# Patient Record
Sex: Male | Born: 1935
Health system: Southern US, Community
[De-identification: ages and names within clinical notes are randomized; demographics above are authoritative.]

## PROBLEM LIST (undated history)

## (undated) DIAGNOSIS — G43909 Migraine, unspecified, not intractable, without status migrainosus: Secondary | ICD-10-CM

## (undated) DIAGNOSIS — J309 Allergic rhinitis, unspecified: Secondary | ICD-10-CM

## (undated) DIAGNOSIS — Z87442 Personal history of urinary calculi: Secondary | ICD-10-CM

## (undated) DIAGNOSIS — R42 Dizziness and giddiness: Secondary | ICD-10-CM

## (undated) DIAGNOSIS — I10 Essential (primary) hypertension: Secondary | ICD-10-CM

## (undated) DIAGNOSIS — Z8719 Personal history of other diseases of the digestive system: Secondary | ICD-10-CM

## (undated) DIAGNOSIS — F329 Major depressive disorder, single episode, unspecified: Secondary | ICD-10-CM

## (undated) DIAGNOSIS — L814 Other melanin hyperpigmentation: Secondary | ICD-10-CM

## (undated) DIAGNOSIS — D229 Melanocytic nevi, unspecified: Secondary | ICD-10-CM

## (undated) DIAGNOSIS — T8859XA Other complications of anesthesia, initial encounter: Secondary | ICD-10-CM

## (undated) DIAGNOSIS — F419 Anxiety disorder, unspecified: Secondary | ICD-10-CM

## (undated) DIAGNOSIS — R350 Frequency of micturition: Secondary | ICD-10-CM

## (undated) DIAGNOSIS — F32A Depression, unspecified: Secondary | ICD-10-CM

## (undated) DIAGNOSIS — K219 Gastro-esophageal reflux disease without esophagitis: Secondary | ICD-10-CM

## (undated) DIAGNOSIS — R7989 Other specified abnormal findings of blood chemistry: Secondary | ICD-10-CM

## (undated) DIAGNOSIS — T4145XA Adverse effect of unspecified anesthetic, initial encounter: Secondary | ICD-10-CM

## (undated) DIAGNOSIS — N486 Induration penis plastica: Secondary | ICD-10-CM

## (undated) DIAGNOSIS — H409 Unspecified glaucoma: Secondary | ICD-10-CM

## (undated) DIAGNOSIS — M1990 Unspecified osteoarthritis, unspecified site: Secondary | ICD-10-CM

## (undated) DIAGNOSIS — G4700 Insomnia, unspecified: Secondary | ICD-10-CM

## (undated) DIAGNOSIS — N529 Male erectile dysfunction, unspecified: Secondary | ICD-10-CM

## (undated) DIAGNOSIS — E785 Hyperlipidemia, unspecified: Secondary | ICD-10-CM

## (undated) DIAGNOSIS — IMO0001 Reserved for inherently not codable concepts without codable children: Secondary | ICD-10-CM

## (undated) DIAGNOSIS — J189 Pneumonia, unspecified organism: Secondary | ICD-10-CM

## (undated) DIAGNOSIS — M545 Low back pain, unspecified: Secondary | ICD-10-CM

## (undated) DIAGNOSIS — E213 Hyperparathyroidism, unspecified: Secondary | ICD-10-CM

## (undated) DIAGNOSIS — J45909 Unspecified asthma, uncomplicated: Secondary | ICD-10-CM

## (undated) DIAGNOSIS — Z9289 Personal history of other medical treatment: Secondary | ICD-10-CM

## (undated) DIAGNOSIS — Z8669 Personal history of other diseases of the nervous system and sense organs: Secondary | ICD-10-CM

## (undated) DIAGNOSIS — G709 Myoneural disorder, unspecified: Secondary | ICD-10-CM

## (undated) DIAGNOSIS — Z860100 Personal history of colon polyps, unspecified: Secondary | ICD-10-CM

## (undated) DIAGNOSIS — R531 Weakness: Secondary | ICD-10-CM

## (undated) DIAGNOSIS — E119 Type 2 diabetes mellitus without complications: Secondary | ICD-10-CM

## (undated) DIAGNOSIS — G8929 Other chronic pain: Secondary | ICD-10-CM

## (undated) DIAGNOSIS — R3915 Urgency of urination: Secondary | ICD-10-CM

## (undated) DIAGNOSIS — R351 Nocturia: Secondary | ICD-10-CM

## (undated) DIAGNOSIS — G629 Polyneuropathy, unspecified: Secondary | ICD-10-CM

## (undated) DIAGNOSIS — K5900 Constipation, unspecified: Secondary | ICD-10-CM

## (undated) DIAGNOSIS — N400 Enlarged prostate without lower urinary tract symptoms: Secondary | ICD-10-CM

## (undated) HISTORY — PX: BACK SURGERY: SHX140

## (undated) HISTORY — DX: Type 2 diabetes mellitus without complications: E11.9

## (undated) HISTORY — PX: TRIGGER FINGER RELEASE: SHX641

## (undated) HISTORY — PX: COLONOSCOPY: SHX174

## (undated) HISTORY — PX: LITHOTRIPSY: SUR834

---

## 1965-10-09 DIAGNOSIS — Z8711 Personal history of peptic ulcer disease: Secondary | ICD-10-CM

## 1965-10-09 HISTORY — DX: Personal history of peptic ulcer disease: Z87.11

## 1996-10-09 HISTORY — PX: ANTERIOR CERVICAL DECOMP/DISCECTOMY FUSION: SHX1161

## 1998-05-03 ENCOUNTER — Ambulatory Visit (HOSPITAL_COMMUNITY): Admission: RE | Admit: 1998-05-03 | Discharge: 1998-05-03 | Payer: Self-pay | Admitting: Gastroenterology

## 1999-08-26 ENCOUNTER — Encounter: Payer: Self-pay | Admitting: Neurological Surgery

## 1999-08-30 ENCOUNTER — Inpatient Hospital Stay (HOSPITAL_COMMUNITY): Admission: RE | Admit: 1999-08-30 | Discharge: 1999-09-04 | Payer: Self-pay | Admitting: Neurological Surgery

## 1999-08-30 ENCOUNTER — Encounter: Payer: Self-pay | Admitting: Neurological Surgery

## 1999-09-23 ENCOUNTER — Encounter: Admission: RE | Admit: 1999-09-23 | Discharge: 1999-09-23 | Payer: Self-pay | Admitting: Neurological Surgery

## 1999-09-23 ENCOUNTER — Encounter: Payer: Self-pay | Admitting: Neurological Surgery

## 1999-10-28 ENCOUNTER — Encounter: Payer: Self-pay | Admitting: Neurological Surgery

## 1999-10-28 ENCOUNTER — Encounter: Admission: RE | Admit: 1999-10-28 | Discharge: 1999-10-28 | Payer: Self-pay | Admitting: Neurological Surgery

## 2000-02-29 ENCOUNTER — Encounter: Admission: RE | Admit: 2000-02-29 | Discharge: 2000-02-29 | Payer: Self-pay | Admitting: Neurological Surgery

## 2000-02-29 ENCOUNTER — Encounter: Payer: Self-pay | Admitting: Neurological Surgery

## 2000-06-08 ENCOUNTER — Encounter: Payer: Self-pay | Admitting: Neurological Surgery

## 2000-06-08 ENCOUNTER — Ambulatory Visit (HOSPITAL_COMMUNITY): Admission: RE | Admit: 2000-06-08 | Discharge: 2000-06-08 | Payer: Self-pay | Admitting: Neurological Surgery

## 2002-03-27 ENCOUNTER — Encounter: Admission: RE | Admit: 2002-03-27 | Discharge: 2002-03-27 | Payer: Self-pay | Admitting: Family Medicine

## 2002-03-27 ENCOUNTER — Encounter: Payer: Self-pay | Admitting: Family Medicine

## 2002-04-21 ENCOUNTER — Encounter: Admission: RE | Admit: 2002-04-21 | Discharge: 2002-04-21 | Payer: Self-pay | Admitting: Family Medicine

## 2002-04-21 ENCOUNTER — Encounter: Payer: Self-pay | Admitting: Family Medicine

## 2002-05-05 ENCOUNTER — Encounter: Admission: RE | Admit: 2002-05-05 | Discharge: 2002-05-05 | Payer: Self-pay | Admitting: Family Medicine

## 2002-05-05 ENCOUNTER — Encounter: Payer: Self-pay | Admitting: Family Medicine

## 2002-09-09 ENCOUNTER — Encounter: Payer: Self-pay | Admitting: Neurological Surgery

## 2002-09-11 ENCOUNTER — Encounter: Payer: Self-pay | Admitting: Neurological Surgery

## 2002-09-11 ENCOUNTER — Inpatient Hospital Stay (HOSPITAL_COMMUNITY): Admission: RE | Admit: 2002-09-11 | Discharge: 2002-09-15 | Payer: Self-pay | Admitting: Neurological Surgery

## 2003-07-23 ENCOUNTER — Ambulatory Visit (HOSPITAL_COMMUNITY): Admission: RE | Admit: 2003-07-23 | Discharge: 2003-07-23 | Payer: Self-pay | Admitting: Urology

## 2006-03-01 ENCOUNTER — Inpatient Hospital Stay (HOSPITAL_COMMUNITY): Admission: RE | Admit: 2006-03-01 | Discharge: 2006-03-03 | Payer: Self-pay | Admitting: Neurological Surgery

## 2006-10-09 HISTORY — PX: EYE SURGERY: SHX253

## 2007-10-10 HISTORY — PX: HEMORRHOID SURGERY: SHX153

## 2008-08-28 ENCOUNTER — Ambulatory Visit (HOSPITAL_COMMUNITY): Admission: RE | Admit: 2008-08-28 | Discharge: 2008-08-28 | Payer: Self-pay | Admitting: *Deleted

## 2008-08-28 ENCOUNTER — Encounter (INDEPENDENT_AMBULATORY_CARE_PROVIDER_SITE_OTHER): Payer: Self-pay | Admitting: *Deleted

## 2008-09-02 ENCOUNTER — Inpatient Hospital Stay (HOSPITAL_COMMUNITY): Admission: AD | Admit: 2008-09-02 | Discharge: 2008-09-04 | Payer: Self-pay

## 2008-10-09 DIAGNOSIS — Z9289 Personal history of other medical treatment: Secondary | ICD-10-CM

## 2008-10-09 HISTORY — DX: Personal history of other medical treatment: Z92.89

## 2009-12-21 ENCOUNTER — Ambulatory Visit (HOSPITAL_BASED_OUTPATIENT_CLINIC_OR_DEPARTMENT_OTHER): Admission: RE | Admit: 2009-12-21 | Discharge: 2009-12-21 | Payer: Self-pay | Admitting: Orthopedic Surgery

## 2011-02-21 NOTE — Op Note (Signed)
NAME:  Seth Schwartz, WANT NO.:  [id number]   MEDICAL RECORD NO.:  [id number]          PATIENT TYPE:  AMB   LOCATION:  DAY                          FACILITY:  [facility]   PHYSICIAN:  Alfonse Ras, MD   DATE OF BIRTH:  05/24/1936   DATE OF PROCEDURE:  08/28/2008  DATE OF DISCHARGE:                               OPERATIVE REPORT   PREOPERATIVE DIAGNOSES:  Grade 2 internal hemorrhoids with prolapse  which spontaneously reduce and perianal pain.   POSTOPERATIVE DIAGNOSIS:  Grade 2 internal hemorrhoids with prolapse  which spontaneously reduce and perianal pain with very high internal  sphincter muscle tone.   PROCEDURE:  PPH hemorrhoidectomy, rectopexy.   ANESTHESIA:  General.   SURGEON:  Alfonse Ras, MD   ASSISTANT:  Lennie Muckle, MD.   DESCRIPTION:  The patient was taken to the operating room after informed  consent was granted.  The indication, risks, benefits, and options were  explained to the patient.  He underwent general anesthesia by  endotracheal tube, and was placed in the prone jackknife position.  Rectal and perianal prep were undertaken with Betadine solution.  Dilatation was accomplished to 3 fingerbreadths, although this was quite  difficult because of the patient's high underlying sphincter tone.  Hemorrhoidal bundles were injected with 0.25 Marcaine with Wydase.  Internal and external sphincter muscles were injected with additional  0.25% Marcaine, total of 30 mL.  A 2-0 Prolene pursestring suture was  placed in mucosa and submucosa circumferentially approximately 4 to 5 cm  proximal to the dentate line.  The stapler was then introduced, and the  pursestring suture was tied down around the anvil.  It was then closed  and held in place for 1 minute.  It was fired.  The stapler was removed,  and there was a good rim of mucosa and submucosa including hemorrhoidal  tissue.  Inspecting the staple line, there was no bleeding.  However, in  the  right lateral aspect, it appeared to be only about 1/2 cm from the  dentate line.  The remainder of the staple line was 2 to 2-1/2 cm from  the dentate line.  Bupivacaine ointment was then placed, and Gelfoam  packing was placed in the rectum after adequate hemostasis was assured.  A 4 x 4s were placed.  Mesh panties were placed on the patient, and he  was taken to PACU in good condition.      Alfonse Ras, MD  Electronically Signed     KRE/MEDQ  D:  08/28/2008  T:  08/28/2008  Job:  [id number]

## 2011-02-21 NOTE — Op Note (Signed)
NAME:  Seth Schwartz, Seth Schwartz NO.:  [id number]   MEDICAL RECORD NO.:  [id number]          PATIENT TYPE:  INP   LOCATION:  5154                         FACILITY:  [facility]   PHYSICIAN:  Lennie Muckle, MD      DATE OF BIRTH:  Apr 07, 1936   DATE OF PROCEDURE:  09/02/2008  DATE OF DISCHARGE:                               OPERATIVE REPORT   PREOPERATIVE DIAGNOSIS:  Rectal bleeding.   POSTOPERATIVE DIAGNOSIS:  Rectal bleeding.   PROCEDURE:  Rectal exam under anesthesia with control of bleeding.   SURGEON:  Lennie Muckle, MD   ASSISTANT:  None.   ANESTHESIA:  General endotracheal anesthesia.   FINDINGS:  Mild oozing on staple line around 5 o'clock, secured with a 2-  0 chromic and electrocautery.   INDICATION FOR PROCEDURE:  [name] was a 75 year old male who had a  TPH performed last on August 28, 2008.  He had pain since his surgery.  He has had bleeding in the past couple of days, had unable to have a  good bowel movement secondary to pain.  He was seen in our office and  had some bleeding noted, therefore, it was discussed the patient needed  to have examination under anesthesia for the control of bleeding.  Informed consent was obtained prior to procedure.   [name] was identified in the preoperative holding area.  He was  then taken to operating room and placed in the supine position.  After  administration of general endotracheal anesthesia, Foley catheter was  placed.  He was then turned into a prone position.  His perineum was  prepped by me.  I did prep the anus.  After prepping and draping the  perineum, I placed a small anoscope into the anus.  Upon inspection,  there was a mild bit of oozing noted around 4-5 o'clock.  I was able to  fully control with electrocautery.  Therefore, I placed 2 sutures with a  2-0 chromic suture that seemed to control the oozing.  I further  inspected the staple line.  There was some breakdown approximately 3  o'clock,  which I was able to control with electrocautery.  The other  portion of the staple line appeared intact and there was no bleeding.  There was stool noted superiorly to the staple line.  I did inject 0.25%  Marcaine a total of 50 mL some directly into the portion around 5-6  o'clock, and around the perineum in all 4 quadrants.  Final inspection  revealed no significant amount of bleeding.  I therefore placed a  Gelfoam and gauze within the anus.  I Placed dibucaine ointment around  the perineum.  The patient was then turned into supine position,  extubated and transported to postanesthesia care unit in stable  condition.  He will be monitored overnight.  He was started on  Percocet and given Dilaudid for breakthrough pain.  He will be given  MiraLax, Dulcolax, and Colace to aid with stool softening.  His Foley  catheter will be discharged in the morning.  He will be likely to be  able to be discharged home tomorrow.      Lennie Muckle, MD  Electronically Signed     ALA/MEDQ  D:  09/02/2008  T:  09/03/2008  Job:  [id number]   cc:   Alfonse Ras, MD

## 2011-02-24 NOTE — H&P (Signed)
[place]. [facility]  Patient:    Seth Schwartz                      MRN: [id number] Adm. Date:  [id number] Attending:  Jonne Ply                         History and Physical  ADMISSION DIAGNOSES:  Lumbar spondylosis plus herniated nucleus pulposus with left lumbar radiculopathy.  HISTORY OF PRESENT ILLNESS:  The patient is a 75 year old individual whom I have seen and taken care of in the past.  In 1998, he had cervical spondylosis with myelopathy, which was decompressed and stabilized.  He made a good recovery and was able to return to the work place; however, over the past number of months he has had some intermittent bouts of back pain and leg pain.  I had seen him in January for difficulties with some pain radiating into his right lower extremity.  He started on some non-steroidal anti-inflammatories and underwent some conservative treatment, which he seemed to respond to.  He was then seen again in February, while he was improving.  He called me several weeks ago and was noted to have a  severe worsening of pain with weakness in his lower extremities, to the point where he could not get out of bed without great difficulty.  He had missed a number of days from work and I advised an MRI scan.  He was seen on August 16, 1999 with the study in hand and we reviewed it.  Compared to his previous study, he had a marked worsening of spondylitic degeneration at L4-5 with a central and a left-sided disk protrusion, which had enlarged considerably.  He had bilateral dorsiflexor weakness, and because of his poor level of functioning and severe pain that he as experiencing he was advised regarding surgical decompression and stabilization.  He is now admitted for this procedure.  PAST MEDICAL HISTORY:  His general health has been very good.  The only other surgery has been a hemorrhoidectomy, aside from the cervical  spondylitic surgery.  CURRENT MEDICATIONS: 1. BuSpar 5 mg three times a day for anxiety. 2. Trazodone 75 mg at bedtime for sleep. 3. Hydrocodone, which he had been using until the recent past.  ALLERGIES: 1. PENICILLIN. 2. SULFA. 3. Possibly CODEINE.  FAMILY HISTORY:  His mother is age 59 in good health.  Father is age 31 in fair  health.  There is a history of colon cancer in his mother.  SOCIAL HISTORY:  The patient is married.  He has grown children.  He works as a  Production designer, theatre/television/film at [employer].  REVIEW OF SYSTEMS:  Essentially negative for difficulties in the GI, respiratory, GU, and cardiovascular systems.  PHYSICAL EXAMINATION:  GENERAL:  He is an alert, oriented, cooperative individual in no overt distress.  NEUROLOGIC:  He will stand straight and erect with great difficulty.  He tends o favor a 10-degree forward flex.  He extends to the straight and upright position but not beyond.  Motor strength reveals bilateral tibialis anterior, extensor hallucis weakness to 4/5.  The patient complains of pain in the region of both anterior tibial muscular regions.  His gastrocnemius strength appears normal. Iliopsoas and quadriceps strength are also normal.  Deep tendon reflexes are 2+ in the patellae, trace in both Achilles.  Babinskis are downgoing.  Straight leg raising is markedly positive in either lower  extremity at 45 degrees.  Patricks  maneuver is negative bilaterally.  Palpation of his back reproduces acute tenderness over the region of L4-5 in the midline.  There is a marked amount of  paravertebral spasm.  Sensation is diminished over the dorsum of both feet to pin and vibration.  In the upper extremities, strength and reflexes are normal with 1+ biceps reflexes and trace triceps reflexes.  Cranial nerve examination reveals hat pupils are 3 mm, brisk, reactive to light and accommodation.  Extraocular movements are full.  The face is symmetric to  grimace.  Tongue and uvula are in the midline. Sclerae and conjunctivae are clear.  LUNGS:  Clear to auscultation.  HEART:  Regular rate and rhythm.  ABDOMEN:  Soft.  Bowel sounds are positive.  No masses are palpable.  EXTREMITIES:  No cyanosis, clubbing, or edema.  IMPRESSION:  The patient has evidence of spondylitic disease of the lumbar spine with a large protruding disk at L4-5.  Because of the nature of the degeneration, he has been advised that, in addition to the decompression, he should undergo a  stabilization procedure and he is now admitted for this procedure. DD:  08/30/99 TD:  08/30/99 Job: [id number]

## 2011-02-24 NOTE — Op Note (Signed)
[place]. [facility]  Patient:    FAYEZ STURGELL                      MRN: [id number] Proc. Date: 08/30/99 Adm. Date:  [id number] Attending:  Jonne Ply                           Operative Report  PREOPERATIVE DIAGNOSES:  L4-5 herniated nucleus pulposus plus spondylosis with severe lumbar radiculopathy, and bilateral foot drops.  POSTOPERATIVE DIAGNOSES:  L4-5 herniated nucleus pulposus plus spondylosis with  severe lumbar radiculopathy, and bilateral foot drops.  PROCEDURE:  L4-5 bilateral diskectomy, posterior interbody arthrodesis with Ray  cage technique, local autograft.  Bilateral lateral arthrodesis with iliac crest bone graft from separate incision and pedicular fixation with SDRS system using  spiral 90 locks.  SURGEON:  Stefani Dama, M.D.  ASSISTANT:  Danae Orleans. Venetia Maxon, M.D.  ANESTHESIA:  General endotracheal.  INDICATIONS:  The patient is a 75 year old individual who has had significant back and bilateral leg pain that started rather acutely.  He had been seen a number f months ago with an episode of back pain and some lumbar radiculopathy that resolved with conservative management; however, a little over a week ago he had an MRI scan which showed a large central herniation of the L4-5 disk which had been markedly degenerated.  He had bilateral foot drops.  He was taken to the operating room.  DESCRIPTION OF PROCEDURE:  The patient was brought to the operating room supine on the stretcher.  After a smooth induction of general endotracheal anesthesia he as turned prone.  The back was shaved and prepped with DuraPrep and draped in a sterile fashion.  A midline incision was created and carried down to the lumbar  dorsal fascia.  The fascia was opened off to the left side of the midline and the posterior superior iliac crest was identified.  The outer table of the crest was incised and the gluteus muscle was stripped  from the outer table of the ileum. A Taylor retractor was placed into the wound.  Then using a combination of small nd large osteotomes, cortical cancellus strips of bone were harvested from the posterior superior iliac crest.  The underlying cancellus bone was harvested using curved gouges.  An adequate sample of bone was obtained.  The fascia overlying he iliac crest was closed with #1 Vicryl in an interrupted fashion.  Attention was then turned back to the main portion of the surgery, where the spinous process of L4 was identified positively.  L4-5 was then dissected out in a subperiosteal fashion out to the facet joint at L4-5, and then the transverse processes of L4 are identified superiorly, and L5 inferiorly, again dissecting n a subperiosteal fashion.  The lateral gutters were then packed.  During this time  then laminotomies were created, removing the inferior margin and lamina of L4 out to and including the facet joint at L4-5.  The bone was harvested and saved for  bone graft for later usage.  The superior facets were then decorticated of any cartilaginous surfaces.  The yellow ligament was noted to be thickened and redundant, and this was taken up, uncovering the common dural tube.  The tube appeared to be dented dorsally, and by carefully dissecting along the lateral borders, a large free fragment of disk was encountered centrally.  This was resected.  The opening was clear into  the disk space, and this was then enlarged a little further with a #15 blade from the right side primarily, and a diskectomy was completed.  The disk space was markedly degenerated.  A combination of curets and rongeurs were used to evacuate the disk of a significant quantity of markedly degenerated disk material, so that a complete diskectomy was ultimately performed. A small laminar spreader was then used to separate the interlaminar space and then it was identified that an interbody  procedure could be performed by placing a 16 mm drill, and then tapped into the interspace, first on the right side.  A 16 mm x 26 mm cage was then threaded into the interspace, after the surfaces were adequately decorticated, first on the right side.  Then the procedure was completed on the  left side.  With this being performed, pedicle entry sites were chosen at L4 and L5 bilaterally.  These were marked with probes.  A localizing radiograph then identified and checked the position of the cages, and the position of the probes. Then the lateral gutters which had been previously dissected out were decorticated in a subperiosteal fashion.  A bone graft from the iliac crest that had been harvested was packed using first the cancellus bone and then the cortical cancellus strips over this.  With both lateral gutters packed, the pedicle probes were removed, and the screws were placed using 5.75 mm x 45.0 mm screws throughout L4 and L5.  The junction between the facet joint and the lateral mass was then carefully dissected out so that the screw heads at L4 would not interfere with his joint, and with the screws being placed, 45.0 mm rods were placed between the screw head.  These were contoured slightly, and then locking caps were placed in position.  A final localizing radiograph identified good position of the screws. Cages were packed with cortical cancellus bone that had been harvested from the  laminectomies.  Plastic end caps were applied.  Two fat grafts which were harvested from the subcutaneous space were then placed over each laminotomy site, to protect the L4 nerve root superiorly and the L5 nerve root inferiorly.  The area was copiously irrigated with antibiotic irrigating solution.  The lumbar dorsal fascia was then closed with #1 Vicryl and PDS in an interrupted fashion, and #2-0 Vicryl was used subcutaneously, #3-0 Vicryl subcuticularly.  A clear plastic  dressing as placed on the patients back.  The blood loss was estimated at about 600 cc. DD:  08/30/99 TD:  09/01/99 Job: [id number]

## 2011-02-24 NOTE — Op Note (Signed)
NAME:  Seth Schwartz, Seth Schwartz NO.:  [id number]   MEDICAL RECORD NO.:  [id number]                   PATIENT TYPE:  INP   LOCATION:  3172                                 FACILITY:  [facility]   PHYSICIAN:  Stefani Dama, M.D.               DATE OF BIRTH:  06/22/1936   DATE OF PROCEDURE:  09/11/2002  DATE OF DISCHARGE:                                 OPERATIVE REPORT   PREOPERATIVE DIAGNOSIS:  L3-4 herniated nucleus pulposus plus spondylosis,  lumbar radiculopathy, status post arthrodesis L4 and L5.   POSTOPERATIVE DIAGNOSIS:  L3-4 herniated nucleus pulposus plus spondylosis,  lumbar radiculopathy, status post arthrodesis L4 and L5.   OPERATION PERFORMED:  Removal of infected hardware at L4-L5.  Decompression  L3-4 with total diskectomy, posterior interbody arthrodesis using Ray cage  technique and pedicle screw fixation, L3-L4 with Synthes Clix pedicle screw  system.   SURGEON:  Stefani Dama, M.D.   ASSISTANT:  Clydene Fake, M.D.   ANESTHESIA:  General endotracheal.   INDICATIONS FOR PROCEDURE:  The patient is a 75 year old individual who  underwent lumbar fusion at the L4-L5 level secondary to degenerative changes  and radiculopathy in November 1998.  He did well in the interim.  However,  he has developed significant persistent right lower extremity pain in the  region of the quadriceps.  After careful evaluation it was noted that he had  a laterally extruded disk at the L3-L4 level. He was advised regarding  surgical decompression of that region via a posterior approach.  The patient  was advised that his previous hardware would be removed and he would undergo  a similar fusion at the level of L3-L4.   DESCRIPTION OF PROCEDURE:  The patient was brought to the operating room  supine on a stretcher.  After smooth induction of general endotracheal  anesthesia, he was turned prone.  The back was shaved, prepped with DuraPrep  and draped in a sterile  fashion.  A midline incision was created in the bed  of the previous incision and this was carried cephalad approximately 3 cm.  The lumbodorsal fascia was then dissected on either side of the midline.  The first mobile spinous process was then identified and this was identified  then as the L3-L4 interspace.  Dissection was then carried out laterally to  identify the previously placed hardware at L4-L5.  The screw caps were  identified and stripped of the fascial tissue that had grown up around them.  A self-retaining retractor was placed in the wound.  After loosening the  screw heads, the rods were removed and the individual screws were removed.  These were spiral 9 degree screws.  The arthrodesis was inspected and noted  to be fused solidly at L4-L5.  At this point then laminotomies were created  at L3-L4 removing the entirety of the inferior facet from L3.  The common  dural tube  was explored.  The lateral recess was explored.  The L4 nerve  root was identified, retracted medially along with the dura expose the disk  space at L3-L4.  A diskectomy was then performed first on the left side and  then a similar procedure was carried out on the right side.  This was done  with the help of Dr. Orbie Hurst who provided retraction of the common dural  tube and the L4 nerve root so as to protect it while the diskectomy was  underway, with him doing some of the lateral dissection and me doing the  medial dissection.  Then once the total diskectomy was completed, a  combination of curets and rongeurs was used to decorticate the surface of  the end plates adequately.  These were from the Synthes PLIF set.  A self-  retaining disk spreader was placed in the interspace to widen the opening,  then by sizing the appropriate Ray cage spacers, a 14 mm Tang could be  placed.  However, it was felt that it would put too much traction on the L4  nerve root.  Then using a free-hand approach, the interspace was  drilled  first on the left side and then tapped and then a 14 mm cage was placed  after some bone graft was placed into the lateral recesses of the disk  space.  Care was taken during the procedure to make sure that the right-  sided extraforaminal space was well decompressed of disk material and this  was the case.  With the distractor on the opposite side then, a 14 mm cage  was placed on the left side and countersunk approximately 4 mm.  The right-  sided cage was then similarly drilled and placed with bone graft being  placed into the interspace first and then the cage being placed with a  combination of the patient's own bone graft and 10 cc of Vitoss bone  expander.  The plastic end caps were applied.  This procedure was done with  the help of Dr. Phoebe Perch who provided retraction of the dura and protection of  the nerve roots while dissection and implantation of the cages was  performed.  Then we decided to place pedicle screws in the L3 vertebrae.  Pedicle entry sites were chosen at the intersection of the transverse  process and the pars.  Probes were placed and localizing radiograph  identified good position of the probes and good position of the interbody  cages.  The superior holes were then drilled and tapped with 6.2 mm taps and  40 mm screws were placed on each side.  The screw heads were reamed  appropriately and caps were applied.  Prior to placing the caps, the lateral  gutters were packed with a mixture of autograft and allograft after  decorticating the transverse processes at L3 and L4 adequately.  The caps  were applied and short rods measuring 55 mm were placed between these  pedicle screws and tightened into position in the cephalad plane.  The right  side was performed first and then the left side was similarly grasped and  fastened.  A localizing radiograph was again obtained and identified good position of the hardware and final tightening was then performed.  The  area  was then explored further and care was taken to make sure that the L3 nerve  root superiorly and the L4 nerve roots inferiorly were well decompressed.  A  small piece of fascia was placed  over the Ray cage to isolate it from the  surrounding nerve root region, then by irrigating copiously with antibiotic  irrigating solution, the wound was checked for hemostasis.  The lumbodorsal  fascia was closed with #1 Vicryl in interrupted fashion and 2-0 Vicryl was  used in the subcutaneous and subcuticular tissues.  3-0 Vicryl was used as a  final subcuticular closure and Dermabond was placed on the skin.  Blood loss  was estimated at about 1200 cc.  One unit of Cell Saver blood was returned  to the patient.                                                Stefani Dama, M.D.    Merla Riches  D:  09/11/2002  T:  09/11/2002  Job:  [id number]

## 2011-02-24 NOTE — Discharge Summary (Signed)
[place]. [facility]  Patient:    Seth Schwartz                      MRN: [id number] Adm. Date:  [id number] Disc. Date: 09/04/99 Attending:  Jonne Ply                           Discharge Summary  REASON FOR ADMISSION:  L4-5 spondylosis, herniated disk and radiculopathy.  FINAL DIAGNOSIS:  L4-5 spondylosis, herniated disk and radiculopathy.  HISTORY OF PRESENT ILLNESS:  Seth Schwartz is a 75 year old man with bilateral L5 radiculopathies with a large central L4-5 disk herniation and significant spondylosis.  He was admitted to the hospital and underwent bilateral diskectomy and posterior lumbar interbody fusion with Ray cages and supplemental fixation ith SVRS and pedicle fixation system, L4 through L5 and local bone iliac crest bone  graft for supplemental fixation.  The patient tolerated surgery well. Postoperatively he had good strength in his lower extremities.   He was mobilized gradually, initially observed in the ICU, then transferred to the regular floor. On November 25 he was still slow to be up and walking, this improved by November 26.  He was complaining of some mild dysuria and UA was sent.  He was started on ciprofloxacin 500 mg b.i.d. x 7 days and given prescriptions for Percocet and Valium.  DISCHARGE INSTRUCTIONS:  No lifting, bending, twisting or driving.  Wear brace hen up.  Call Monday for an appointment with  Dr. Danielle Dess for followup. DD:  09/04/99 TD:  09/04/99 Job: [id number]

## 2011-02-24 NOTE — Discharge Summary (Signed)
NAME:  Seth Schwartz, Seth Schwartz NO.:  [id number]   MEDICAL RECORD NO.:  [id number]          PATIENT TYPE:  INP   LOCATION:  5154                         FACILITY:  [facility]   PHYSICIAN:  Ardeth Sportsman, MD     DATE OF BIRTH:  [birth year]   DATE OF ADMISSION:  09/02/2008  DATE OF DISCHARGE:  09/04/2008                               DISCHARGE SUMMARY   CHIEF COMPLAINT AND REASON FOR ADMISSION:  [name] is a 75 year old  male patient, recently had a PPH procedure done on November 20, had  intractable pain since surgery, had developed postoperative bleeding and  inability to pass a bowel movement, was seen in the office, was noted to  have bleeding on exam.  Therefore, the patient was admitted to [facility] for examination under anesthesia to determine if additional  control bleeding was indicated.   The patient was admitted to the hospital where he was subsequently taken  to the OR by Dr. Freida Busman where he underwent a rectal exam under anesthesia  to control bleeding.  Please refer to Dr. Eliot Ford operative note for  details.  Basically, there are several areas of oozing which were  controlled with electrocautery procedure.   In the postoperative period, the patient was sent back to the general  floor.  He continue to have rectal pain but was only passing old blood  clots.  No signs of infection.  No frank red blood.  Stool softeners  were added.  He was given and anxiety medicine Valium and pain  medications were initiated.  By postop day #2 of the second procedure,  the patient reported rectal pain much better.  He was tolerating a diet  and oral pain medications, passing BMs without significant pain, and was  requesting to go home.  On rectal exam, he had mild perirectal  excoriation, otherwise rectal exam was normal.   DISCHARGE DIAGNOSES:  1. Postoperative rectal oozing and pain after PPH procedure.  2. Status post rectal exam under anesthesia with electrocautery   control of rectal oozing.   DISCHARGE MEDICATIONS:  The patient will resume the following home  medications:  1. Trazodone 100 mg at bedtime.  2. Omeprazole 20 mg daily.  3. Zocor 40 mg daily.  4. Lisinopril 5 mg daily.  5. Depo-Testosterone  250 mg monthly injectable.  6. Hydrocodone to be stopped.  7. Diazepam 5 mg 1-2 tablets as needed p.r.n. anxiety.  8. Cyclobenzaprine 10 mg p.r.n. if necessary take 10 mg daily.   NEW MEDICATIONS:  1. Metamucil as you were taking, before this is over-the-counter      medicine.  2. Over-the-counter stool softener 3 times daily, decrease to 2 times      daily if you have loose stools.  If still passing loose stools      after that, change in dosage over 3-day period, decreased to once      daily.  If still having loose stools, stop.  3. Percocet 5/325 one to two tablets every 4 hours as needed for pain.  4. Naprosyn 500 mg b.i.d. for pain.  OTHER DISCHARGE INSTRUCTIONS:  Diet; no restrictions.  Wound care; sitz  bath or shower 2-4 times daily.  Use baby wipes on the rectum.  Activity; no restrictions.   FOLLOWUP APPOINTMENTS:  You need to keep your previously scheduled  appointment with Dr. Colin Benton, your initial operative physician.      Allison L. Eliott Nine, MD  Electronically Signed    ALE/MEDQ  D:  10/14/2008  T:  10/15/2008  Job:  [id number]   cc:   Lennie Muckle, MD  Alfonse Ras, MD

## 2011-02-24 NOTE — Op Note (Signed)
NAME:  Seth Schwartz, Seth Schwartz NO.:  [id number]   MEDICAL RECORD NO.:  [id number]          PATIENT TYPE:  INP   LOCATION:  2899                         FACILITY:  [facility]   PHYSICIAN:  Stefani Dama, M.D.  DATE OF BIRTH:  1935-11-22   DATE OF PROCEDURE:  03/01/2006  DATE OF DISCHARGE:                                 OPERATIVE REPORT   PREOPERATIVE DIAGNOSIS:  Lumbar spondylosis, herniated nucleus pulposus with  spondylolisthesis, L2-3,lumbar radiculopathy.   POSTOPERATIVE DIAGNOSIS:  Lumbar spondylosis, herniated nucleus pulposus  with spondylolisthesis, L2-3,lumbar radiculopathy.   PROCEDURE:  Decompression L2-3 with bilateral diskectomy, posterior  interbody arthrodesis using PEEK spacers, pedicle screw fixation L2-L3,  removal of hardware L3-L4.   SURGEON:  Stefani Dama, M.D.   FIRST ASSISTANT:  Dr. Trey Sailors   ANESTHESIA:  General endotracheal.   INDICATIONS:  Seth Schwartz is a 75 year old individual who has had  significant back and bilateral lower extremity pain that is worse in the  past several months and has degenerative disk disease at the L2-L3 level  with a central herniation, spondylolisthesis and bilateral nerve root  compromise.  He has been advised regarding surgical decompression.   PROCEDURE:  The patient was brought to the operating room supine on the  stretcher, after smooth induction of general endotracheal anesthesia he was  turned prone.  The back was prepped with DuraPrep and draped in sterile  fashion.  Midline incision was created and carried down to the lumbodorsal  fascia.  The fascia was opened on either side of midline to expose  previously placed hardware.  The screws were removed at L3 and L4 and the  intervening rods were also removed.  The area was explored and then  bilateral laminotomies were created removing the inferior margin of lamina  of L2 out to and including the entirety of the facet.  The facet was then  explored  and the superior portion of naked facet at L3 was then  decorticated.  Lateral dissection was taken out to expose common dural tube  and the nerve root was noted to be significantly fibrosed to the underlying  herniated disk.  This was noted to have occurred bilaterally.  With gentle  dissection using loupe dissection, the root could be mobilized and so could  the common dural tube and diskectomy was then undertaken removing the  entirety of disk at L2-3 using combination of Kerrison rongeurs.  The  endplates were then rongeured down and the disk shapers were used to smooth  out the endplates of the disk space and ultimately an 11 mm PEEK cage could  be placed into the interspace after it was packed with the patient's  autologous bone that was mixed with allograft and Osteocel material.  The  disk space was then fully packed with this material.  Then using  fluoroscopy, pedicle entry sites were chosen at the L2 vertebra, 6.2 x 45 mm  pedicle screws were placed in L2, 6.25 x 45 mm pedicle screws were placed in  L3.  Short rods were used to connect these.  Final localizing radiographs  were  obtained in the AP lateral injection, identified good position and  construct.  Then by decorticating the posterolateral aspects over the naked  facets and the intertransverse space at L2, this area was packed with bone  graft that remained including the Osteocel material.  The system was then  torqued down into final locking position with neutral position being chosen.  The bone graft was then packed laterally and  the wound was checked for hemostasis.  Lumbodorsal fascia was then closed  with #1 Vicryl, 2-0 Vicryl was used in subcutaneous tissues, 3-0 Vicryl  subcuticularly and Dermabond on the skin.  The patient tolerated procedure  well and was returned to recovery in stable condition.      Stefani Dama, M.D.  Electronically Signed     HJE/MEDQ  D:  03/01/2006  T:  03/02/2006  Job:  [id number]

## 2011-02-24 NOTE — H&P (Signed)
NAME:  Seth, Schwartz NO.:  [id number]   MEDICAL RECORD NO.:  [id number]          PATIENT TYPE:  INP   LOCATION:  2899                         FACILITY:  [facility]   PHYSICIAN:  Stefani Dama, M.D.  DATE OF BIRTH:  1936-08-29   DATE OF ADMISSION:  03/01/2006  DATE OF DISCHARGE:                                HISTORY & PHYSICAL   ADMISSION DIAGNOSIS:  Lumbar spondylosis L2-L3 with lumbar radiculopathy,  degenerative spondylolisthesis.   HISTORY OF PRESENT ILLNESS:  Seth Schwartz is a 75 year old individual who  has had previous problems with his back having had a arthrodesis at L4 to  sacrum initially and then having degenerated the L3-L4 level.  He underwent  arthrodesis at that level several years ago.  He did well after that  surgery, but here in the past several months, he has developed increasing  pain is back, radiation into his proximal lower extremities.  Plain  radiographs were performed the office in this demonstrated that he had  developed marked degeneration of the L2-L3 level with a degenerative  listhesis.  The patient was treated initially with some epidural steroids.  He does not previously respond well to these and he desired to proceed with  surgical intervention at the earliest convenience.  He had spondylitic  stenosis at the L2-L3 level.  L1-L2 above this level appears to be quite  healthy.  I discussed with him the surgical options again, the patient again  desires to proceed with surgical intervention.  He is now being admitted for  this procedure.   PAST MEDICAL HISTORY:  His general health has been good and has had some  other surgical procedures including lumbar decompression and fusion L4 to  sacrum and L3-L4 more recently.  He has had some hemorrhoidectomy in the  past.   MEDICATIONS:  BuSpar and trazodone for anxiety at night.  He has been using  hydrocodone for pain control.   ALLERGIES:  PENICILLIN, SULFA and possibly  CODEINE.   FAMILY HISTORY:  Reveals his mother is well into her 6s.  They had been in  good health without any significant medical problems.   SOCIAL HISTORY:  The patient is married.  He was retired from [employer].   REVIEW OF SYSTEMS:  A systems review is notable for a symptoms of back pain  primarily.  He denies any significant symptoms and weakness in the lower  extremity.  Bowel and bladder control has not been affected.  His  hematologic, neurologic, pulmonary, GI, musculoskeletal systems otherwise  has been a stable and the patient does have moderate amount of a anxiety.   PHYSICAL EXAMINATION:  GENERAL:  He is alert, oriented, cooperative  individual in no obvious distress.  NEUROLOGIC:  Range of motion in his back reveals that he can flex for only  45 degrees.  He extends 10 degrees.  Motor strength has been good in  iliopsoas, quad, tibialis anterior and gastroc.  Deep tendon reflexes are 2+  in the patellae, 2+ the Achilles.  Babinski's are downgoing.  Sensation is  intact in the distal lower  extremities to pin and light touch.  Vibratory  sensation is intact. The upper extremity strength is good in the deltoids,  biceps, triceps, grips and intrinsics.  The neck reveals normal range of  motion.  Upper extremity strength is normal.  Reflexes are 2+ in the biceps  and triceps, 1+ in the brachioradialis.  Sensation is intact in upper  extremities.  Cranial nerve examination reveals the pupils are 4 mm, briskly  reactive to light and accommodation.  Extraocular movements are full.  Face  symmetric to grimace.  Tongue and uvula midline. Sclerae and conjunctivae  are clear.  LUNGS:  Lungs are clear to auscultation.  CARDIOVASCULAR:  Heart is regular rate and rhythm.  ABDOMEN:  Soft.  Bowel sounds positive.  No masses are palpable.  EXTREMITIES:  Reveal no cyanosis, clubbing or edema.   IMPRESSION:  The patient has evidence of spondylitic degeneration at the L2-   L3 level.  He is now being admitted to undergo surgical decompression at  that level.      Stefani Dama, M.D.  Electronically Signed     HJE/MEDQ  D:  03/01/2006  T:  03/01/2006  Job:  [id number]

## 2011-02-24 NOTE — Discharge Summary (Signed)
   NAME:  Seth Schwartz, Seth Schwartz NO.:  [id number]   MEDICAL RECORD NO.:  [id number]                   PATIENT TYPE:  INP   LOCATION:  3007                                 FACILITY:  [facility]   PHYSICIAN:  Stefani Dama, M.D.               DATE OF BIRTH:  [birth year]   DATE OF ADMISSION:  09/11/2002  DATE OF DISCHARGE:  09/15/2002                                 DISCHARGE SUMMARY   ADMISSION DIAGNOSES:  1. Herniated nucleus pulposus L3-4.  2. Spondylosis.  3. Lumbar radiculopathy.  4. Status post arthrodesis L4-L5.   DISCHARGE DIAGNOSES:  1. Herniated nucleus pulposus L3-4.  2. Spondylosis.  3. Lumbar radiculopathy.  4. Status post arthrodesis L4-L5.  5. Urinary retention.   CONDITION ON DISCHARGE:  Improving.   HOSPITAL COURSE:  The patient is a 75 year old individual who had lumbar  fusion at L4-L5 secondary to degenerative processes in November 1998.  He  did well from that surgery; however, over the past month, he developed a  left lumbar radiculopathy seemingly at L3 distribution.  He was found to  have a large extraforaminal disk herniation at L3-4 with some degenerative  spondylitic changes.  He is advised, in addition to surgical decompression  of this area in the extraforaminal space, he requires stabilization.  He was  taken to the operating room on 09/11/2002 and underwent posterior interbody  arthrodesis after undergoing a total diskectomy and Ray cage arthrodesis.   His postoperative course has been fairly smooth.  He had a Foley placed  because of previous history of urinary retention.  This did not seem to be a  problem after the Foley catheter was removed.  He has been ambulatory and  functioning independently.  His bowels have not moved as of this time, but  he feels ready to go home.  I will see him in the office in two to three  weeks' time.  His incision is clean and dry.                                                Stefani Dama, M.D.    Merla Riches  D:  09/15/2002  T:  09/15/2002  Job:  [id number]

## 2011-07-11 LAB — BASIC METABOLIC PANEL
BUN: 14
BUN: 18
CO2: 29
CO2: 32
Calcium: 10.1
Calcium: 10.2
Chloride: 107
Chloride: 107
Creatinine, Ser: 1
Creatinine, Ser: 1.1
GFR calc Af Amer: >60
GFR calc Af Amer: >60
GFR calc non Af Amer: >60
GFR calc non Af Amer: >60
Glucose, Bld: 90
Glucose, Bld: 115 — ABNORMAL HIGH
Potassium: 4.2
Potassium: 5.2 — ABNORMAL HIGH
Sodium: 142
Sodium: 144

## 2011-07-11 LAB — CBC
HCT: 45.3
HCT: 49.5
Hemoglobin: 15.1
Hemoglobin: 16.7
MCHC: 33.2
MCHC: 33.8
MCV: 91.5
MCV: 91.6
Platelets: 180
Platelets: 199
RBC: 4.95
RBC: 5.4
RDW: 13.1
RDW: 13.3
WBC: 7.5
WBC: 6

## 2012-03-14 ENCOUNTER — Other Ambulatory Visit: Payer: Self-pay | Admitting: Family Medicine

## 2012-03-14 DIAGNOSIS — M5416 Radiculopathy, lumbar region: Secondary | ICD-10-CM

## 2012-03-18 ENCOUNTER — Ambulatory Visit
Admission: RE | Admit: 2012-03-18 | Discharge: 2012-03-18 | Disposition: A | Discharge status: Home / Self Care | Payer: Medicare Other | Source: Ambulatory Visit | Attending: Family Medicine | Admitting: Family Medicine

## 2012-03-18 DIAGNOSIS — M5416 Radiculopathy, lumbar region: Secondary | ICD-10-CM

## 2012-03-18 MED ORDER — GADOBENATE DIMEGLUMINE 529 MG/ML IV SOLN
18.0000 mL | Freq: Once | INTRAVENOUS | Status: AC | PRN
  Administered 2012-03-18: 18 mL via INTRAVENOUS

## 2012-05-16 ENCOUNTER — Other Ambulatory Visit: Payer: Self-pay | Admitting: Neurological Surgery

## 2012-06-04 ENCOUNTER — Encounter (HOSPITAL_COMMUNITY): Payer: Self-pay | Admitting: Respiratory Therapy

## 2012-06-11 NOTE — Pre-Procedure Instructions (Signed)
20 Seth Schwartz  06/11/2012   Your procedure is scheduled on:  Tues, Sept 10 @ 10:25 AM  Report to [facility] at 7:15  AM.  Call this number if you have problems the morning of surgery: [phone]   Remember:   Do not eat food:After Midnight.  Take these medicines the morning of surgery with A SIP OF WATER: Diazepam(Valium),Pain Pill(if needed),and Tramadol(Ultram)   Do not wear jewelry  Do not wear lotions, powders, or colognes  Men may shave face and neck.  Do not bring valuables to the hospital.  Contacts, dentures or bridgework may not be worn into surgery.  Leave suitcase in the car. After surgery it may be brought to your room.  For patients admitted to the hospital, checkout time is 11:00 AM the day of discharge.   Patients discharged the day of surgery will not be allowed to drive home.    Special Instructions: CHG Shower Use Special Wash: 1/2 bottle night before surgery and 1/2 bottle morning of surgery.   Please read over the following fact sheets that you were given: Pain Booklet, Coughing and Deep Breathing, Blood Transfusion Information, MRSA Information and Surgical Site Infection Prevention

## 2012-06-12 ENCOUNTER — Encounter (HOSPITAL_COMMUNITY)
Admission: RE | Admit: 2012-06-12 | Discharge: 2012-06-12 | Disposition: A | Discharge status: Home / Self Care | Payer: Medicare Other | Source: Ambulatory Visit | Attending: Neurological Surgery | Admitting: Neurological Surgery

## 2012-06-12 ENCOUNTER — Ambulatory Visit (HOSPITAL_COMMUNITY)
Admission: RE | Admit: 2012-06-12 | Discharge: 2012-06-12 | Disposition: A | Discharge status: Home / Self Care | Payer: Medicare Other | Source: Ambulatory Visit | Attending: Neurological Surgery | Admitting: Neurological Surgery

## 2012-06-12 ENCOUNTER — Encounter (HOSPITAL_COMMUNITY): Payer: Self-pay

## 2012-06-12 DIAGNOSIS — Z01812 Encounter for preprocedural laboratory examination: Secondary | ICD-10-CM | POA: Insufficient documentation

## 2012-06-12 DIAGNOSIS — Z01810 Encounter for preprocedural cardiovascular examination: Secondary | ICD-10-CM | POA: Insufficient documentation

## 2012-06-12 DIAGNOSIS — Z01818 Encounter for other preprocedural examination: Secondary | ICD-10-CM | POA: Insufficient documentation

## 2012-06-12 HISTORY — DX: Depression, unspecified: F32.A

## 2012-06-12 HISTORY — DX: Constipation, unspecified: K59.00

## 2012-06-12 HISTORY — DX: Essential (primary) hypertension: I10

## 2012-06-12 HISTORY — DX: Major depressive disorder, single episode, unspecified: F32.9

## 2012-06-12 HISTORY — DX: Gastro-esophageal reflux disease without esophagitis: K21.9

## 2012-06-12 HISTORY — DX: Anxiety disorder, unspecified: F41.9

## 2012-06-12 LAB — BASIC METABOLIC PANEL
BUN: 16 mg/dL (ref 6–23)
CO2: 28 mEq/L (ref 19–32)
Calcium: 10.5 mg/dL (ref 8.4–10.5)
Chloride: 104 mEq/L (ref 96–112)
Creatinine, Ser: 1.24 mg/dL (ref 0.50–1.35)
GFR calc Af Amer: 64 mL/min — ABNORMAL LOW (ref >90)
GFR calc non Af Amer: 56 mL/min — ABNORMAL LOW (ref >90)
Glucose, Bld: 124 mg/dL — ABNORMAL HIGH (ref 70–99)
Potassium: 4.2 mEq/L (ref 3.5–5.1)
Sodium: 140 mEq/L (ref 135–145)

## 2012-06-12 LAB — TYPE AND SCREEN
ABO/RH(D): O POS
Antibody Screen: NEGATIVE

## 2012-06-12 LAB — CBC
HCT: 52.8 % — ABNORMAL HIGH (ref 39.0–52.0)
Hemoglobin: 17.7 g/dL — ABNORMAL HIGH (ref 13.0–17.0)
MCH: 29.9 pg (ref 26.0–34.0)
MCHC: 33.5 g/dL (ref 30.0–36.0)
MCV: 89.2 fL (ref 78.0–100.0)
Platelets: 176 10*3/uL (ref 150–400)
RBC: 5.92 MIL/uL — ABNORMAL HIGH (ref 4.22–5.81)
RDW: 14.7 % (ref 11.5–15.5)
WBC: 6.4 10*3/uL (ref 4.0–10.5)

## 2012-06-12 LAB — SURGICAL PCR SCREEN
MRSA, PCR: NEGATIVE
Staphylococcus aureus: NEGATIVE

## 2012-06-17 MED ORDER — VANCOMYCIN HCL IN DEXTROSE 1-5 GM/200ML-% IV SOLN
1000.0000 mg | INTRAVENOUS | Status: DC
  Filled 2012-06-17: qty 200

## 2012-06-18 ENCOUNTER — Ambulatory Visit (HOSPITAL_COMMUNITY): Payer: Medicare Other

## 2012-06-18 ENCOUNTER — Inpatient Hospital Stay (HOSPITAL_COMMUNITY)
Admission: RE | Admit: 2012-06-18 | Discharge: 2012-06-19 | DRG: 460 | Disposition: A | Discharge status: Home / Self Care | Payer: Medicare Other | Source: Ambulatory Visit | Attending: Neurological Surgery | Admitting: Neurological Surgery

## 2012-06-18 ENCOUNTER — Encounter (HOSPITAL_COMMUNITY)
Admission: RE | Disposition: A | Discharge status: Home / Self Care | Payer: Self-pay | Source: Ambulatory Visit | Attending: Neurological Surgery

## 2012-06-18 ENCOUNTER — Encounter (HOSPITAL_COMMUNITY): Payer: Self-pay | Admitting: Certified Registered"

## 2012-06-18 ENCOUNTER — Ambulatory Visit (HOSPITAL_COMMUNITY): Payer: Medicare Other | Admitting: Certified Registered"

## 2012-06-18 ENCOUNTER — Encounter (HOSPITAL_COMMUNITY): Payer: Self-pay

## 2012-06-18 DIAGNOSIS — M47817 Spondylosis without myelopathy or radiculopathy, lumbosacral region: Principal | ICD-10-CM | POA: Diagnosis present

## 2012-06-18 DIAGNOSIS — IMO0001 Reserved for inherently not codable concepts without codable children: Secondary | ICD-10-CM | POA: Diagnosis present

## 2012-06-18 DIAGNOSIS — K219 Gastro-esophageal reflux disease without esophagitis: Secondary | ICD-10-CM | POA: Diagnosis present

## 2012-06-18 DIAGNOSIS — F3289 Other specified depressive episodes: Secondary | ICD-10-CM | POA: Diagnosis present

## 2012-06-18 DIAGNOSIS — F411 Generalized anxiety disorder: Secondary | ICD-10-CM | POA: Diagnosis present

## 2012-06-18 DIAGNOSIS — I10 Essential (primary) hypertension: Secondary | ICD-10-CM | POA: Diagnosis present

## 2012-06-18 DIAGNOSIS — F329 Major depressive disorder, single episode, unspecified: Secondary | ICD-10-CM | POA: Diagnosis present

## 2012-06-18 DIAGNOSIS — M47816 Spondylosis without myelopathy or radiculopathy, lumbar region: Secondary | ICD-10-CM

## 2012-06-18 SURGERY — POSTERIOR LUMBAR FUSION 1 LEVEL
Anesthesia: General | Site: Back | Wound class: Clean

## 2012-06-18 MED ORDER — LACTATED RINGERS IV SOLN
INTRAVENOUS | Status: DC | PRN
  Administered 2012-06-18 (×3): via INTRAVENOUS

## 2012-06-18 MED ORDER — MORPHINE SULFATE 2 MG/ML IJ SOLN: 1.0000 mg | INTRAMUSCULAR | Status: DC | PRN

## 2012-06-18 MED ORDER — PANTOPRAZOLE SODIUM 40 MG PO TBEC
40.0000 mg | DELAYED_RELEASE_TABLET | Freq: Every day | ORAL | Status: DC
  Administered 2012-06-19: 40 mg via ORAL
  Filled 2012-06-18: qty 1

## 2012-06-18 MED ORDER — SODIUM CHLORIDE 0.9 % IV SOLN
INTRAVENOUS | Status: AC
  Filled 2012-06-18: qty 500

## 2012-06-18 MED ORDER — ACETAMINOPHEN 650 MG RE SUPP: 650.0000 mg | RECTAL | Status: DC | PRN

## 2012-06-18 MED ORDER — TAMSULOSIN HCL 0.4 MG PO CAPS
0.4000 mg | ORAL_CAPSULE | Freq: Every day | ORAL | Status: DC
  Administered 2012-06-18 – 2012-06-19 (×2): 0.4 mg via ORAL
  Filled 2012-06-18 (×2): qty 1

## 2012-06-18 MED ORDER — PHENYLEPHRINE HCL 10 MG/ML IJ SOLN
INTRAMUSCULAR | Status: DC | PRN
  Administered 2012-06-18: 80 ug via INTRAVENOUS

## 2012-06-18 MED ORDER — LOSARTAN POTASSIUM 50 MG PO TABS
50.0000 mg | ORAL_TABLET | Freq: Every day | ORAL | Status: DC
  Administered 2012-06-18 – 2012-06-19 (×2): 50 mg via ORAL
  Filled 2012-06-18 (×2): qty 1

## 2012-06-18 MED ORDER — OXYCODONE HCL 5 MG PO TABS
5.0000 mg | ORAL_TABLET | ORAL | Status: DC | PRN
  Administered 2012-06-18 – 2012-06-19 (×3): 5 mg via ORAL
  Filled 2012-06-18 (×3): qty 1

## 2012-06-18 MED ORDER — DIAZEPAM 5 MG PO TABS: 5.0000 mg | ORAL_TABLET | Freq: Four times a day (QID) | ORAL | Status: DC | PRN

## 2012-06-18 MED ORDER — 0.9 % SODIUM CHLORIDE (POUR BTL) OPTIME
TOPICAL | Status: DC | PRN
  Administered 2012-06-18: 1000 mL

## 2012-06-18 MED ORDER — SODIUM CHLORIDE 0.9 % IR SOLN
Status: DC | PRN
  Administered 2012-06-18: 11:00:00

## 2012-06-18 MED ORDER — SODIUM CHLORIDE 0.9 % IV SOLN: 250.0000 mL | INTRAVENOUS | Status: DC

## 2012-06-18 MED ORDER — NEOSTIGMINE METHYLSULFATE 1 MG/ML IJ SOLN
INTRAMUSCULAR | Status: DC | PRN
  Administered 2012-06-18: 4 mg via INTRAVENOUS

## 2012-06-18 MED ORDER — PROPOFOL 10 MG/ML IV BOLUS
INTRAVENOUS | Status: DC | PRN
  Administered 2012-06-18: 170 mg via INTRAVENOUS

## 2012-06-18 MED ORDER — ACETAMINOPHEN 10 MG/ML IV SOLN
INTRAVENOUS | Status: DC | PRN
  Administered 2012-06-18: 1000 mg via INTRAVENOUS

## 2012-06-18 MED ORDER — SODIUM CHLORIDE 0.9 % IJ SOLN
3.0000 mL | Freq: Two times a day (BID) | INTRAMUSCULAR | Status: DC
  Administered 2012-06-19: 3 mL via INTRAVENOUS

## 2012-06-18 MED ORDER — SODIUM CHLORIDE 0.9 % IJ SOLN: 3.0000 mL | INTRAMUSCULAR | Status: DC | PRN

## 2012-06-18 MED ORDER — HEPARIN SODIUM (PORCINE) 1000 UNIT/ML IJ SOLN
INTRAMUSCULAR | Status: AC
  Filled 2012-06-18: qty 1

## 2012-06-18 MED ORDER — CEFAZOLIN SODIUM 1-5 GM-% IV SOLN: 1.0000 g | Freq: Three times a day (TID) | INTRAVENOUS | Status: DC

## 2012-06-18 MED ORDER — HYDROMORPHONE HCL PF 1 MG/ML IJ SOLN
0.2500 mg | INTRAMUSCULAR | Status: DC | PRN
  Administered 2012-06-18 (×2): 0.5 mg via INTRAVENOUS

## 2012-06-18 MED ORDER — SODIUM CHLORIDE 0.9 % IJ SOLN: 3.0000 mL | Freq: Two times a day (BID) | INTRAMUSCULAR | Status: DC

## 2012-06-18 MED ORDER — ALUM & MAG HYDROXIDE-SIMETH 200-200-20 MG/5ML PO SUSP: 30.0000 mL | Freq: Four times a day (QID) | ORAL | Status: DC | PRN

## 2012-06-18 MED ORDER — DOCUSATE SODIUM 100 MG PO CAPS
100.0000 mg | ORAL_CAPSULE | Freq: Two times a day (BID) | ORAL | Status: DC
  Administered 2012-06-18 – 2012-06-19 (×2): 100 mg via ORAL
  Filled 2012-06-18 (×2): qty 1

## 2012-06-18 MED ORDER — CEFAZOLIN SODIUM 1-5 GM-% IV SOLN
1.0000 g | Freq: Three times a day (TID) | INTRAVENOUS | Status: AC
  Administered 2012-06-18 – 2012-06-19 (×2): 1 g via INTRAVENOUS
  Filled 2012-06-18 (×2): qty 50

## 2012-06-18 MED ORDER — DIAZEPAM 5 MG PO TABS
5.0000 mg | ORAL_TABLET | Freq: Four times a day (QID) | ORAL | Status: DC | PRN
  Administered 2012-06-18 – 2012-06-19 (×4): 5 mg via ORAL
  Filled 2012-06-18 (×3): qty 1

## 2012-06-18 MED ORDER — BIMATOPROST 0.03 % OP SOLN
1.0000 [drp] | Freq: Every day | OPHTHALMIC | Status: DC
  Filled 2012-06-18: qty 2.5

## 2012-06-18 MED ORDER — BIMATOPROST 0.01 % OP SOLN
1.0000 [drp] | Freq: Every day | OPHTHALMIC | Status: DC
  Administered 2012-06-18: 1 [drp] via OPHTHALMIC
  Filled 2012-06-18: qty 2.5

## 2012-06-18 MED ORDER — OXYCODONE-ACETAMINOPHEN 5-325 MG PO TABS
ORAL_TABLET | ORAL | Status: AC
  Administered 2012-06-18: 2
  Filled 2012-06-18: qty 2

## 2012-06-18 MED ORDER — HYDROMORPHONE HCL PF 1 MG/ML IJ SOLN
INTRAMUSCULAR | Status: AC
  Filled 2012-06-18: qty 1

## 2012-06-18 MED ORDER — ONDANSETRON HCL 4 MG/2ML IJ SOLN: 4.0000 mg | INTRAMUSCULAR | Status: DC | PRN

## 2012-06-18 MED ORDER — FLUVOXAMINE MALEATE 50 MG PO TABS
50.0000 mg | ORAL_TABLET | Freq: Every morning | ORAL | Status: DC
  Administered 2012-06-19: 50 mg via ORAL
  Filled 2012-06-18: qty 1

## 2012-06-18 MED ORDER — FENTANYL CITRATE 0.05 MG/ML IJ SOLN
INTRAMUSCULAR | Status: DC | PRN
  Administered 2012-06-18 (×6): 50 ug via INTRAVENOUS

## 2012-06-18 MED ORDER — MIDAZOLAM HCL 5 MG/5ML IJ SOLN
INTRAMUSCULAR | Status: DC | PRN
  Administered 2012-06-18: 1 mg via INTRAVENOUS

## 2012-06-18 MED ORDER — MENTHOL 3 MG MT LOZG: 1.0000 | LOZENGE | OROMUCOSAL | Status: DC | PRN

## 2012-06-18 MED ORDER — PHENOL 1.4 % MT LIQD: 1.0000 | OROMUCOSAL | Status: DC | PRN

## 2012-06-18 MED ORDER — ACETAMINOPHEN 325 MG PO TABS: 650.0000 mg | ORAL_TABLET | ORAL | Status: DC | PRN

## 2012-06-18 MED ORDER — BUPIVACAINE HCL (PF) 0.5 % IJ SOLN
INTRAMUSCULAR | Status: DC | PRN
  Administered 2012-06-18: 30 mL

## 2012-06-18 MED ORDER — ONDANSETRON HCL 4 MG/2ML IJ SOLN: 4.0000 mg | Freq: Four times a day (QID) | INTRAMUSCULAR | Status: DC | PRN

## 2012-06-18 MED ORDER — CYCLOBENZAPRINE HCL 10 MG PO TABS
10.0000 mg | ORAL_TABLET | Freq: Every day | ORAL | Status: DC | PRN
  Administered 2012-06-19: 10 mg via ORAL
  Filled 2012-06-18: qty 1

## 2012-06-18 MED ORDER — THROMBIN 20000 UNITS EX KIT
PACK | CUTANEOUS | Status: DC | PRN
  Administered 2012-06-18: 20000 [IU] via TOPICAL

## 2012-06-18 MED ORDER — ONDANSETRON HCL 4 MG/2ML IJ SOLN
INTRAMUSCULAR | Status: DC | PRN
  Administered 2012-06-18: 4 mg via INTRAVENOUS

## 2012-06-18 MED ORDER — BISACODYL 10 MG RE SUPP: 10.0000 mg | Freq: Every day | RECTAL | Status: DC | PRN

## 2012-06-18 MED ORDER — TRAZODONE HCL 100 MG PO TABS
100.0000 mg | ORAL_TABLET | Freq: Every day | ORAL | Status: DC
  Administered 2012-06-18: 100 mg via ORAL
  Filled 2012-06-18 (×2): qty 1

## 2012-06-18 MED ORDER — HEMOSTATIC AGENTS (NO CHARGE) OPTIME
TOPICAL | Status: DC | PRN
  Administered 2012-06-18: 1 via TOPICAL

## 2012-06-18 MED ORDER — GLYCOPYRROLATE 0.2 MG/ML IJ SOLN
INTRAMUSCULAR | Status: DC | PRN
  Administered 2012-06-18: 0.6 mg via INTRAVENOUS

## 2012-06-18 MED ORDER — LIDOCAINE HCL (CARDIAC) 20 MG/ML IV SOLN
INTRAVENOUS | Status: DC | PRN
  Administered 2012-06-18: 70 mg via INTRAVENOUS

## 2012-06-18 MED ORDER — VANCOMYCIN HCL IN DEXTROSE 1-5 GM/200ML-% IV SOLN
INTRAVENOUS | Status: AC
  Administered 2012-06-18: 1000 mg via INTRAVENOUS
  Filled 2012-06-18: qty 200

## 2012-06-18 MED ORDER — BACITRACIN 50000 UNITS IM SOLR
INTRAMUSCULAR | Status: AC
  Filled 2012-06-18: qty 1

## 2012-06-18 MED ORDER — ACETAMINOPHEN 10 MG/ML IV SOLN
INTRAVENOUS | Status: AC
  Filled 2012-06-18: qty 100

## 2012-06-18 MED ORDER — DIAZEPAM 5 MG PO TABS
ORAL_TABLET | ORAL | Status: AC
  Filled 2012-06-18: qty 1

## 2012-06-18 MED ORDER — DIPHENHYDRAMINE HCL 25 MG PO CAPS: 25.0000 mg | ORAL_CAPSULE | ORAL | Status: DC | PRN

## 2012-06-18 MED ORDER — OXYCODONE-ACETAMINOPHEN 5-325 MG PO TABS
1.0000 | ORAL_TABLET | ORAL | Status: DC | PRN
  Administered 2012-06-18 – 2012-06-19 (×3): 1 via ORAL
  Filled 2012-06-18 (×3): qty 1

## 2012-06-18 MED ORDER — OXYCODONE-ACETAMINOPHEN 10-325 MG PO TABS: 1.0000 | ORAL_TABLET | ORAL | Status: DC | PRN

## 2012-06-18 MED ORDER — ALBUMIN HUMAN 5 % IV SOLN
INTRAVENOUS | Status: DC | PRN
  Administered 2012-06-18: 12:00:00 via INTRAVENOUS

## 2012-06-18 MED ORDER — SODIUM CHLORIDE 0.9 % IV SOLN: INTRAVENOUS | Status: DC

## 2012-06-18 MED ORDER — ROCURONIUM BROMIDE 100 MG/10ML IV SOLN
INTRAVENOUS | Status: DC | PRN
  Administered 2012-06-18 (×2): 10 mg via INTRAVENOUS
  Administered 2012-06-18: 50 mg via INTRAVENOUS
  Administered 2012-06-18: 10 mg via INTRAVENOUS

## 2012-06-18 MED ORDER — LIDOCAINE-EPINEPHRINE 1 %-1:100000 IJ SOLN
INTRAMUSCULAR | Status: DC | PRN
  Administered 2012-06-18: 20 mL

## 2012-06-18 SURGICAL SUPPLY — 61 items
ADH SKN CLS APL DERMABOND .7 (GAUZE/BANDAGES/DRESSINGS) ×1
BAG DECANTER FOR FLEXI CONT (MISCELLANEOUS) ×2 IMPLANT
BLADE SURG ROTATE 9660 (MISCELLANEOUS) IMPLANT
BUR MATCHSTICK NEURO 3.0 LAGG (BURR) ×2 IMPLANT
CANISTER SUCTION 2500CC (MISCELLANEOUS) ×2 IMPLANT
CLOTH BEACON ORANGE TIMEOUT ST (SAFETY) ×2 IMPLANT
CONT SPEC 4OZ CLIKSEAL STRL BL (MISCELLANEOUS) ×4 IMPLANT
COVER BACK TABLE 24X17X13 BIG (DRAPES) IMPLANT
COVER TABLE BACK 60X90 (DRAPES) ×2 IMPLANT
DECANTER SPIKE VIAL GLASS SM (MISCELLANEOUS) ×2 IMPLANT
DERMABOND ADVANCED (GAUZE/BANDAGES/DRESSINGS) ×1
DERMABOND ADVANCED .7 DNX12 (GAUZE/BANDAGES/DRESSINGS) ×1 IMPLANT
DRAPE C-ARM 42X72 X-RAY (DRAPES) ×4 IMPLANT
DRAPE LAPAROTOMY 100X72X124 (DRAPES) ×2 IMPLANT
DRAPE POUCH INSTRU U-SHP 10X18 (DRAPES) ×2 IMPLANT
DRAPE PROXIMA HALF (DRAPES) IMPLANT
DURAPREP 26ML APPLICATOR (WOUND CARE) ×2 IMPLANT
ELECT REM PT RETURN 9FT ADLT (ELECTROSURGICAL) ×2
ELECTRODE REM PT RTRN 9FT ADLT (ELECTROSURGICAL) ×1 IMPLANT
GAUZE SPONGE 4X4 16PLY XRAY LF (GAUZE/BANDAGES/DRESSINGS) IMPLANT
GLOVE BIOGEL PI IND STRL 8 (GLOVE) IMPLANT
GLOVE BIOGEL PI IND STRL 8.5 (GLOVE) ×2 IMPLANT
GLOVE BIOGEL PI INDICATOR 8 (GLOVE) ×3
GLOVE BIOGEL PI INDICATOR 8.5 (GLOVE) ×2
GLOVE ECLIPSE 7.5 STRL STRAW (GLOVE) ×4 IMPLANT
GLOVE ECLIPSE 8.5 STRL (GLOVE) ×4 IMPLANT
GLOVE EXAM NITRILE LRG STRL (GLOVE) IMPLANT
GLOVE EXAM NITRILE MD LF STRL (GLOVE) IMPLANT
GLOVE EXAM NITRILE XL STR (GLOVE) IMPLANT
GLOVE EXAM NITRILE XS STR PU (GLOVE) IMPLANT
GLOVE INDICATOR 7.5 STRL GRN (GLOVE) ×1 IMPLANT
GLOVE INDICATOR 8.0 STRL GRN (GLOVE) ×2 IMPLANT
GLOVE INDICATOR 8.5 STRL (GLOVE) ×3 IMPLANT
GOWN BRE IMP SLV AUR LG STRL (GOWN DISPOSABLE) ×1 IMPLANT
GOWN BRE IMP SLV AUR XL STRL (GOWN DISPOSABLE) ×1 IMPLANT
GOWN STRL REIN 2XL LVL4 (GOWN DISPOSABLE) ×6 IMPLANT
KIT BASIN OR (CUSTOM PROCEDURE TRAY) ×2 IMPLANT
KIT ROOM TURNOVER OR (KITS) ×2 IMPLANT
NEEDLE HYPO 22GX1.5 SAFETY (NEEDLE) ×2 IMPLANT
NS IRRIG 1000ML POUR BTL (IV SOLUTION) ×2 IMPLANT
PACK FOAM VITOSS 10CC (Orthopedic Implant) ×1 IMPLANT
PACK LAMINECTOMY NEURO (CUSTOM PROCEDURE TRAY) ×2 IMPLANT
PAD ARMBOARD 7.5X6 YLW CONV (MISCELLANEOUS) ×6 IMPLANT
PATTIES SURGICAL .5 X1 (DISPOSABLE) ×2 IMPLANT
PEEK PLIF NOVEL 9X25X12 (Peek) ×2 IMPLANT
ROD 35MM (Rod) ×2 IMPLANT
SCREW 40MM (Screw) ×4 IMPLANT
SCREW SET SPINAL STD HEXALOBE (Screw) ×4 IMPLANT
SPONGE GAUZE 4X4 12PLY (GAUZE/BANDAGES/DRESSINGS) ×2 IMPLANT
SPONGE LAP 4X18 X RAY DECT (DISPOSABLE) IMPLANT
SPONGE SURGIFOAM ABS GEL 100 (HEMOSTASIS) ×2 IMPLANT
SUT VIC AB 1 CT1 18XBRD ANBCTR (SUTURE) ×1 IMPLANT
SUT VIC AB 1 CT1 8-18 (SUTURE) ×2
SUT VIC AB 2-0 CP2 18 (SUTURE) ×2 IMPLANT
SUT VIC AB 3-0 SH 8-18 (SUTURE) ×2 IMPLANT
SYR 20ML ECCENTRIC (SYRINGE) ×2 IMPLANT
TOWEL OR 17X24 6PK STRL BLUE (TOWEL DISPOSABLE) ×2 IMPLANT
TOWEL OR 17X26 10 PK STRL BLUE (TOWEL DISPOSABLE) ×2 IMPLANT
TRAP SPECIMEN MUCOUS 40CC (MISCELLANEOUS) ×2 IMPLANT
TRAY FOLEY CATH 14FRSI W/METER (CATHETERS) ×2 IMPLANT
WATER STERILE IRR 1000ML POUR (IV SOLUTION) ×2 IMPLANT

## 2012-06-18 NOTE — Anesthesia Postprocedure Evaluation (Signed)
Anesthesia Post Note  Patient: Seth Schwartz  Procedure(s) Performed: Procedure(s) (LRB): POSTERIOR LUMBAR FUSION 1 LEVEL (N/A)  Anesthesia type: General  Patient location: PACU  Post pain: Pain level controlled and Adequate analgesia  Post assessment: Post-op Vital signs reviewed, Patient's Cardiovascular Status Stable, Respiratory Function Stable, Patent Airway and Pain level controlled  Last Vitals:  Filed Vitals:   06/18/12 1515  BP:   Pulse: 70  Temp:   Resp: 11    Post vital signs: Reviewed and stable  Level of consciousness: awake, alert  and oriented  Complications: No apparent anesthesia complications

## 2012-06-18 NOTE — Op Note (Signed)
Procedure: L4-L5 decompressive laminectomy decompression of S1 and L5 nerve roots, posterior lumbar interbody arthrodesis with peek spacers local autograft and allograft, pedicle screw fixation L5-S1, posterior lateral arthrodesis L5-S1  Surgeon: Barnett Abu M.D.  Asst.: Tressie Stalker M.D.  Indications: Patient is Seth Schwartz is a 76 y.o. male who who's had significant back pain and lumbar radiculopathy for over a years period time. An MRI was performed and demonstrates the presence of advanced degenerative changes at L5-S1 with lateral recess stenosis for both L5 nerve roots worse on the right than on the left. Patient was advised regarding surgical intervention decompress and stabilize the L5-S1 segment as he already has an arthrodesis from L2-L5   Procedure: The patient was brought to the operating room supine on a stretcher. After the smooth induction of general endotracheal anesthesia she was turned prone and the back was prepped with alcohol and DuraPrep. The back was then draped sterilely. A midline incision was created and carried down to the lumbar dorsal fascia. A localizing radiograph identified theL5 spinous processes. A subligamentous dissection was created at  L5 to expose the interlaminar space at  L5 S1 and the facet joints over the L5-S1 interspace. Laminotomies were were then created removing the entire inferior margin of the lamina of L5 including the inferior facet at the L5-S1 joint. The yellow ligament was taken up and the common dural tube was exposed along with the L5 nerve root superiorly, and the S1 nerve root inferiorly, the disc space was exposed and epidural veins in this region were cauterized and divided. The S1 nerve roots and the L5 nerve root were dissected with care taken to protect them. The disc space was opened and a combination of curettes and rongeurs was used to evacuate the disc space fully. The endplates were removed using sharp curettes. An interbody spacer  was placed to distract the disc space while the contralateral discectomy was performed. When the entirety of the disc was removed and the endplates were prepared final sizing of the disc space was obtained 12 mm peek spacers were chosen and packed with autograft and allograft and placed into the interspace. The remainder of the interspace was packed with autograft and allograft. Pedicle entry sites were then chosen using fluoroscopic guidance and 6.5 x 40 mm screws were placed in L5 and 6.5 x 40 mm screws were placed in S1. The lateral gutters were decorticated and graft was packed in the posterolateral gutters between L5 and S1. Final radiographs were obtained after placing appropriately sized rods between the pedicle screws at L5 and torquing these to the appropriate tension. The surgical site was inspected carefully to assure the S1 and L5 nerve roots were well decompressed, hemostasis was obtained, and the graft was well packed. Then the retractors were removed and the wound was closed with #1 Vicryl in the lumbar dorsal fascia 2-0 Vicryl in the subcutaneous tissue and 3-0 Vicryl subcuticularly. . Blood loss was estimated at 250 cc. The patient tolerated procedure well and was returned to the recovery room in stable condition.

## 2012-06-18 NOTE — Progress Notes (Signed)
Patient ID: Seth Schwartz, male   DOB: 1936-01-24, 76 y.o.   MRN: [id number] Vital signs are stable. Patient appears awake alert and comfortable with minimal complaints of back discomfort no radicular pain.  Patient notes he has difficulty with urination and also severe constipation.  Patient also notes he has headache.  Motor function appears good dressing is clean and dry.  Plan we'll give Benadryl for headache, Flomax for difficulty with urination. Plan mobilize in a.m.

## 2012-06-18 NOTE — Anesthesia Preprocedure Evaluation (Addendum)
Anesthesia Evaluation  Patient identified by MRN, date of birth, ID band Patient awake    Reviewed: Allergy & Precautions, H&P , NPO status , Patient's Chart, lab work & pertinent test results  Airway Mallampati: II TM Distance: >3 FB Neck ROM: full    Dental  (+) Edentulous Upper and Dental Advisory Given   Pulmonary          Cardiovascular hypertension,     Neuro/Psych  Headaches, Anxiety Depression  Neuromuscular disease    GI/Hepatic GERD-  ,  Endo/Other    Renal/GU      Musculoskeletal  (+) Arthritis -, Fibromyalgia -  Abdominal   Peds  Hematology   Anesthesia Other Findings   Reproductive/Obstetrics                          Anesthesia Physical Anesthesia Plan  ASA: III  Anesthesia Plan: General   Post-op Pain Management:    Induction: Intravenous  Airway Management Planned: Oral ETT  Additional Equipment:   Intra-op Plan:   Post-operative Plan: Extubation in OR  Informed Consent: I have reviewed the patients History and Physical, chart, labs and discussed the procedure including the risks, benefits and alternatives for the proposed anesthesia with the patient or authorized representative who has indicated his/her understanding and acceptance.     Plan Discussed with: CRNA and Surgeon  Anesthesia Plan Comments:         Anesthesia Quick Evaluation

## 2012-06-18 NOTE — Transfer of Care (Signed)
Immediate Anesthesia Transfer of Care Note  Patient: Seth Schwartz  Procedure(s) Performed: Procedure(s) (LRB) with comments: POSTERIOR LUMBAR FUSION 1 LEVEL (N/A) - Lumbar five - sacral one Decompression/fusion/posterior lumbar interbody fusion/Pedicle screws/Peek spacers  Patient Location: PACU  Anesthesia Type: General  Level of Consciousness: awake, alert  and oriented  Airway & Oxygen Therapy: Patient Spontanous Breathing and Patient connected to nasal cannula oxygen  Post-op Assessment: Report given to PACU RN, Post -op Vital signs reviewed and stable and Patient moving all extremities X 4  Post vital signs: Reviewed and stable  Complications: No apparent anesthesia complications

## 2012-06-18 NOTE — H&P (Signed)
06/18/2012:  Mr. Frisbee presents for surgery  today.  Since his last visit we did a transforaminal epidural steroid injection at the level of L5-S1 bilaterally.  After the injection Mr. Kunz notes that he had very little to no relief.  He did encounter some weakness in his left leg which he feels has persisted to this time.  He is still having substantial pain on a daily basis and he notes that if he does any increase in activity he has significant pain in an L5 distribution in both lower extremities, worse on the left than on the right.    Reviewing the MRI that we did in June, I note that he has marked facet hypertrophy at the L5-S1 space and he has marked overgrowth of the superior articular process which impinges onto the L5 nerve root in the exit foramen.  This is problematic for Mr. Turski in that any surgical decompression of this will result in the need to undercut the facet joint to such a degree that it will render him further unstable.  I noted that I do not believe that a simple decompression is a good operation for his situation.  Ultimately if this is to be addressed surgically, Mr. Brackins will need to have decompression of the L5 nerve roots with wide facetectomies and posterior interbody arthrodesis using PEEK spacers and pedicle screw fixation from L5 to S1.  This is a much more substantial operation, but it is not unlike the operation that he had at L2-3. He recovered well from that procedure and I am hopeful that Mr. Foisy should recover well from this procedure.  He notes that the symptoms are such that he cannot tolerate them and if anything can be done to help this condition, he would like to consider it and proceed.  I believe that this is a very reasonable operation given his current predicament. He notes that he has had to limit significant activities of daily living and this has been rather difficult for him. He is admitted for surgery today.         Stefani Dama,  M.D./[facility] NEUROSURGICAL Evaluation  Maren Reamer. Helmers  #[id number] DOB:  1936/01/03  March 25, 2012  HISTORY OF PRESENT ILLNESS:  Mr. Gearin returns to the office today to discuss a new problem.  I had last visited with him back in 2007 while he was recovering from lumbar surgery at the L2-3 level.  Alanson has a longstanding history of lumbar spine problems.  Initially I had decompressed L4-5 with a Ray cage technique.  He subsequently had adjacent level disease at L3-4 and did well for a few years after that and then had adjacent level disease at L2-3.  In reviewing the chart, his last surgery was in 2007.  He has also had advanced cervical spondylosis and has a 3-level anterior cervical decompression and arthrodesis.  Until about the past year or so, Mr. Walker has been doing reasonably well with his back.  He does keep some chronic back pain but he has been able to mitigate this with some medication.  He does use Hydrocodone on occasion when he does some strenuous activity but he enjoys playing golf on a regular basis.  He tells me that this activity has been seriously impeded in the last number of months.  He tells me that after playing a half a round or 9 holes he is terribly uncomfortable with pain.  The only way he can get good relief is if  he flexes forward.  This he notes will give him relief.  Most of the time if he stands for more than just a few minutes he gets pain into the buttocks and posterior thighs on both sides.  He will note that the pain will tend to radiate down to the back of the leg and foot.    He is seen today with a new MRI of his lumbar spine that was ordered by his primary care specialist, Dr. Catha Gosselin.  Dr. Clarene Duke had referred him here for further evaluation.  MRI performed 03/18/2012 demonstrates that there is a solid arthrodesis L2 to L5.  The alignment of the spine is good in the coronal and sagittal planes.  The most notable finding occurs at L5-S1 where there is marked facet  hypertrophy causing lateral recess stenosis for the L5 nerve root particularly.  I demonstrated that he has tight foramen for the L5 nerve root on both sides.  He notes that the bulk of his pain occurs just below the belt line in the lower back and into both buttocks and posterior thighs.  This would correspond well with the generation of this pain.    PAST MEDICAL HISTORY:  His Past Medical History has been quite stable.  CURRENT MEDICATIONS:  Losartan 50 mg. q.d., Fluvoxamine 50 mg. q.d., Trazodone 100 mg. q.d., Omeprazole 20 mg. b.i.d., Cyclobenzaprine 10 mg. p.r.n., Tramadol 50 mg. p.r.n., Hydrocodone 5/325 p.r.n., Diazepam 5 mg. p.r.n., Timolol 1 drop b.i.d., Testosterone 100 mg. every 10 days, Vitamin B12 1000 mg. q.d., Vitamin D 1800 mg. q.d., Fish oil 1000 mg. b.i.d., senior multivitamins q.d., and Cholest-Off 900 mg. q.d.   ALLERGIES:    HE NOTES ALLERGIES TO PENICILLIN AND SULFA.  PHYSICAL EXAMINATION:  On physical exam, I note that he stands straight and erect without difficulty.  He walks with a slight limp on the left side.  His motor strength appears good in the major groups including the iliopsoas, quadriceps, tibialis anterior and gastrocs.  His reflexes are 2+ and symmetric in the patella and Achilles both.    IMAGING STUDIES:   Today in the office to finish his workup, we obtained flexion and extension lateral lumbar spine in addition to a singular AP view.  Radiographs demonstrate and recapitulate the arthrodesis at L2-3, 3-4 and 4-5. At L3-4 and 4-5, he has Ray cages as the structural component.  He has pedicle screws at L2-3.  At L5-S1, he has maintenance of the disc height with no abnormal motion between flexion and extension.  On the MRI one can corroborate that he does have significant lateral recess stenosis for the L5 nerve root.    IMPRESSION:    Impression on the basis of the films is that Mr. Defino's pain is most likely generated by the L5-S1 joint secondary facet  arthrosis  and local radicular pain generated in the L5 nerve root.    I suggested as a first step that we do bilateral transforaminal blocks. Mr. Madariaga has some skepticism about this.  He tells me that he has had these blocks previously prior to his other surgeries and they rarely gave him any relief at all.  Nonetheless, I believe that in this situation he may indeed get good relief.  The only way is to do a set of blocks and in his case I would suggest bilateral transforaminal blocks at L5-S1.  If this is successful, we may be able to manage him conservatively.  If not, we will have to  discuss surgical options.    As regards to surgical options, I indicated what my plan would be and that would be to do decompression and arthrodesis of the L5-S1 joint.  There is no evidence of a listhesis at this time; however, with the marked facet hypertrophy that he has he would require such resection and undercutting of the facet joints so as to render these joints unstable.  In that regard, I believe he should undergo decompression and fusion if surgery is contemplated. Hopefully, we will be able to avoid the need for any surgical intervention.  We will do the epidural steroid injections first.

## 2012-06-19 MED ORDER — OXYCODONE-ACETAMINOPHEN 10-325 MG PO TABS: 1.0000 | ORAL_TABLET | ORAL | Status: AC | PRN

## 2012-06-19 NOTE — Progress Notes (Signed)
Orthopedic Tech Progress Note Patient Details:  Seth Schwartz Jun 28, 1936 [id number]  Patient ID: [name], male   DOB: 03/25/1936, 76 y.o.   MRN: [id number]   [name] 06/19/2012, 11:25 AM LUMBAR FUSION BRACE COMPLETE BY BIO TECH

## 2012-06-19 NOTE — Progress Notes (Signed)
I agree with the following treatment note after reviewing documentation.   Johnston, Ahmarion Saraceno Brynn   OTR/L Pager: [phone] Office: [phone] .   

## 2012-06-19 NOTE — Progress Notes (Signed)
I agree with the following treatment note after reviewing documentation.   Johnston, Aralyn Nowak Brynn   OTR/L Pager: [phone] Office: [phone] .   

## 2012-06-19 NOTE — Progress Notes (Signed)
Patient ID: Seth Schwartz, male   DOB: 07-Dec-1935, 76 y.o.   MRN: [id number] Vital signs stable. Motor function intact in lower extremities. Patient offers no complaints wishes to go home  Incision is clean and dry dressing intact. Motor function normal in lower extremities. Station and gait normal.  Plan discharge home this evening

## 2012-06-19 NOTE — Evaluation (Signed)
Physical Therapy Evaluation Patient Details Name: Seth Schwartz MRN: [id number] DOB: 05/26/1936 Today's Date: 06/19/2012 Time: 4098-1191 PT Time Calculation (min): 21 min  PT Assessment / Plan / Recommendation Clinical Impression  Pt is a 76 y/o male admitted s/p L4-5 laminectomy with decompression.  Pt is modified independent for all mobility except supervision for stairs.  Pt with necessary assist from wife at home.  No further needs identified.  Signing off without follow-up.    PT Assessment  Patent does not need any further PT services    Follow Up Recommendations  No PT follow up    Barriers to Discharge        Equipment Recommendations  None recommended by PT    Recommendations for Other Services     Frequency      Precautions / Restrictions Precautions Precautions: Back Precaution Booklet Issued: No Precaution Comments: Re-educated on 3/3 back precautions.  Pt able to verbalize precautions. Required Braces or Orthoses: Spinal Brace Spinal Brace: Lumbar corset;Applied in sitting position Restrictions Weight Bearing Restrictions: No   Pertinent Vitals/Pain 7/10 in back.  Pt repositioned.      Mobility  Bed Mobility Bed Mobility: Not assessed Rolling Left: 7: Independent Left Sidelying to Sit: 4: Min guard Sitting - Scoot to Edge of Bed: 7: Independent Details for Bed Mobility Assistance: Pt. LLE has some weakness but able to perform bed mobility with min guard from side lying. Pt. educated on proper technique to get out of bed in order to adhere to back precautions.  Transfers Transfers: Sit to Stand;Stand to Sit Sit to Stand: 6: Modified independent (Device/Increase time);From chair/3-in-1 Stand to Sit: 6: Modified independent (Device/Increase time);To chair/3-in-1 Ambulation/Gait Ambulation/Gait Assistance: 6: Modified independent (Device/Increase time) Ambulation Distance (Feet): 250 Feet Assistive device: None Gait Pattern: Step-through  pattern;Decreased stride length Stairs: Yes Stairs Assistance: 5: Supervision Stairs Assistance Details (indicate cue type and reason): Verbal cues for safe sequence using "up with good, down with bad."  Pt's left LE slightly weaker than right (thus is "bad" for stair sequence). Stair Management Technique: One rail Left Number of Stairs: 13  (Also up/down 2 steps no rail with min (guard) for balance.) Wheelchair Mobility Wheelchair Mobility: No    Exercises     PT Diagnosis:    PT Problem List:   PT Treatment Interventions:     PT Goals    Visit Information  Last PT Received On: 06/19/12 Assistance Needed: +1    Subjective Data  Subjective: "I am doing just fine." Patient Stated Goal: Go home.   Prior Functioning  Home Living Lives With: Spouse Available Help at Discharge: Family Type of Home: House Home Access: Stairs to enter Secretary/administrator of Steps: 2 Entrance Stairs-Rails: None Home Layout: Two level Alternate Level Stairs-Number of Steps: 13 Alternate Level Stairs-Rails: Left Bathroom Shower/Tub: Health visitor: Handicapped height Home Adaptive Equipment: Raised toilet seat with rails;Shower chair with back Prior Function Level of Independence: Independent Able to Take Stairs?: Yes Driving: Yes Vocation: Retired Musician: No difficulties Dominant Hand: Right    Cognition  Overall Cognitive Status: Appears within functional limits for tasks assessed/performed Arousal/Alertness: Awake/alert Orientation Level: Appears intact for tasks assessed Behavior During Session: [facility] for tasks performed    Extremity/Trunk Assessment Right Upper Extremity Assessment RUE ROM/Strength/Tone: Within functional levels Left Upper Extremity Assessment LUE ROM/Strength/Tone: Within functional levels Right Lower Extremity Assessment RLE ROM/Strength/Tone: Within functional levels RLE Sensation: WFL - Light Touch RLE Coordination:  WFL - gross motor Left  Lower Extremity Assessment LLE ROM/Strength/Tone: Within functional levels LLE Sensation: WFL - Light Touch LLE Coordination: WFL - gross motor Trunk Assessment Trunk Assessment: Normal   Balance Balance Balance Assessed: No  End of Session PT - End of Session Equipment Utilized During Treatment: Gait belt;Back brace Activity Tolerance: Patient tolerated treatment well Patient left: in chair;with call bell/phone within reach;with family/visitor present Nurse Communication: Mobility status  GP     Cephus Shelling 06/19/2012, 10:24 AM  06/19/2012 Cephus Shelling, PT, DPT [phone]

## 2012-06-19 NOTE — Progress Notes (Signed)
UR COMPLETED  

## 2012-06-19 NOTE — Discharge Summary (Signed)
  Admitting diagnosis: Lumbar spondylosis and stenosis L5-S1 with lumbar radiculopathy status post arthrodesis L2-L5 Discharge and final diagnosis: Lumbar spondylosis and stenosis L5-S1 with lumbar radiculopathy, status post arthrodesis L2-L5 Condition on discharge: Improved Major operation: Lumbar decompression L5-S1 decompression of L5 and S1 nerve roots posterior lumbar interbody arthrodesis using peek spacers local autograft and allograft pedicle screw fixation L5-S1 with posterior lateral arthrodesis using local autograft and allograft Medications on discharge: Percocet 07/11/2024 #60 no refills

## 2012-06-19 NOTE — Progress Notes (Signed)
PT. AMBULATING IN HALL WITH STEADY GAIT, TOLERATING DIET AND PO PAIN MEDICATIONS. PT. ABLE TO PUT LUMBAR BRACE ON AND OFF AT WILL. V/S AT 1928 TEMP 98.4, P=96, B/P=98/60, R=18, SAT 94% ON ROOM AIR. PT. AND SPOUSE VERBALIZED UNDERSTANDING OF D/C ORDERS. PRESCRIPTION FOR PERCOCET GIVEN TO PT.  PT. ASSISTED TO CAR BY STAFF AND FRIEND.  SPOUSE ASSISTING PT. HOME. NO DISTRESS NOTED.  CHRIS Kymia Simi RN

## 2012-06-19 NOTE — Progress Notes (Signed)
Occupational Therapy Evaluation Patient Details Name: EMITT BURGET MRN: [id number] DOB: 11-01-1935 Today's Date: 06/19/2012 Time: 1478-2956 OT Time Calculation (min): 30 min  OT Assessment / Plan / Recommendation Clinical Impression  Pt. 76 yo male with solid athrodesis L2-L5. marked facet hypertrophy at L5-S1 with impingement at L5 nerve root. Pt. and wife educated on back precautions and bed mobility. No current needs for OT    OT Assessment  Patient does not need any further OT services    Follow Up Recommendations  No OT follow up    Barriers to Discharge      Equipment Recommendations  None recommended by OT    Recommendations for Other Services    Frequency       Precautions / Restrictions Precautions Precautions: Back Precaution Booklet Issued: Yes (comment) Precaution Comments: Pt. given back precaution handout. Verbalized and demonstrated understainding Required Braces or Orthoses: Spinal Brace Restrictions Weight Bearing Restrictions: No   Pertinent Vitals/Pain     ADL  Eating/Feeding: Performed;Independent Where Assessed - Eating/Feeding: Bed level Grooming: Performed;Teeth care;Wash/dry hands;Supervision/safety Where Assessed - Grooming: Unsupported standing Upper Body Bathing: Simulated;Supervision/safety Where Assessed - Upper Body Bathing: Supported sitting Upper Body Dressing: Performed;Supervision/safety Where Assessed - Upper Body Dressing: Unsupported sitting Lower Body Dressing: Performed;Minimal assistance (With help from wife to don pants up to knees) Where Assessed - Lower Body Dressing: Unsupported sitting Toilet Transfer: Performed;Supervision/safety Toilet Transfer Method: Sit to Barista: Raised toilet seat with arms (or 3-in-1 over toilet) Toileting - Clothing Manipulation and Hygiene: Simulated;Supervision/safety Where Assessed - Engineer, mining and Hygiene: Sit to stand from 3-in-1 or  toilet Tub/Shower Transfer: Performed;Supervision/safety Tub/Shower Transfer Method: Science writer: Shower seat with back;Walk in shower Equipment Used: Gait belt;Back brace Transfers/Ambulation Related to ADLs: Pt. able to ambulate independently. required verbal cues not to break back precautions (specifically bending) ADL Comments: Pt. reminded with verbal cues not to bend while brushing teeth. Wife in room and demonstrated ability to help don back brace and assist pt with lower body dressing.    OT Diagnosis:    OT Problem List:   OT Treatment Interventions:     OT Goals    Visit Information  Last OT Received On: 06/19/12 Assistance Needed: +1    Subjective Data  Subjective: "I wish someone would have told me sooner about my back precautions" Patient Stated Goal: "To be able to go up the stairs"   Prior Functioning  Vision/Perception  Home Living Lives With: Spouse Available Help at Discharge: Family Type of Home: House Home Layout: Two level Alternate Level Stairs-Rails: Right Bathroom Shower/Tub: Walk-in shower (slight step up to get in ) Bathroom Toilet: Handicapped height Home Adaptive Equipment: Raised toilet seat with rails;Shower chair with back Prior Function Level of Independence: Independent Able to Take Stairs?: Yes Driving: Yes Communication Communication: No difficulties Dominant Hand: Right   Perception Perception: Within Functional Limits  Cognition  Overall Cognitive Status: Appears within functional limits for tasks assessed/performed Arousal/Alertness: Awake/alert Orientation Level: Appears intact for tasks assessed Behavior During Session: [facility] for tasks performed    Extremity/Trunk Assessment Right Upper Extremity Assessment RUE ROM/Strength/Tone: Within functional levels Left Upper Extremity Assessment LUE ROM/Strength/Tone: Within functional levels   Mobility    Bed Mobility Bed Mobility: Rolling Left;Left  Sidelying to Sit;Sitting - Scoot to Edge of Bed Rolling Left: 7: Independent Left Sidelying to Sit: 4: Min guard Sitting - Scoot to Edge of Bed: 7: Independent Details for Bed Mobility Assistance: Pt. LLE  has some weakness but able to perform bed mobility with min guard from side lying. Pt. educated on proper technique to get out of bed in order to adhere to back precautions.  Transfers Transfers: Sit to Stand;Stand to Sit Sit to Stand: 6: Modified independent (Device/Increase time);From toilet Stand to Sit: 6: Modified independent (Device/Increase time);To bed;To toilet                 End of Session OT - End of Session Equipment Utilized During Treatment: Gait belt;Back brace Activity Tolerance: Patient tolerated treatment well Patient left: in bed;with call bell/phone within reach;with nursing in room;with family/visitor present Nurse Communication: Mobility status;Precautions  GO     Cleora Fleet 06/19/2012, 9:24 AM

## 2012-06-19 NOTE — Progress Notes (Signed)
Occupational Therapy Discharge Patient Details Name: Seth Schwartz MRN: [id number] DOB: 06-17-1936 Today's Date: 06/19/2012 Time: 4098-1191 OT Time Calculation (min): 30 min  Patient discharged from OT services secondary to verbalizes and demonstrates understanding of back precautions. Wife also able to demonstrate ability to assist pt. with needs. .  Please see latest therapy progress note for current level of functioning and progress toward goals.    Progress and discharge plan discussed with patient and/or caregiver: Patient/Caregiver agrees with plan  GO     Cleora Fleet 06/19/2012, 9:25 AM

## 2012-06-24 MED FILL — Sodium Chloride IV Soln 0.9%: INTRAVENOUS | Qty: 2000 | Status: AC

## 2012-10-28 ENCOUNTER — Other Ambulatory Visit: Payer: Self-pay | Admitting: Neurological Surgery

## 2012-10-28 DIAGNOSIS — S129XXA Fracture of neck, unspecified, initial encounter: Secondary | ICD-10-CM

## 2012-10-30 ENCOUNTER — Ambulatory Visit
Admission: RE | Admit: 2012-10-30 | Discharge: 2012-10-30 | Disposition: A | Discharge status: Home / Self Care | Payer: Medicare Other | Source: Ambulatory Visit | Attending: Neurological Surgery | Admitting: Neurological Surgery

## 2012-10-30 DIAGNOSIS — S129XXA Fracture of neck, unspecified, initial encounter: Secondary | ICD-10-CM

## 2012-12-06 ENCOUNTER — Other Ambulatory Visit: Payer: Self-pay | Admitting: Neurological Surgery

## 2012-12-06 ENCOUNTER — Encounter (HOSPITAL_COMMUNITY): Payer: Self-pay | Admitting: Pharmacy Technician

## 2012-12-12 ENCOUNTER — Encounter (HOSPITAL_COMMUNITY)
Admission: RE | Admit: 2012-12-12 | Discharge: 2012-12-12 | Disposition: A | Discharge status: Home / Self Care | Payer: Medicare Other | Source: Ambulatory Visit | Attending: Neurological Surgery | Admitting: Neurological Surgery

## 2012-12-12 ENCOUNTER — Encounter (HOSPITAL_COMMUNITY): Payer: Self-pay

## 2012-12-12 HISTORY — DX: Unspecified osteoarthritis, unspecified site: M19.90

## 2012-12-12 LAB — SURGICAL PCR SCREEN
MRSA, PCR: NEGATIVE
Staphylococcus aureus: NEGATIVE

## 2012-12-12 LAB — CBC
HCT: 51.3 % (ref 39.0–52.0)
Hemoglobin: 18.2 g/dL — ABNORMAL HIGH (ref 13.0–17.0)
MCH: 30.8 pg (ref 26.0–34.0)
MCHC: 35.5 g/dL (ref 30.0–36.0)
MCV: 86.9 fL (ref 78.0–100.0)
Platelets: 246 10*3/uL (ref 150–400)
RBC: 5.9 MIL/uL — ABNORMAL HIGH (ref 4.22–5.81)
RDW: 13.8 % (ref 11.5–15.5)
WBC: 8 10*3/uL (ref 4.0–10.5)

## 2012-12-12 LAB — COMPREHENSIVE METABOLIC PANEL
ALT: 34 U/L (ref 0–53)
AST: 26 U/L (ref 0–37)
Albumin: 4 g/dL (ref 3.5–5.2)
Alkaline Phosphatase: 117 U/L (ref 39–117)
BUN: 14 mg/dL (ref 6–23)
CO2: 29 mEq/L (ref 19–32)
Calcium: 10.6 mg/dL — ABNORMAL HIGH (ref 8.4–10.5)
Chloride: 102 mEq/L (ref 96–112)
Creatinine, Ser: 1.04 mg/dL (ref 0.50–1.35)
GFR calc Af Amer: 80 mL/min — ABNORMAL LOW (ref >90)
GFR calc non Af Amer: 69 mL/min — ABNORMAL LOW (ref >90)
Glucose, Bld: 89 mg/dL (ref 70–99)
Potassium: 4.4 mEq/L (ref 3.5–5.1)
Sodium: 141 mEq/L (ref 135–145)
Total Bilirubin: 0.6 mg/dL (ref 0.3–1.2)
Total Protein: 7.2 g/dL (ref 6.0–8.3)

## 2012-12-12 NOTE — Pre-Procedure Instructions (Signed)
Seth Schwartz  12/12/2012   Your procedure is scheduled on: Tuesday, March 11th   Report to [facility] at 11:00 AM.   Call this number if you have problems the morning of surgery: [phone]   Remember:   Do not eat food or drink liquids after midnight Monday.   Take these medicines the morning of surgery with A SIP OF WATER: Zyrtec, Pain               Medication, Prilosec, Robaxin   Do not wear jewelry  Do not wear lotions, powders, or colognes. You may NOT wear deodorant.   Men may shave face and neck.  Do not bring valuables to the hospital.   Contacts, dentures or bridgework may not be worn into surgery.  Leave suitcase in the car. After surgery it may be brought to your room.   For patients admitted to the hospital, checkout time is 11:00 AM the day of discharge.     Name and phone number of your driver:    Special Instructions: Shower using CHG 2 nights before surgery and the night before surgery.  If you shower the day of surgery use CHG.  Use special wash - you have one bottle of CHG for all showers.  You should use approximately 1/3 of the bottle for each shower.   Please read over the following fact sheets that you were given: Pain Booklet, Coughing and Deep Breathing, MRSA Information and Surgical Site Infection Prevention

## 2012-12-12 NOTE — Progress Notes (Signed)
IN 2005, AS PART OF [facility], HE HAD STRESS TEST BY DR. LITTLE.---"NEARLY KILLED ME, BUT IT CAME OUT NORMAL"..........denies any problems...hasn't seen a cardio since.  DA

## 2012-12-13 ENCOUNTER — Inpatient Hospital Stay (HOSPITAL_COMMUNITY): Admission: RE | Admit: 2012-12-13 | Payer: Medicare Other | Source: Ambulatory Visit

## 2012-12-16 MED ORDER — VANCOMYCIN HCL IN DEXTROSE 1-5 GM/200ML-% IV SOLN
1000.0000 mg | INTRAVENOUS | Status: AC
  Administered 2012-12-17: 1000 mg via INTRAVENOUS
  Filled 2012-12-16: qty 200

## 2012-12-17 ENCOUNTER — Encounter (HOSPITAL_COMMUNITY)
Admission: RE | Disposition: A | Discharge status: Home / Self Care | Payer: Self-pay | Source: Ambulatory Visit | Attending: Neurological Surgery

## 2012-12-17 ENCOUNTER — Encounter (HOSPITAL_COMMUNITY): Payer: Self-pay | Admitting: Anesthesiology

## 2012-12-17 ENCOUNTER — Ambulatory Visit (HOSPITAL_COMMUNITY): Payer: Medicare Other | Admitting: Anesthesiology

## 2012-12-17 ENCOUNTER — Ambulatory Visit (HOSPITAL_COMMUNITY): Payer: Medicare Other

## 2012-12-17 ENCOUNTER — Ambulatory Visit (HOSPITAL_COMMUNITY)
Admission: RE | Admit: 2012-12-17 | Discharge: 2012-12-18 | Disposition: A | Discharge status: Home / Self Care | Payer: Medicare Other | Source: Ambulatory Visit | Attending: Neurological Surgery | Admitting: Neurological Surgery

## 2012-12-17 DIAGNOSIS — Z981 Arthrodesis status: Secondary | ICD-10-CM | POA: Insufficient documentation

## 2012-12-17 DIAGNOSIS — M4712 Other spondylosis with myelopathy, cervical region: Secondary | ICD-10-CM | POA: Insufficient documentation

## 2012-12-17 DIAGNOSIS — T84498A Other mechanical complication of other internal orthopedic devices, implants and grafts, initial encounter: Secondary | ICD-10-CM | POA: Insufficient documentation

## 2012-12-17 DIAGNOSIS — K219 Gastro-esophageal reflux disease without esophagitis: Secondary | ICD-10-CM | POA: Insufficient documentation

## 2012-12-17 DIAGNOSIS — I10 Essential (primary) hypertension: Secondary | ICD-10-CM | POA: Insufficient documentation

## 2012-12-17 DIAGNOSIS — Z01812 Encounter for preprocedural laboratory examination: Secondary | ICD-10-CM | POA: Insufficient documentation

## 2012-12-17 DIAGNOSIS — M5000 Cervical disc disorder with myelopathy, unspecified cervical region: Secondary | ICD-10-CM

## 2012-12-17 DIAGNOSIS — Y831 Surgical operation with implant of artificial internal device as the cause of abnormal reaction of the patient, or of later complication, without mention of misadventure at the time of the procedure: Secondary | ICD-10-CM | POA: Insufficient documentation

## 2012-12-17 HISTORY — PX: ANTERIOR CERVICAL DECOMP/DISCECTOMY FUSION: SHX1161

## 2012-12-17 SURGERY — ANTERIOR CERVICAL DECOMPRESSION/DISCECTOMY FUSION 2 LEVELS
Anesthesia: General | Site: Neck | Wound class: Clean

## 2012-12-17 MED ORDER — LOSARTAN POTASSIUM 50 MG PO TABS
50.0000 mg | ORAL_TABLET | Freq: Every day | ORAL | Status: DC
  Filled 2012-12-17: qty 1

## 2012-12-17 MED ORDER — PHENOL 1.4 % MT LIQD: 1.0000 | OROMUCOSAL | Status: DC | PRN

## 2012-12-17 MED ORDER — FENTANYL CITRATE 0.05 MG/ML IJ SOLN
INTRAMUSCULAR | Status: DC | PRN
  Administered 2012-12-17 (×2): 50 ug via INTRAVENOUS
  Administered 2012-12-17 (×2): 100 ug via INTRAVENOUS
  Administered 2012-12-17 (×2): 50 ug via INTRAVENOUS
  Administered 2012-12-17: 100 ug via INTRAVENOUS

## 2012-12-17 MED ORDER — DEXAMETHASONE SODIUM PHOSPHATE 4 MG/ML IJ SOLN: 4.0000 mg | Freq: Once | INTRAMUSCULAR | Status: AC

## 2012-12-17 MED ORDER — GLYCOPYRROLATE 0.2 MG/ML IJ SOLN
INTRAMUSCULAR | Status: DC | PRN
  Administered 2012-12-17: 0.2 mg via INTRAVENOUS

## 2012-12-17 MED ORDER — ACETAMINOPHEN 325 MG PO TABS: 650.0000 mg | ORAL_TABLET | ORAL | Status: DC | PRN

## 2012-12-17 MED ORDER — TRAZODONE HCL 100 MG PO TABS
100.0000 mg | ORAL_TABLET | Freq: Every day | ORAL | Status: DC
  Administered 2012-12-17: 100 mg via ORAL
  Filled 2012-12-17 (×2): qty 1

## 2012-12-17 MED ORDER — PROPOFOL 10 MG/ML IV BOLUS
INTRAVENOUS | Status: DC | PRN
  Administered 2012-12-17: 300 mg via INTRAVENOUS

## 2012-12-17 MED ORDER — ONDANSETRON HCL 4 MG/2ML IJ SOLN: 4.0000 mg | INTRAMUSCULAR | Status: DC | PRN

## 2012-12-17 MED ORDER — NEOSTIGMINE METHYLSULFATE 1 MG/ML IJ SOLN
INTRAMUSCULAR | Status: DC | PRN
  Administered 2012-12-17: 2 mg via INTRAVENOUS

## 2012-12-17 MED ORDER — ONDANSETRON HCL 4 MG/2ML IJ SOLN
INTRAMUSCULAR | Status: DC | PRN
  Administered 2012-12-17: 4 mg via INTRAVENOUS

## 2012-12-17 MED ORDER — ACETAMINOPHEN 650 MG RE SUPP: 650.0000 mg | RECTAL | Status: DC | PRN

## 2012-12-17 MED ORDER — ACETAMINOPHEN 10 MG/ML IV SOLN
INTRAVENOUS | Status: AC
  Administered 2012-12-17: 1000 mg via INTRAVENOUS
  Filled 2012-12-17: qty 100

## 2012-12-17 MED ORDER — DEXAMETHASONE 4 MG PO TABS
4.0000 mg | ORAL_TABLET | Freq: Once | ORAL | Status: AC
  Administered 2012-12-18: 4 mg via ORAL
  Filled 2012-12-17: qty 1

## 2012-12-17 MED ORDER — SODIUM CHLORIDE 0.9 % IV SOLN
INTRAVENOUS | Status: AC
Start: 2012-12-17 — End: 2012-12-18
  Filled 2012-12-17: qty 500

## 2012-12-17 MED ORDER — DEXAMETHASONE SODIUM PHOSPHATE 10 MG/ML IJ SOLN
INTRAMUSCULAR | Status: AC
  Administered 2012-12-17: 10 mg via INTRAVENOUS
  Filled 2012-12-17: qty 1

## 2012-12-17 MED ORDER — 0.9 % SODIUM CHLORIDE (POUR BTL) OPTIME
TOPICAL | Status: DC | PRN
  Administered 2012-12-17: 1000 mL

## 2012-12-17 MED ORDER — BUPIVACAINE HCL (PF) 0.5 % IJ SOLN
INTRAMUSCULAR | Status: DC | PRN
  Administered 2012-12-17: 30 mL

## 2012-12-17 MED ORDER — DIAZEPAM 5 MG PO TABS
ORAL_TABLET | ORAL | Status: AC
  Filled 2012-12-17: qty 1

## 2012-12-17 MED ORDER — THROMBIN 5000 UNITS EX SOLR
OROMUCOSAL | Status: DC | PRN
  Administered 2012-12-17: 16:00:00 via TOPICAL

## 2012-12-17 MED ORDER — PHENYLEPHRINE HCL 10 MG/ML IJ SOLN
INTRAMUSCULAR | Status: DC | PRN
  Administered 2012-12-17 (×5): 80 ug via INTRAVENOUS

## 2012-12-17 MED ORDER — SODIUM CHLORIDE 0.9 % IV SOLN: 250.0000 mL | INTRAVENOUS | Status: DC

## 2012-12-17 MED ORDER — FLUVOXAMINE MALEATE 50 MG PO TABS
50.0000 mg | ORAL_TABLET | Freq: Every morning | ORAL | Status: DC
  Administered 2012-12-17: 50 mg via ORAL
  Filled 2012-12-17 (×2): qty 1

## 2012-12-17 MED ORDER — ONDANSETRON HCL 4 MG/2ML IJ SOLN: 4.0000 mg | Freq: Once | INTRAMUSCULAR | Status: DC | PRN

## 2012-12-17 MED ORDER — DIAZEPAM 5 MG PO TABS
5.0000 mg | ORAL_TABLET | Freq: Four times a day (QID) | ORAL | Status: DC | PRN
  Administered 2012-12-17: 5 mg via ORAL

## 2012-12-17 MED ORDER — EPHEDRINE SULFATE 50 MG/ML IJ SOLN
INTRAMUSCULAR | Status: DC | PRN
  Administered 2012-12-17 (×3): 10 mg via INTRAVENOUS

## 2012-12-17 MED ORDER — HEMOSTATIC AGENTS (NO CHARGE) OPTIME
TOPICAL | Status: DC | PRN
  Administered 2012-12-17: 1 via TOPICAL

## 2012-12-17 MED ORDER — MORPHINE SULFATE 2 MG/ML IJ SOLN
1.0000 mg | INTRAMUSCULAR | Status: DC | PRN
  Administered 2012-12-17: 2 mg via INTRAVENOUS
  Filled 2012-12-17: qty 1

## 2012-12-17 MED ORDER — MIDAZOLAM HCL 5 MG/5ML IJ SOLN
INTRAMUSCULAR | Status: DC | PRN
  Administered 2012-12-17: 2 mg via INTRAVENOUS

## 2012-12-17 MED ORDER — HYDROMORPHONE HCL PF 1 MG/ML IJ SOLN: 0.2500 mg | INTRAMUSCULAR | Status: DC | PRN

## 2012-12-17 MED ORDER — MENTHOL 3 MG MT LOZG: 1.0000 | LOZENGE | OROMUCOSAL | Status: DC | PRN

## 2012-12-17 MED ORDER — PANTOPRAZOLE SODIUM 40 MG PO TBEC: 40.0000 mg | DELAYED_RELEASE_TABLET | Freq: Every day | ORAL | Status: DC

## 2012-12-17 MED ORDER — LIDOCAINE HCL 4 % MT SOLN
OROMUCOSAL | Status: DC | PRN
  Administered 2012-12-17: 4 mL via TOPICAL

## 2012-12-17 MED ORDER — SODIUM CHLORIDE 0.9 % IJ SOLN: 3.0000 mL | INTRAMUSCULAR | Status: DC | PRN

## 2012-12-17 MED ORDER — LACTATED RINGERS IV SOLN
INTRAVENOUS | Status: DC | PRN
  Administered 2012-12-17 (×2): via INTRAVENOUS

## 2012-12-17 MED ORDER — HYDROMORPHONE HCL PF 1 MG/ML IJ SOLN
0.2500 mg | INTRAMUSCULAR | Status: DC | PRN
  Administered 2012-12-17 (×3): 0.5 mg via INTRAVENOUS
  Administered 2012-12-17: 18:00:00 via INTRAVENOUS

## 2012-12-17 MED ORDER — SODIUM CHLORIDE 0.9 % IJ SOLN: 3.0000 mL | Freq: Two times a day (BID) | INTRAMUSCULAR | Status: DC

## 2012-12-17 MED ORDER — CEFAZOLIN SODIUM 1-5 GM-% IV SOLN: 1.0000 g | Freq: Three times a day (TID) | INTRAVENOUS | Status: DC

## 2012-12-17 MED ORDER — BACITRACIN 50000 UNITS IM SOLR
INTRAMUSCULAR | Status: AC
  Filled 2012-12-17: qty 1

## 2012-12-17 MED ORDER — HYDROMORPHONE HCL PF 1 MG/ML IJ SOLN
INTRAMUSCULAR | Status: AC
  Filled 2012-12-17: qty 1

## 2012-12-17 MED ORDER — ROCURONIUM BROMIDE 100 MG/10ML IV SOLN
INTRAVENOUS | Status: DC | PRN
  Administered 2012-12-17: 50 mg via INTRAVENOUS
  Administered 2012-12-17: 10 mg via INTRAVENOUS

## 2012-12-17 MED ORDER — OXYCODONE-ACETAMINOPHEN 5-325 MG PO TABS
ORAL_TABLET | ORAL | Status: AC
  Administered 2012-12-17: 1
  Filled 2012-12-17: qty 1

## 2012-12-17 MED ORDER — LIDOCAINE HCL (CARDIAC) 20 MG/ML IV SOLN
INTRAVENOUS | Status: DC | PRN
  Administered 2012-12-17: 80 mg via INTRAVENOUS

## 2012-12-17 MED ORDER — OXYCODONE-ACETAMINOPHEN 10-325 MG PO TABS: 1.0000 | ORAL_TABLET | ORAL | Status: DC | PRN

## 2012-12-17 MED ORDER — POLYVINYL ALCOHOL 1.4 % OP SOLN
1.0000 [drp] | OPHTHALMIC | Status: DC | PRN
  Administered 2012-12-17: 1 [drp] via OPHTHALMIC
  Filled 2012-12-17: qty 15

## 2012-12-17 MED ORDER — SODIUM CHLORIDE 0.9 % IR SOLN
Status: DC | PRN
  Administered 2012-12-17: 16:00:00

## 2012-12-17 MED ORDER — OXYCODONE HCL 5 MG PO TABS
5.0000 mg | ORAL_TABLET | ORAL | Status: DC | PRN
  Administered 2012-12-17 – 2012-12-18 (×3): 5 mg via ORAL
  Filled 2012-12-17 (×3): qty 1

## 2012-12-17 MED ORDER — DIAZEPAM 5 MG PO TABS
5.0000 mg | ORAL_TABLET | Freq: Four times a day (QID) | ORAL | Status: DC | PRN
  Administered 2012-12-17 – 2012-12-18 (×2): 5 mg via ORAL
  Filled 2012-12-17 (×2): qty 1

## 2012-12-17 MED ORDER — OXYCODONE-ACETAMINOPHEN 5-325 MG PO TABS
1.0000 | ORAL_TABLET | ORAL | Status: DC | PRN
  Administered 2012-12-17 – 2012-12-18 (×3): 1 via ORAL
  Filled 2012-12-17 (×3): qty 1

## 2012-12-17 MED ORDER — LIDOCAINE-EPINEPHRINE 1 %-1:100000 IJ SOLN
INTRAMUSCULAR | Status: DC | PRN
  Administered 2012-12-17: 20 mL

## 2012-12-17 SURGICAL SUPPLY — 57 items
ADH SKN CLS APL DERMABOND .7 (GAUZE/BANDAGES/DRESSINGS) ×1
ADH SKN CLS LQ APL DERMABOND (GAUZE/BANDAGES/DRESSINGS) ×1
BAG DECANTER FOR FLEXI CONT (MISCELLANEOUS) ×2 IMPLANT
BANDAGE GAUZE ELAST BULKY 4 IN (GAUZE/BANDAGES/DRESSINGS) IMPLANT
BIT DRILL 14MM (INSTRUMENTS) IMPLANT
BIT DRILL NEURO 2X3.1 SFT TUCH (MISCELLANEOUS) ×1 IMPLANT
BUR BARREL STRAIGHT FLUTE 4.0 (BURR) ×2 IMPLANT
CANISTER SUCTION 2500CC (MISCELLANEOUS) ×2 IMPLANT
CLOTH BEACON ORANGE TIMEOUT ST (SAFETY) ×2 IMPLANT
CONT SPEC 4OZ CLIKSEAL STRL BL (MISCELLANEOUS) ×2 IMPLANT
DECANTER SPIKE VIAL GLASS SM (MISCELLANEOUS) ×2 IMPLANT
DERMABOND ADHESIVE PROPEN (GAUZE/BANDAGES/DRESSINGS) ×1
DERMABOND ADVANCED (GAUZE/BANDAGES/DRESSINGS) ×1
DERMABOND ADVANCED .7 DNX12 (GAUZE/BANDAGES/DRESSINGS) ×1 IMPLANT
DERMABOND ADVANCED .7 DNX6 (GAUZE/BANDAGES/DRESSINGS) IMPLANT
DRAPE LAPAROTOMY 100X72 PEDS (DRAPES) ×2 IMPLANT
DRAPE MICROSCOPE LEICA (MISCELLANEOUS) IMPLANT
DRAPE POUCH INSTRU U-SHP 10X18 (DRAPES) ×2 IMPLANT
DRILL 14MM (INSTRUMENTS) ×2
DRILL NEURO 2X3.1 SOFT TOUCH (MISCELLANEOUS) ×2
DURAPREP 6ML APPLICATOR 50/CS (WOUND CARE) ×2 IMPLANT
ELECT REM PT RETURN 9FT ADLT (ELECTROSURGICAL) ×2
ELECTRODE REM PT RTRN 9FT ADLT (ELECTROSURGICAL) ×1 IMPLANT
GAUZE SPONGE 4X4 16PLY XRAY LF (GAUZE/BANDAGES/DRESSINGS) IMPLANT
GLOVE BIOGEL PI IND STRL 8.5 (GLOVE) ×1 IMPLANT
GLOVE BIOGEL PI INDICATOR 8.5 (GLOVE) ×1
GLOVE ECLIPSE 8.5 STRL (GLOVE) ×2 IMPLANT
GLOVE EXAM NITRILE LRG STRL (GLOVE) IMPLANT
GLOVE EXAM NITRILE MD LF STRL (GLOVE) IMPLANT
GLOVE EXAM NITRILE XL STR (GLOVE) IMPLANT
GLOVE EXAM NITRILE XS STR PU (GLOVE) IMPLANT
GOWN BRE IMP SLV AUR LG STRL (GOWN DISPOSABLE) IMPLANT
GOWN BRE IMP SLV AUR XL STRL (GOWN DISPOSABLE) IMPLANT
GOWN STRL REIN 2XL LVL4 (GOWN DISPOSABLE) ×2 IMPLANT
HEAD HALTER (SOFTGOODS) ×2 IMPLANT
HEMOSTAT POWDER SURGIFOAM 1G (HEMOSTASIS) ×1 IMPLANT
KIT BASIN OR (CUSTOM PROCEDURE TRAY) ×2 IMPLANT
KIT ROOM TURNOVER OR (KITS) ×2 IMPLANT
NDL SPNL 22GX3.5 QUINCKE BK (NEEDLE) ×1 IMPLANT
NEEDLE HYPO 22GX1.5 SAFETY (NEEDLE) ×2 IMPLANT
NEEDLE SPNL 22GX3.5 QUINCKE BK (NEEDLE) ×2 IMPLANT
NS IRRIG 1000ML POUR BTL (IV SOLUTION) ×2 IMPLANT
PACK LAMINECTOMY NEURO (CUSTOM PROCEDURE TRAY) ×2 IMPLANT
PAD ARMBOARD 7.5X6 YLW CONV (MISCELLANEOUS) ×6 IMPLANT
PLATE 16MM (Plate) ×2 IMPLANT
PUTTY BONE 2.5CC ×1 IMPLANT
RUBBERBAND STERILE (MISCELLANEOUS) IMPLANT
SCREW 14MM (Screw) ×8 IMPLANT
SPACER PAR CORT CERV S (Spacer) ×2 IMPLANT
SPONGE GAUZE 4X4 12PLY (GAUZE/BANDAGES/DRESSINGS) ×2 IMPLANT
SPONGE INTESTINAL PEANUT (DISPOSABLE) ×2 IMPLANT
SPONGE SURGIFOAM ABS GEL SZ50 (HEMOSTASIS) ×2 IMPLANT
SUT VIC AB 3-0 SH 8-18 (SUTURE) ×4 IMPLANT
SYR 20ML ECCENTRIC (SYRINGE) ×2 IMPLANT
TOWEL OR 17X24 6PK STRL BLUE (TOWEL DISPOSABLE) ×2 IMPLANT
TOWEL OR 17X26 10 PK STRL BLUE (TOWEL DISPOSABLE) ×2 IMPLANT
WATER STERILE IRR 1000ML POUR (IV SOLUTION) ×2 IMPLANT

## 2012-12-17 NOTE — Op Note (Signed)
Preoperative diagnosis: Pseudoarthrosis C6 C7, with broken hardware. Spondylosis with myelopathy C3-C4. Status post arthrodesis C4-C7 Postoperative diagnosis: Pseudoarthrosis C6-C7, with broken hardware. Spondylosis with myelopathy C3-C4. Status post arthrodesis C4-C7. Procedure: Removal of hardware from C4-C7. Anterior cervical decompression and arthrodesis with structural allograft C3-C4 anterior fixation with Alphatec plate. Revision of pseudoarthrosis C6-C7 with arthrodesis using allograft and anterior plate fixation W1-X9 with Alphatec plate.  Surgeon: Barnett Abu First assistant: Orbie Hurst Anesthesia: General endotracheal  Procedure: The patient was brought to the operating of supine on a stretcher after the smooth induction of general endotracheal anesthesia is placed in 5 pounds of halter traction. The neck was prepped with alcohol and DuraPrep and draped in a sterile fashion. A transverse incision was created on the left side of the midline. The dissection was taken to the platysma and then the sternocleidomastoid was dissected superiorly and inferiorly to reenter the plane of the prevertebral space. The dissection was carried down to the region of the hardware once the hardware was explored this plane around the hardware was opened cephalad and caudad. A screw from a cover of a BMI plate was then removed to remove a keeper plate. 3 such plates were encountered. The underlying screws were then removed. The fracture the plate below the C6 screws was identified. This was elevated and removed and a separate piece. The superior portion of the plate was then removed separately. The dissection was then carried superiorly to expose C3-C4. Self-retaining retractor was placed deep in the wound under longus coli muscle. The disc space was entered. A significant quantity of severely degenerated and desiccated disc material was encountered and this was removed. A high-speed bur was used to remove remnants of  disc material and shaved the endplates smooth to either lateral recess. The uncinate processes were then removed on either side to allow for decompression of significant foraminal stenosis at the C3-C4 level. Once this was decompressed and hemostasis in the epidural space was obtained meticulously with some pledgets of Gelfoam soaked in thrombin were later irrigated away his then decided to use some Surgifoam to help maintain the hemostasis in this region. The endplates are shaved smooth and ultimately a 6 mm tall cortical ring allograft was filled with demineralized bone matrix and placed into this interspace. A 16 mm Alphatec trestle plate was then fixed to the ventral aspect of the vertebral body using 4 variable angle 14 mm screws. The holes from the body of C4 were used to secure the inferior portion of the plate. Attention was then turned to C6-C7 were the pseudoarthrosis was explored a high-speed drill was used to clear bone and degenerated scar that was within the to arthritic space. When I annual trough was created a 5 mm cortical ring allograft was selected and this was placed into the interspace after being filled with demineralized bone matrix. A 16 mm plate was then fitted to the ventral aspect of this vertebral bodies at C6-C7 using a previously drilled screw and C7 as the anchor point. 14 mm screws were used here also care was taken then to explore the wound and cauterized any bleeding from the area of the plate removal. In the end good hemostasis was achieved a singular radiographs identified good position of the new plate at C3 C4 however the plate at J4-N8 could not be visualized. With good hemostasis being obtained the wound was closed with 3-0 Vicryl in the platysma and 3-0 Vicryl subcuticular tissue and Dermabond on the skin blood loss for the  operation was estimated at about 120 cc.

## 2012-12-17 NOTE — Anesthesia Preprocedure Evaluation (Addendum)
Anesthesia Evaluation  Patient identified by MRN, date of birth, ID band Patient awake    History of Anesthesia Complications Negative for: history of anesthetic complications  Airway Mallampati: II TM Distance: >3 FB Neck ROM: Full    Dental  (+) Dental Advisory Given, Teeth Intact and Upper Dentures   Pulmonary former smoker,          Cardiovascular hypertension, Pt. on medications Rhythm:regular Rate:Normal     Neuro/Psych  Headaches, PSYCHIATRIC DISORDERS Anxiety Depression    GI/Hepatic Neg liver ROS, GERD-  Medicated and Controlled,  Endo/Other  negative endocrine ROS  Renal/GU negative Renal ROS     Musculoskeletal negative musculoskeletal ROS (+)   Abdominal   Peds  Hematology negative hematology ROS (+)   Anesthesia Other Findings   Reproductive/Obstetrics negative OB ROS                         Anesthesia Physical Anesthesia Plan  ASA: II  Anesthesia Plan: General   Post-op Pain Management:    Induction: Intravenous  Airway Management Planned: Oral ETT  Additional Equipment:   Intra-op Plan:   Post-operative Plan: Extubation in OR  Informed Consent: I have reviewed the patients History and Physical, chart, labs and discussed the procedure including the risks, benefits and alternatives for the proposed anesthesia with the patient or authorized representative who has indicated his/her understanding and acceptance.     Plan Discussed with: CRNA, Anesthesiologist and Surgeon  Anesthesia Plan Comments:         Anesthesia Quick Evaluation

## 2012-12-17 NOTE — Preoperative (Signed)
Beta Blockers   Reason not to administer Beta Blockers:Not Applicable 

## 2012-12-17 NOTE — H&P (Signed)
Seth Schwartz is an 77 y.o. male.   Chief Complaint: Spondylosis with myelopathy C3-4, pseudoarthrosis at C6-C7 HPI: Patient is a 77 year old individual is had severe and extensive spondylitic disease in his lower lumbar spine having had an arthrodesis from L2 down to the sacrum. The patient has had difficulties with cervical spondylosis and is at previous decompression arthrodesis at C4-5 C5-6 and C6-C7. He has a pseudoarthrosis at C6-C7 however now is also noted that he had developed severe spondylosis with cord compression at the level of C3-C4. [name] been having severe neck pain with pain into both shoulders and numbness and dysesthesias into the hands. The MRI demonstrated this recently and the patient wants definitive treatment for this process. I indicated that he would require both an anterior decompression at C3-4 and revision arthrodesis at C6-C7. We're proceeding with the above procedure.  Past Medical History  Diagnosis Date  . Anxiety   . Depression   . GERD (gastroesophageal reflux disease)   . Headache   . Constipation   . Hypertension     dr Seth Schwartz little  . Arthritis     Past Surgical History  Procedure Laterality Date  . Back surgery      3 lumbars, 1 cervical  with fusion  . Hemorrhoid surgery    . Eye surgery      glaucoma  . Trigger finger release Left     middle finger    No family history on file. Social History:  reports that he has quit smoking. He does not have any smokeless tobacco history on file. He reports that he does not drink alcohol or use illicit drugs.  Allergies:  Allergies  Allergen Reactions  . Penicillins Hives  . Sulfa Antibiotics     Medications Prior to Admission  Medication Sig Dispense Refill  . Cholecalciferol (VITAMIN D) 2000 UNITS tablet Take 2,000 Units by mouth every evening.      . diazepam (VALIUM) 5 MG tablet Take 5 mg by mouth every 6 (six) hours as needed. For muscle pain      . diazepam (VALIUM) 5 MG tablet Take 5-10 mg  by mouth every 6 (six) hours as needed. For muscle spasm.      . fish oil-omega-3 fatty acids 1000 MG capsule Take 1 g by mouth 2 (two) times daily. On hold for procedure.      . fluvoxaMINE (LUVOX) 50 MG tablet Take 50 mg by mouth every morning.      Marland Kitchen losartan (COZAAR) 50 MG tablet Take 50 mg by mouth daily.      . Multiple Vitamin (MULTIVITAMIN WITH MINERALS) TABS Take 1 tablet by mouth daily.      Marland Kitchen omeprazole (PRILOSEC) 20 MG capsule Take 20 mg by mouth 2 (two) times daily.      Marland Kitchen OVER THE COUNTER MEDICATION Take 900 mg by mouth daily. Cholestoff      . oxyCODONE-acetaminophen (PERCOCET) 10-325 MG per tablet Take 0.5 tablets by mouth 3 (three) times daily. For pain      . testosterone cypionate (DEPOTESTOTERONE CYPIONATE) 100 MG/ML injection Inject 100 mg into the muscle See admin instructions. For IM use only. Take every 10 days      . traZODone (DESYREL) 50 MG tablet Take 100 mg by mouth at bedtime.      . vitamin B-12 (CYANOCOBALAMIN) 1000 MCG tablet Take 1,000 mcg by mouth daily.      Marland Kitchen aspirin EC 81 MG tablet Take 81 mg by mouth daily. On  hold for procedure.        No results found for this or any previous visit (from the past 48 hour(s)). No results found.  Review of Systems  Constitutional: Positive for malaise/fatigue.  HENT: Positive for neck pain.        Neck pain  Eyes: Negative.   Respiratory: Negative.   Cardiovascular: Negative.   Gastrointestinal: Negative.   Musculoskeletal: Positive for back pain.  Neurological: Positive for focal weakness, weakness and headaches.       Dysesthesias in both upper extremities dysesthesias in lower extremities  Endo/Heme/Allergies: Negative.   Psychiatric/Behavioral: Positive for depression.    Blood pressure 117/77, pulse 73, temperature 97.3 F (36.3 C), temperature source Oral, resp. rate 18, SpO2 96.00%. Physical Exam  Constitutional: He is oriented to person, place, and time. He appears well-developed and well-nourished.   HENT:  Head: Normocephalic and atraumatic.  Eyes: Conjunctivae and EOM are normal. Pupils are equal, round, and reactive to light.  Neck:  Limited range of motion of his neck turning only 30 to either side flexion and extension less than 50% of normal.  Cardiovascular: Regular rhythm.   Respiratory: Effort normal and breath sounds normal.  GI: Soft.  Musculoskeletal: Normal range of motion.  Neurological: He is alert and oriented to person, place, and time. No cranial nerve deficit.  Skin: Skin is warm.  Psychiatric: He has a normal mood and affect. His behavior is normal. Judgment and thought content normal.     Assessment/Plan  we reviewed an MRI of his neck which demonstrated that he had the pseudoarthrosis at C6-7 and a central disc protrusion at C3-4.  He also has a small disc protrusion at T1-T2. The biggest problem that Seth Schwartz continues to have he notes is centralized neck pain and headache.  He is not having much in the way of radicular symptoms.  In addition, he notes he still has the low back pain.  He finds that the symptoms in his neck tend to be quite unbearable at times and he is using a heating pad and all form of conservative management of this process to try to get some rest. He finds that the Trazodone allows him to get some sleep.  Nonetheless, he is quite distressed by his neck and his headaches.    I reviewed the MRI and note that he has two separate processes. One is a pseudoarthrosis at C6-7 which I believe contributes to the localized neck pain. The other is a central disc protrusion at C3-4 which effaces the cord and causes biforaminal stenosis.  I believe that this would require decompression and stabilization via an anterior approach.  He has a broken plate at the levels of C4 to C7 and in discussing the surgical options to treat this process, I indicated to Seth Schwartz that it may be best to approach this via anterior approach.  Seth Schwartz J 12/17/2012, 1:46 PM

## 2012-12-17 NOTE — Progress Notes (Signed)
Orthopedic Tech Progress Note Patient Details:  Seth Schwartz March 09, 1936 [id number]  Ortho Devices Type of Ortho Device: Soft collar Ortho Device/Splint Location: neck Ortho Device/Splint Interventions: Application   Jennye Moccasin 12/17/2012, 6:45 PM

## 2012-12-17 NOTE — Progress Notes (Signed)
Patient ID: Seth Schwartz, male   DOB: 09-23-1936, 77 y.o.   MRN: [id number] Motor function is intact. Incision is clean and dry. Patient is speaking able to swallow and feels reasonably comfortable. Stable

## 2012-12-17 NOTE — Anesthesia Postprocedure Evaluation (Signed)
  Anesthesia Post-op Note  Patient: Seth Schwartz  Procedure(s) Performed: Procedure(s) with comments: ANTERIOR CERVICAL DECOMPRESSION/DISCECTOMY FUSION 2 LEVELS (N/A) - Cervical three-four Anterior cervical decompression/diskectomy/fusion, with anterior revision of Cervical six-seven pseudoarthrosis  Patient Location: PACU  Anesthesia Type:General  Level of Consciousness: awake, alert  and oriented  Airway and Oxygen Therapy: Patient Spontanous Breathing and Patient connected to nasal cannula oxygen  Post-op Pain: mild  Post-op Assessment: Post-op Vital signs reviewed, Patient's Cardiovascular Status Stable, Respiratory Function Stable, Patent Airway and No signs of Nausea or vomiting  Post-op Vital Signs: Reviewed and stable  Complications: No apparent anesthesia complications

## 2012-12-17 NOTE — Transfer of Care (Signed)
Immediate Anesthesia Transfer of Care Note  Patient: [name]  Procedure(s) Performed: Procedure(s) with comments: ANTERIOR CERVICAL DECOMPRESSION/DISCECTOMY FUSION 2 LEVELS (N/A) - Cervical three-four Anterior cervical decompression/diskectomy/fusion, with anterior revision of Cervical six-seven pseudoarthrosis  Patient Location: PACU  Anesthesia Type:General  Level of Consciousness: awake, alert  and oriented  Airway & Oxygen Therapy: Patient Spontanous Breathing and Patient connected to nasal cannula oxygen  Post-op Assessment: Report given to PACU RN and Post -op Vital signs reviewed and stable  Post vital signs: Reviewed and stable  Complications: No apparent anesthesia complications

## 2012-12-18 MED ORDER — OXYCODONE HCL 5 MG PO TABS: 5.0000 mg | ORAL_TABLET | ORAL | Status: DC | PRN

## 2012-12-18 MED ORDER — DIAZEPAM 5 MG PO TABS: 5.0000 mg | ORAL_TABLET | Freq: Four times a day (QID) | ORAL | Status: DC | PRN

## 2012-12-18 NOTE — Progress Notes (Signed)
Pt and wife given D/C instructions with Rx's, verbal understanding given. Pt had requested to have a different Rx than what was ordered, called Dr. Verlee Rossetti office and spoke to [place]. Pt is going to go by the office for new Rx after D/C. Pt D/C'd home via wheelchair with wife @ 1015 per MD order. Rema Fendt, RN

## 2012-12-18 NOTE — Discharge Summary (Signed)
Physician Discharge Summary  Patient ID: Seth Schwartz MRN: [id number] DOB/AGE: 1935/11/28 77 y.o.  Admit date: 12/17/2012 Discharge date: 12/18/2012  Admission Diagnoses: Cervical spondylosis with myelopathy C3-C4, pseudoarthrosis C6 and C7 with fractured hardware  Discharge Diagnoses: Cervical spondylosis with myelopathy C3-C4, pseudoarthrosis C6-C7 with fractured hardware  Active Problems:   * No active hospital problems. *   Discharged Condition: good  Hospital Course: Patient was admitted to undergo surgical revision of a pseudoarthrosis at C6-C7 in addition to decompression at C3-C4 secondary to spondylitic stenosis and myelopathy he tolerated the procedure was well  Consults: None  Significant Diagnostic Studies: MRI cervical spine  Treatments: surgery: Anterior cervical decompression C3-4 with allograft arthrodesis anterior plate fixation, removal of previous hardware C4 to see 7 with revision of pseudoarthrosis C6-7 using ring allograft and anterior plate fixation.  Discharge Exam: Blood pressure 144/90, pulse 110, temperature 98.7 F (37.1 C), temperature source Oral, resp. rate 18, height 5' 10.08" (1.78 m), weight 86.9 kg (191 lb 9.3 oz), SpO2 95.00%. Incision is clean and dry motor function is intact swallowing is intact breathing no problem.  Disposition: 01-Home or Self Care  Discharge Orders   Future Orders Complete By Expires     Call MD for:  redness, tenderness, or signs of infection (pain, swelling, redness, odor or green/yellow discharge around incision site)  As directed     Call MD for:  severe uncontrolled pain  As directed     Call MD for:  temperature >100.4  As directed     Diet - low sodium heart healthy  As directed     Discharge instructions  As directed     Comments:      Okay to shower. Do not apply salves or appointments to incision. No heavy lifting with the upper extremities greater than 15 pounds. May resume driving when not requiring pain  medication and patient feels comfortable with doing so.    Increase activity slowly  As directed         Medication List    TAKE these medications       aspirin EC 81 MG tablet  Take 81 mg by mouth daily. On hold for procedure.     diazepam 5 MG tablet  Commonly known as:  VALIUM  Take 1 tablet (5 mg total) by mouth every 6 (six) hours as needed (Muscle spasm).     diazepam 5 MG tablet  Commonly known as:  VALIUM  Take 5 mg by mouth every 6 (six) hours as needed. For muscle pain     diazepam 5 MG tablet  Commonly known as:  VALIUM  Take 5-10 mg by mouth every 6 (six) hours as needed. For muscle spasm.     fish oil-omega-3 fatty acids 1000 MG capsule  Take 1 g by mouth 2 (two) times daily. On hold for procedure.     fluvoxaMINE 50 MG tablet  Commonly known as:  LUVOX  Take 50 mg by mouth every morning.     losartan 50 MG tablet  Commonly known as:  COZAAR  Take 50 mg by mouth daily.     multivitamin with minerals Tabs  Take 1 tablet by mouth daily.     omeprazole 20 MG capsule  Commonly known as:  PRILOSEC  Take 20 mg by mouth 2 (two) times daily.     OVER THE COUNTER MEDICATION  Take 900 mg by mouth daily. Cholestoff     oxyCODONE 5 MG immediate release tablet  Commonly known as:  Oxy IR/ROXICODONE  Take 1 tablet (5 mg total) by mouth every 4 (four) hours as needed for pain.     oxyCODONE-acetaminophen 10-325 MG per tablet  Commonly known as:  PERCOCET  Take 0.5 tablets by mouth 3 (three) times daily. For pain     testosterone cypionate 100 MG/ML injection  Commonly known as:  DEPOTESTOTERONE CYPIONATE  Inject 100 mg into the muscle See admin instructions. For IM use only. Take every 10 days     traZODone 50 MG tablet  Commonly known as:  DESYREL  Take 100 mg by mouth at bedtime.     vitamin B-12 1000 MCG tablet  Commonly known as:  CYANOCOBALAMIN  Take 1,000 mcg by mouth daily.     Vitamin D 2000 UNITS tablet  Take 2,000 Units by mouth every evening.          SignedStefani Dama 12/18/2012, 9:26 AM

## 2012-12-18 NOTE — Evaluation (Signed)
Occupational Therapy Evaluation Patient Details Name: Seth Schwartz MRN: [id number] DOB: 12-Oct-1935 Today's Date: 12/18/2012 Time: 4098-1191 OT Time Calculation (min): 18 min  OT Assessment / Plan / Recommendation Clinical Impression  Pt is overall Mod I functional mobility and transfers as well as LB ADL's. Cervical precautions were reviewed w/ pt & hand out issued. Pt reports he will d/c home w/ spouse PRN assist. No further OT needs, will sign off.    OT Assessment  Patient does not need any further OT services    Follow Up Recommendations  No OT follow up    Barriers to Discharge      Equipment Recommendations  None recommended by OT    Recommendations for Other Services    Frequency       Precautions / Restrictions Precautions Precautions: Cervical Precaution Comments: Cervical precautions handout issued and reviewed w/ pt Required Braces or Orthoses: Cervical Brace Cervical Brace: Soft collar;Other (comment) (Pt states "Dr said I didn't need to wear it") Restrictions Weight Bearing Restrictions: No   Pertinent Vitals/Pain No c/o per pt report during assessment.    ADL  Grooming: Performed;Wash/dry hands;Wash/dry face;Independent Where Assessed - Grooming: Unsupported standing Upper Body Dressing: Simulated;[name];Supervision/safety Where Assessed - Upper Body Dressing: Unsupported sitting Lower Body Dressing: Performed;Modified independent Where Assessed - Lower Body Dressing: Unsupported sit to stand Toilet Transfer: Performed;Independent Toilet Transfer Method: Sit to Barista: Regular height toilet Toileting - Clothing Manipulation and Hygiene: Simulated;Independent Where Assessed - Toileting Clothing Manipulation and Hygiene: Sit to stand from 3-in-1 or toilet;Standing Tub/Shower Transfer Method: Not assessed Transfers/Ambulation Related to ADLs: Pt is I functional mobility and transfers in room, bathroom. Pt also walked to RN desk  independently w/o AD ADL Comments: Pt is overall Mod I  ADL's and functional mobility. He was instructed in cervical precautions and handout was issued/reviewed for ADL's & self care. Pt reports that his spouse and family can PRN. Will sign off OT at this time, no further needs.     OT Diagnosis:    OT Problem List:   OT Treatment Interventions:     OT Goals    Visit Information  Last OT Received On: 12/18/12    Subjective Data  Subjective: "I've had a lot of surgeries"  "I'm hyper, I'll have to remember to be careful when I jump up out of bed" Patient Stated Goal: Home w/ spouse PRN assist later today   Prior Functioning     Home Living Lives With: Spouse Available Help at Discharge: Family Type of Home: House Home Access: Stairs to enter Secretary/administrator of Steps: 2 + 1 Entrance Stairs-Rails: None Home Layout: Two level;Bed/bath upstairs Alternate Level Stairs-Number of Steps: 14 steps Bathroom Shower/Tub: Tub/shower unit;Walk-in shower;Door [employer] Toilet: Pharmacist, community: Yes Home Psychiatrist with back Prior Function Level of Independence: Independent Able to Take Stairs?: Yes Driving: Yes Vocation: Retired Musician: No difficulties Dominant Hand: Right    Vision/Perception Vision - History Baseline Vision: Wears glasses all the time Visual History: Glaucoma Patient Visual Report: No change from baseline   Cognition  Cognition Overall Cognitive Status: Appears within functional limits for tasks assessed/performed Arousal/Alertness: Awake/alert Orientation Level: Appears intact for tasks assessed Behavior During Session: [facility] for tasks performed    Extremity/Trunk Assessment Right Upper Extremity Assessment RUE ROM/Strength/Tone: [facility] for tasks assessed Left Upper Extremity Assessment LUE ROM/Strength/Tone: [facility] for tasks assessed     Mobility Bed Mobility Bed Mobility: Supine to Sit;Sitting -  Scoot to Edge of Bed Supine to Sit: 6: Modified independent (Device/Increase time);HOB flat Sitting - Scoot to Edge of Bed: 7: Independent Transfers Transfers: Sit to Stand;Stand to Sit Sit to Stand: From bed;From chair/3-in-1;Without upper extremity assist;7: Independent Stand to Sit: 7: Independent;To bed;To chair/3-in-1 Details for Transfer Assistance: Occasional VC's for cervical precautions during functional mobility & transfers.       Balance Balance Balance Assessed: Yes Static Sitting Balance Static Sitting - Balance Support: No upper extremity supported;Feet supported Static Sitting - Level of Assistance: 7: Independent Static Standing Balance Static Standing - Balance Support: No upper extremity supported;During functional activity Static Standing - Level of Assistance: 7: Independent Dynamic Standing Balance Dynamic Standing - Balance Support: No upper extremity supported;During functional activity Dynamic Standing - Level of Assistance: 7: Independent   End of Session OT - End of Session Equipment Utilized During Treatment: Cervical collar Activity Tolerance: Patient tolerated treatment well Patient left: in chair;with call bell/phone within reach  GO Functional Assessment Tool Used: Clinical judgement Functional Limitation: Self care Self Care Current Status (W6568): 0 percent impaired, limited or restricted Self Care Goal Status (L2751): 0 percent impaired, limited or restricted Self Care Discharge Status (Z0017): 0 percent impaired, limited or restricted   Alm Bustard 12/18/2012, 9:44 AM

## 2012-12-18 NOTE — Progress Notes (Signed)
PT Cancellation Note  Patient Details Name: Seth Schwartz MRN: [id number] DOB: November 29, 1935   Cancelled Treatment:    Reason Eval/Treat Not Completed: Other (comment) (Pt independent with mobility.  No PT needed.)   Lennell Shanks 12/18/2012, 10:11 AM

## 2012-12-23 ENCOUNTER — Encounter (HOSPITAL_COMMUNITY): Payer: Self-pay | Admitting: Neurological Surgery

## 2013-03-19 ENCOUNTER — Other Ambulatory Visit: Payer: Self-pay | Admitting: Neurological Surgery

## 2013-03-19 DIAGNOSIS — M960 Pseudarthrosis after fusion or arthrodesis: Secondary | ICD-10-CM

## 2013-03-20 ENCOUNTER — Ambulatory Visit
Admission: RE | Admit: 2013-03-20 | Discharge: 2013-03-20 | Disposition: A | Discharge status: Home / Self Care | Payer: Medicare Other | Source: Ambulatory Visit | Attending: Neurological Surgery | Admitting: Neurological Surgery

## 2013-03-20 DIAGNOSIS — M960 Pseudarthrosis after fusion or arthrodesis: Secondary | ICD-10-CM

## 2013-03-25 ENCOUNTER — Other Ambulatory Visit: Payer: Self-pay | Admitting: Neurological Surgery

## 2013-03-25 ENCOUNTER — Encounter (HOSPITAL_COMMUNITY): Payer: Self-pay | Admitting: Pharmacy Technician

## 2013-03-31 ENCOUNTER — Encounter (HOSPITAL_COMMUNITY)
Admission: RE | Admit: 2013-03-31 | Discharge: 2013-03-31 | Disposition: A | Discharge status: Home / Self Care | Payer: Medicare Other | Source: Ambulatory Visit | Attending: Neurological Surgery | Admitting: Neurological Surgery

## 2013-03-31 ENCOUNTER — Encounter (HOSPITAL_COMMUNITY): Payer: Self-pay

## 2013-03-31 HISTORY — DX: Urgency of urination: R39.15

## 2013-03-31 HISTORY — DX: Unspecified glaucoma: H40.9

## 2013-03-31 HISTORY — DX: Low back pain: M54.5

## 2013-03-31 HISTORY — DX: Nocturia: R35.1

## 2013-03-31 HISTORY — DX: Personal history of other diseases of the digestive system: Z87.19

## 2013-03-31 HISTORY — DX: Benign prostatic hyperplasia without lower urinary tract symptoms: N40.0

## 2013-03-31 HISTORY — DX: Hyperlipidemia, unspecified: E78.5

## 2013-03-31 HISTORY — DX: Personal history of urinary calculi: Z87.442

## 2013-03-31 HISTORY — DX: Personal history of colonic polyps: Z86.010

## 2013-03-31 HISTORY — DX: Other chronic pain: G89.29

## 2013-03-31 HISTORY — DX: Frequency of micturition: R35.0

## 2013-03-31 HISTORY — DX: Other melanin hyperpigmentation: L81.4

## 2013-03-31 HISTORY — DX: Pneumonia, unspecified organism: J18.9

## 2013-03-31 HISTORY — DX: Low back pain, unspecified: M54.50

## 2013-03-31 HISTORY — DX: Insomnia, unspecified: G47.00

## 2013-03-31 HISTORY — DX: Weakness: R53.1

## 2013-03-31 HISTORY — DX: Personal history of colon polyps, unspecified: Z86.0100

## 2013-03-31 LAB — BASIC METABOLIC PANEL
BUN: 15 mg/dL (ref 6–23)
CO2: 27 mEq/L (ref 19–32)
Calcium: 10.1 mg/dL (ref 8.4–10.5)
Chloride: 106 mEq/L (ref 96–112)
Creatinine, Ser: 1.07 mg/dL (ref 0.50–1.35)
GFR calc Af Amer: 76 mL/min — ABNORMAL LOW (ref >90)
GFR calc non Af Amer: 66 mL/min — ABNORMAL LOW (ref >90)
Glucose, Bld: 128 mg/dL — ABNORMAL HIGH (ref 70–99)
Potassium: 4.1 mEq/L (ref 3.5–5.1)
Sodium: 139 mEq/L (ref 135–145)

## 2013-03-31 LAB — CBC
HCT: 47.7 % (ref 39.0–52.0)
Hemoglobin: 16.8 g/dL (ref 13.0–17.0)
MCH: 30.4 pg (ref 26.0–34.0)
MCHC: 35.2 g/dL (ref 30.0–36.0)
MCV: 86.4 fL (ref 78.0–100.0)
Platelets: 155 10*3/uL (ref 150–400)
RBC: 5.52 MIL/uL (ref 4.22–5.81)
RDW: 13.8 % (ref 11.5–15.5)
WBC: 5 10*3/uL (ref 4.0–10.5)

## 2013-03-31 LAB — TYPE AND SCREEN
ABO/RH(D): O POS
Antibody Screen: NEGATIVE

## 2013-03-31 LAB — SURGICAL PCR SCREEN
MRSA, PCR: NEGATIVE
Staphylococcus aureus: POSITIVE — AB

## 2013-03-31 MED ORDER — VANCOMYCIN HCL IN DEXTROSE 1-5 GM/200ML-% IV SOLN
1000.0000 mg | INTRAVENOUS | Status: AC
  Administered 2013-04-01: 1000 mg via INTRAVENOUS
  Filled 2013-03-31: qty 200

## 2013-03-31 NOTE — Pre-Procedure Instructions (Signed)
Seth Schwartz  03/31/2013   Your procedure is scheduled on:  Tues, June 24 @ 2:56 PM  Report to [facility] at 12:00 PM.  Call this number if you have problems the morning of surgery: [phone]   Remember:   Do not eat food or drink liquids after midnight.   Take these medicines the morning of surgery with A SIP OF WATER: Diazepam(Valium),Omprazole(Prilosec),Eye Drops,Tramadol(Ultram-if needed),Luvox(Fluvoxamine),and Pain Pill(if needed)               Stop taking your Aspirin and Fish Oil.No BC's,Goody's,Ibuprofen,Aleve,or any Herbal Medications     Do not wear jewelry  Do not wear lotions, powders, or colognes. You may wear deodorant.  Men may shave face and neck.  Do not bring valuables to the hospital.  [facility] is not responsible                   for any belongings or valuables.  Contacts, dentures or bridgework may not be worn into surgery.  Leave suitcase in the car. After surgery it may be brought to your room.  For patients admitted to the hospital, checkout time is 11:00 AM the day of  discharge.       Special Instructions: Shower using CHG 2 nights before surgery and the night before surgery.  If you shower the day of surgery use CHG.  Use special wash - you have one bottle of CHG for all showers.  You should use approximately 1/3 of the bottle for each shower.   Please read over the following fact sheets that you were given: Pain Booklet, Coughing and Deep Breathing, Blood Transfusion Information, MRSA Information and Surgical Site Infection Prevention

## 2013-03-31 NOTE — Progress Notes (Signed)
Pt doesn't have a cardiologist  Stress test done at least 36yrs ago as part of routine physical  Denies ever having an echo or heart cath  EKG and CXR in epic from 06-12-12  Dr>kevin Little is Medical MD

## 2013-04-01 ENCOUNTER — Encounter (HOSPITAL_COMMUNITY): Payer: Self-pay | Admitting: Anesthesiology

## 2013-04-01 ENCOUNTER — Ambulatory Visit (HOSPITAL_COMMUNITY): Payer: Medicare Other

## 2013-04-01 ENCOUNTER — Ambulatory Visit (HOSPITAL_COMMUNITY): Payer: Medicare Other | Admitting: Anesthesiology

## 2013-04-01 ENCOUNTER — Encounter (HOSPITAL_COMMUNITY)
Admission: RE | Disposition: A | Discharge status: Home / Self Care | Payer: Self-pay | Source: Ambulatory Visit | Attending: Neurological Surgery

## 2013-04-01 ENCOUNTER — Observation Stay (HOSPITAL_COMMUNITY)
Admission: RE | Admit: 2013-04-01 | Discharge: 2013-04-03 | Disposition: A | Discharge status: Home / Self Care | Payer: Medicare Other | Source: Ambulatory Visit | Attending: Neurological Surgery | Admitting: Neurological Surgery

## 2013-04-01 DIAGNOSIS — Z79899 Other long term (current) drug therapy: Secondary | ICD-10-CM | POA: Insufficient documentation

## 2013-04-01 DIAGNOSIS — I10 Essential (primary) hypertension: Secondary | ICD-10-CM | POA: Insufficient documentation

## 2013-04-01 DIAGNOSIS — S32009K Unspecified fracture of unspecified lumbar vertebra, subsequent encounter for fracture with nonunion: Secondary | ICD-10-CM

## 2013-04-01 DIAGNOSIS — T84498A Other mechanical complication of other internal orthopedic devices, implants and grafts, initial encounter: Principal | ICD-10-CM | POA: Insufficient documentation

## 2013-04-01 DIAGNOSIS — Z981 Arthrodesis status: Secondary | ICD-10-CM | POA: Insufficient documentation

## 2013-04-01 DIAGNOSIS — R3915 Urgency of urination: Secondary | ICD-10-CM | POA: Insufficient documentation

## 2013-04-01 DIAGNOSIS — Z01812 Encounter for preprocedural laboratory examination: Secondary | ICD-10-CM | POA: Insufficient documentation

## 2013-04-01 DIAGNOSIS — Y832 Surgical operation with anastomosis, bypass or graft as the cause of abnormal reaction of the patient, or of later complication, without mention of misadventure at the time of the procedure: Secondary | ICD-10-CM | POA: Insufficient documentation

## 2013-04-01 HISTORY — PX: ANTERIOR LUMBAR FUSION: SHX1170

## 2013-04-01 SURGERY — ANTERIOR LUMBAR FUSION 1 LEVEL
Anesthesia: General | Site: Abdomen | Wound class: Clean

## 2013-04-01 MED ORDER — VECURONIUM BROMIDE 10 MG IV SOLR
INTRAVENOUS | Status: DC | PRN
  Administered 2013-04-01 (×6): 1 mg via INTRAVENOUS
  Administered 2013-04-01: 2 mg via INTRAVENOUS
  Administered 2013-04-01: 1 mg via INTRAVENOUS

## 2013-04-01 MED ORDER — BACITRACIN 50000 UNITS IM SOLR
INTRAMUSCULAR | Status: AC
  Filled 2013-04-01: qty 1

## 2013-04-01 MED ORDER — METHOCARBAMOL 500 MG PO TABS
500.0000 mg | ORAL_TABLET | Freq: Four times a day (QID) | ORAL | Status: DC | PRN
  Administered 2013-04-01 – 2013-04-02 (×2): 500 mg via ORAL
  Filled 2013-04-01: qty 1

## 2013-04-01 MED ORDER — DIAZEPAM 5 MG PO TABS
5.0000 mg | ORAL_TABLET | Freq: Four times a day (QID) | ORAL | Status: DC | PRN
  Administered 2013-04-01 – 2013-04-02 (×2): 5 mg via ORAL
  Filled 2013-04-01 (×2): qty 1

## 2013-04-01 MED ORDER — OXYCODONE HCL 5 MG PO TABS
5.0000 mg | ORAL_TABLET | Freq: Once | ORAL | Status: AC | PRN
  Administered 2013-04-01: 5 mg via ORAL

## 2013-04-01 MED ORDER — GLYCOPYRROLATE 0.2 MG/ML IJ SOLN
INTRAMUSCULAR | Status: DC | PRN
  Administered 2013-04-01: 0.6 mg via INTRAVENOUS
  Administered 2013-04-01: 0.2 mg via INTRAVENOUS

## 2013-04-01 MED ORDER — FENTANYL CITRATE 0.05 MG/ML IJ SOLN
INTRAMUSCULAR | Status: DC | PRN
  Administered 2013-04-01: 50 ug via INTRAVENOUS
  Administered 2013-04-01: 100 ug via INTRAVENOUS
  Administered 2013-04-01 (×3): 50 ug via INTRAVENOUS
  Administered 2013-04-01: 100 ug via INTRAVENOUS
  Administered 2013-04-01: 50 ug via INTRAVENOUS

## 2013-04-01 MED ORDER — TRAZODONE HCL 100 MG PO TABS
100.0000 mg | ORAL_TABLET | Freq: Every day | ORAL | Status: DC
  Administered 2013-04-01 – 2013-04-02 (×2): 100 mg via ORAL
  Filled 2013-04-01 (×3): qty 1

## 2013-04-01 MED ORDER — 0.9 % SODIUM CHLORIDE (POUR BTL) OPTIME
TOPICAL | Status: DC | PRN
  Administered 2013-04-01: 1000 mL

## 2013-04-01 MED ORDER — MORPHINE SULFATE 2 MG/ML IJ SOLN: 1.0000 mg | INTRAMUSCULAR | Status: DC | PRN

## 2013-04-01 MED ORDER — PANTOPRAZOLE SODIUM 40 MG PO TBEC
40.0000 mg | DELAYED_RELEASE_TABLET | Freq: Every day | ORAL | Status: DC
  Administered 2013-04-02 – 2013-04-03 (×2): 40 mg via ORAL
  Filled 2013-04-01 (×2): qty 1

## 2013-04-01 MED ORDER — PHENYLEPHRINE HCL 10 MG/ML IJ SOLN
INTRAMUSCULAR | Status: DC | PRN
  Administered 2013-04-01: 120 ug via INTRAVENOUS
  Administered 2013-04-01 (×2): 80 ug via INTRAVENOUS

## 2013-04-01 MED ORDER — OXYCODONE HCL 5 MG PO TABS
5.0000 mg | ORAL_TABLET | Freq: Three times a day (TID) | ORAL | Status: DC | PRN
  Administered 2013-04-02 – 2013-04-03 (×3): 5 mg via ORAL
  Filled 2013-04-01 (×3): qty 1

## 2013-04-01 MED ORDER — HYDROMORPHONE HCL PF 1 MG/ML IJ SOLN
INTRAMUSCULAR | Status: AC
  Filled 2013-04-01: qty 1

## 2013-04-01 MED ORDER — OXYCODONE-ACETAMINOPHEN 5-325 MG PO TABS
ORAL_TABLET | ORAL | Status: AC
  Filled 2013-04-01: qty 1

## 2013-04-01 MED ORDER — SODIUM CHLORIDE 0.9 % IV SOLN
INTRAVENOUS | Status: DC
  Administered 2013-04-01: 22:00:00 via INTRAVENOUS

## 2013-04-01 MED ORDER — METHOCARBAMOL 500 MG PO TABS
ORAL_TABLET | ORAL | Status: AC
  Filled 2013-04-01: qty 1

## 2013-04-01 MED ORDER — OXYCODONE-ACETAMINOPHEN 5-325 MG PO TABS
1.0000 | ORAL_TABLET | Freq: Three times a day (TID) | ORAL | Status: DC
  Administered 2013-04-02 – 2013-04-03 (×5): 1 via ORAL
  Filled 2013-04-01 (×6): qty 1

## 2013-04-01 MED ORDER — LACTATED RINGERS IV SOLN
INTRAVENOUS | Status: DC | PRN
  Administered 2013-04-01 (×3): via INTRAVENOUS

## 2013-04-01 MED ORDER — OXYCODONE HCL 5 MG PO TABS
ORAL_TABLET | ORAL | Status: AC
  Filled 2013-04-01: qty 1

## 2013-04-01 MED ORDER — ROCURONIUM BROMIDE 100 MG/10ML IV SOLN
INTRAVENOUS | Status: DC | PRN
  Administered 2013-04-01: 50 mg via INTRAVENOUS

## 2013-04-01 MED ORDER — DOCUSATE SODIUM 100 MG PO CAPS
100.0000 mg | ORAL_CAPSULE | Freq: Two times a day (BID) | ORAL | Status: DC
  Administered 2013-04-01 – 2013-04-03 (×4): 100 mg via ORAL
  Filled 2013-04-01 (×4): qty 1

## 2013-04-01 MED ORDER — METHOCARBAMOL 100 MG/ML IJ SOLN
500.0000 mg | Freq: Four times a day (QID) | INTRAVENOUS | Status: DC | PRN
  Filled 2013-04-01: qty 5

## 2013-04-01 MED ORDER — SODIUM CHLORIDE 0.9 % IV SOLN: 250.0000 mL | INTRAVENOUS | Status: DC

## 2013-04-01 MED ORDER — PHENYLEPHRINE HCL 10 MG/ML IJ SOLN
10.0000 mg | INTRAVENOUS | Status: DC | PRN
  Administered 2013-04-01: 20 ug/min via INTRAVENOUS

## 2013-04-01 MED ORDER — PROPOFOL 10 MG/ML IV BOLUS
INTRAVENOUS | Status: DC | PRN
  Administered 2013-04-01: 150 mg via INTRAVENOUS

## 2013-04-01 MED ORDER — ONDANSETRON HCL 4 MG/2ML IJ SOLN
INTRAMUSCULAR | Status: DC | PRN
  Administered 2013-04-01: 4 mg via INTRAVENOUS

## 2013-04-01 MED ORDER — ONDANSETRON HCL 4 MG/2ML IJ SOLN: 4.0000 mg | Freq: Four times a day (QID) | INTRAMUSCULAR | Status: DC | PRN

## 2013-04-01 MED ORDER — ONDANSETRON HCL 4 MG/2ML IJ SOLN: 4.0000 mg | INTRAMUSCULAR | Status: DC | PRN

## 2013-04-01 MED ORDER — ALUM & MAG HYDROXIDE-SIMETH 200-200-20 MG/5ML PO SUSP: 30.0000 mL | Freq: Four times a day (QID) | ORAL | Status: DC | PRN

## 2013-04-01 MED ORDER — OXYCODONE-ACETAMINOPHEN 10-325 MG PO TABS
1.0000 | ORAL_TABLET | Freq: Three times a day (TID) | ORAL | Status: DC | PRN
  Administered 2013-04-01: 1 via ORAL

## 2013-04-01 MED ORDER — HYDROMORPHONE HCL PF 1 MG/ML IJ SOLN
0.2500 mg | INTRAMUSCULAR | Status: DC | PRN
  Administered 2013-04-01 (×4): 0.5 mg via INTRAVENOUS

## 2013-04-01 MED ORDER — ALFUZOSIN HCL ER 10 MG PO TB24
10.0000 mg | ORAL_TABLET | Freq: Every evening | ORAL | Status: DC
  Administered 2013-04-01 – 2013-04-02 (×2): 10 mg via ORAL
  Filled 2013-04-01 (×3): qty 1

## 2013-04-01 MED ORDER — OXYCODONE HCL 5 MG/5ML PO SOLN: 5.0000 mg | Freq: Once | ORAL | Status: AC | PRN

## 2013-04-01 MED ORDER — SODIUM CHLORIDE 0.9 % IR SOLN
Status: DC | PRN
  Administered 2013-04-01: 18:00:00

## 2013-04-01 MED ORDER — LIDOCAINE-EPINEPHRINE 1 %-1:100000 IJ SOLN
INTRAMUSCULAR | Status: DC | PRN
  Administered 2013-04-01: 3 mL

## 2013-04-01 MED ORDER — LIDOCAINE HCL (CARDIAC) 20 MG/ML IV SOLN
INTRAVENOUS | Status: DC | PRN
  Administered 2013-04-01: 70 mg via INTRAVENOUS

## 2013-04-01 MED ORDER — SODIUM CHLORIDE 0.9 % IV SOLN
INTRAVENOUS | Status: AC
  Filled 2013-04-01: qty 500

## 2013-04-01 MED ORDER — NEOSTIGMINE METHYLSULFATE 1 MG/ML IJ SOLN
INTRAMUSCULAR | Status: DC | PRN
  Administered 2013-04-01: 4 mg via INTRAVENOUS

## 2013-04-01 MED ORDER — ACETAMINOPHEN 325 MG PO TABS: 650.0000 mg | ORAL_TABLET | ORAL | Status: DC | PRN

## 2013-04-01 MED ORDER — THROMBIN 20000 UNITS EX SOLR
CUTANEOUS | Status: DC | PRN
  Administered 2013-04-01: 18:00:00 via TOPICAL

## 2013-04-01 MED ORDER — LACTATED RINGERS IV SOLN
INTRAVENOUS | Status: DC | PRN
  Administered 2013-04-01: 17:00:00 via INTRAVENOUS

## 2013-04-01 MED ORDER — FLUVOXAMINE MALEATE 50 MG PO TABS
50.0000 mg | ORAL_TABLET | Freq: Every morning | ORAL | Status: DC
  Administered 2013-04-02 – 2013-04-03 (×2): 50 mg via ORAL
  Filled 2013-04-01 (×2): qty 1

## 2013-04-01 MED ORDER — BUPIVACAINE HCL (PF) 0.25 % IJ SOLN
INTRAMUSCULAR | Status: DC | PRN
  Administered 2013-04-01: 3 mL

## 2013-04-01 MED ORDER — SODIUM CHLORIDE 0.9 % IJ SOLN: 3.0000 mL | INTRAMUSCULAR | Status: DC | PRN

## 2013-04-01 MED ORDER — LOSARTAN POTASSIUM 50 MG PO TABS
50.0000 mg | ORAL_TABLET | Freq: Every day | ORAL | Status: DC
  Administered 2013-04-02 – 2013-04-03 (×2): 50 mg via ORAL
  Filled 2013-04-01 (×2): qty 1

## 2013-04-01 MED ORDER — SODIUM CHLORIDE 0.9 % IV SOLN
INTRAVENOUS | Status: DC | PRN
  Administered 2013-04-01: 20:00:00 via INTRAVENOUS

## 2013-04-01 MED ORDER — MENTHOL 3 MG MT LOZG: 1.0000 | LOZENGE | OROMUCOSAL | Status: DC | PRN

## 2013-04-01 MED ORDER — SODIUM CHLORIDE 0.9 % IJ SOLN
3.0000 mL | Freq: Two times a day (BID) | INTRAMUSCULAR | Status: DC
  Administered 2013-04-02 – 2013-04-03 (×3): 3 mL via INTRAVENOUS

## 2013-04-01 MED ORDER — ENOXAPARIN SODIUM 40 MG/0.4ML ~~LOC~~ SOLN
40.0000 mg | SUBCUTANEOUS | Status: DC
  Administered 2013-04-02 – 2013-04-03 (×2): 40 mg via SUBCUTANEOUS
  Filled 2013-04-01 (×3): qty 0.4

## 2013-04-01 MED ORDER — TIMOLOL MALEATE 0.5 % OP SOLN
1.0000 [drp] | Freq: Two times a day (BID) | OPHTHALMIC | Status: DC
  Administered 2013-04-02 – 2013-04-03 (×2): 1 [drp] via OPHTHALMIC
  Filled 2013-04-01: qty 5

## 2013-04-01 MED ORDER — CYCLOBENZAPRINE HCL 10 MG PO TABS: 10.0000 mg | ORAL_TABLET | Freq: Three times a day (TID) | ORAL | Status: DC | PRN

## 2013-04-01 MED ORDER — TIMOLOL HEMIHYDRATE 0.5 % OP SOLN: 1.0000 [drp] | Freq: Two times a day (BID) | OPHTHALMIC | Status: DC

## 2013-04-01 MED ORDER — ACETAMINOPHEN 650 MG RE SUPP: 650.0000 mg | RECTAL | Status: DC | PRN

## 2013-04-01 MED ORDER — PHENOL 1.4 % MT LIQD: 1.0000 | OROMUCOSAL | Status: DC | PRN

## 2013-04-01 MED ORDER — MIDAZOLAM HCL 5 MG/5ML IJ SOLN
INTRAMUSCULAR | Status: DC | PRN
  Administered 2013-04-01 (×2): 1 mg via INTRAVENOUS

## 2013-04-01 MED ORDER — ARTIFICIAL TEARS OP OINT
TOPICAL_OINTMENT | OPHTHALMIC | Status: DC | PRN
  Administered 2013-04-01: 1 via OPHTHALMIC

## 2013-04-01 SURGICAL SUPPLY — 77 items
ADH SKN CLS APL DERMABOND .7 (GAUZE/BANDAGES/DRESSINGS) ×1
ADH SKN CLS LQ APL DERMABOND (GAUZE/BANDAGES/DRESSINGS) ×1
APPLIER CLIP 11 MED OPEN (CLIP) ×4
APR CLP MED 11 20 MLT OPN (CLIP) ×2
BAG DECANTER FOR FLEXI CONT (MISCELLANEOUS) ×2 IMPLANT
BUR BARREL STRAIGHT FLUTE 4.0 (BURR) ×2 IMPLANT
BUR MATCHSTICK NEURO 3.0 LAGG (BURR) IMPLANT
CANISTER SUCTION 2500CC (MISCELLANEOUS) ×2 IMPLANT
CLIP APPLIE 11 MED OPEN (CLIP) ×1 IMPLANT
CLOTH BEACON ORANGE TIMEOUT ST (SAFETY) ×2 IMPLANT
CONT SPEC 4OZ CLIKSEAL STRL BL (MISCELLANEOUS) ×2 IMPLANT
CORDS BIPOLAR (ELECTRODE) ×2 IMPLANT
COVER BACK TABLE 24X17X13 BIG (DRAPES) IMPLANT
COVER TABLE BACK 60X90 (DRAPES) ×2 IMPLANT
DECANTER SPIKE VIAL GLASS SM (MISCELLANEOUS) ×2 IMPLANT
DERMABOND ADHESIVE PROPEN (GAUZE/BANDAGES/DRESSINGS) ×1
DERMABOND ADVANCED (GAUZE/BANDAGES/DRESSINGS) ×1
DERMABOND ADVANCED .7 DNX12 (GAUZE/BANDAGES/DRESSINGS) ×1 IMPLANT
DERMABOND ADVANCED .7 DNX6 (GAUZE/BANDAGES/DRESSINGS) IMPLANT
DRAPE C-ARM 42X72 X-RAY (DRAPES) ×6 IMPLANT
DRAPE INCISE IOBAN 66X45 STRL (DRAPES) ×1 IMPLANT
DRAPE LAPAROTOMY 100X72X124 (DRAPES) ×3 IMPLANT
DRAPE POUCH INSTRU U-SHP 10X18 (DRAPES) ×2 IMPLANT
DURAPREP 26ML APPLICATOR (WOUND CARE) ×2 IMPLANT
ELECT BLADE 4.0 EZ CLEAN MEGAD (MISCELLANEOUS) ×2
ELECT REM PT RETURN 9FT ADLT (ELECTROSURGICAL) ×2
ELECTRODE BLDE 4.0 EZ CLN MEGD (MISCELLANEOUS) ×1 IMPLANT
ELECTRODE REM PT RTRN 9FT ADLT (ELECTROSURGICAL) ×1 IMPLANT
GAUZE SPONGE 4X4 16PLY XRAY LF (GAUZE/BANDAGES/DRESSINGS) IMPLANT
GLOVE BIO SURGEON STRL SZ7 (GLOVE) ×1 IMPLANT
GLOVE BIO SURGEON STRL SZ7.5 (GLOVE) IMPLANT
GLOVE BIOGEL PI IND STRL 7.5 (GLOVE) IMPLANT
GLOVE BIOGEL PI IND STRL 8.5 (GLOVE) IMPLANT
GLOVE BIOGEL PI INDICATOR 7.5 (GLOVE) ×1
GLOVE BIOGEL PI INDICATOR 8.5 (GLOVE) ×1
GLOVE ECLIPSE 8.5 STRL (GLOVE) ×2 IMPLANT
GLOVE EXAM NITRILE LRG STRL (GLOVE) IMPLANT
GLOVE EXAM NITRILE MD LF STRL (GLOVE) IMPLANT
GLOVE EXAM NITRILE XL STR (GLOVE) ×1 IMPLANT
GLOVE EXAM NITRILE XS STR PU (GLOVE) IMPLANT
GLOVE OPTIFIT SS 7.5 STRL LX (GLOVE) ×2 IMPLANT
GLOVE SS N UNI LF 7.5 STRL (GLOVE) IMPLANT
GOWN BRE IMP SLV AUR LG STRL (GOWN DISPOSABLE) ×3 IMPLANT
GOWN BRE IMP SLV AUR XL STRL (GOWN DISPOSABLE) ×4 IMPLANT
GOWN STRL REIN 2XL LVL4 (GOWN DISPOSABLE) ×3 IMPLANT
IMPLANT MEDIAN 6DEG 27X30X10 (Orthopedic Implant) ×1 IMPLANT
KIT BASIN OR (CUSTOM PROCEDURE TRAY) ×2 IMPLANT
KIT INFUSE XX SMALL 0.7CC (Orthopedic Implant) ×1 IMPLANT
KIT ROOM TURNOVER OR (KITS) ×2 IMPLANT
MARKER SKIN DUAL TIP RULER LAB (MISCELLANEOUS) ×1 IMPLANT
NDL HYPO 25X1 1.5 SAFETY (NEEDLE) ×1 IMPLANT
NDL SPNL 18GX3.5 QUINCKE PK (NEEDLE) IMPLANT
NEEDLE HYPO 25X1 1.5 SAFETY (NEEDLE) ×2 IMPLANT
NEEDLE SPNL 18GX3.5 QUINCKE PK (NEEDLE) IMPLANT
NS IRRIG 1000ML POUR BTL (IV SOLUTION) ×2 IMPLANT
PACK FOAM VITOSS 5CC (Orthopedic Implant) ×1 IMPLANT
PACK LAMINECTOMY NEURO (CUSTOM PROCEDURE TRAY) ×2 IMPLANT
PLATE ANCHORING SMALL (Plate) ×1 IMPLANT
SPONGE INTESTINAL PEANUT (DISPOSABLE) ×6 IMPLANT
SPONGE LAP 18X18 X RAY DECT (DISPOSABLE) ×2 IMPLANT
SPONGE SURGIFOAM ABS GEL 100 (HEMOSTASIS) ×2 IMPLANT
SUT MNCRL AB 4-0 PS2 18 (SUTURE) ×1 IMPLANT
SUT SILK 2 0 TIES 10X30 (SUTURE) ×2 IMPLANT
SUT VIC AB 0 CT1 27 (SUTURE) ×4
SUT VIC AB 0 CT1 27XBRD ANBCTR (SUTURE) ×2 IMPLANT
SUT VIC AB 1 CT1 18XBRD ANBCTR (SUTURE) ×1 IMPLANT
SUT VIC AB 1 CT1 8-18 (SUTURE) ×2
SUT VIC AB 2-0 CP2 18 (SUTURE) ×2 IMPLANT
SUT VIC AB 3-0 SH 8-18 (SUTURE) ×3 IMPLANT
SUT VICRYL 4-0 PS2 18IN ABS (SUTURE) IMPLANT
SYR 20ML ECCENTRIC (SYRINGE) ×2 IMPLANT
SYR CONTROL 10ML LL (SYRINGE) ×2 IMPLANT
TOWEL OR 17X24 6PK STRL BLUE (TOWEL DISPOSABLE) ×6 IMPLANT
TOWEL OR 17X26 10 PK STRL BLUE (TOWEL DISPOSABLE) ×2 IMPLANT
TRAP SPECIMEN MUCOUS 40CC (MISCELLANEOUS) IMPLANT
TRAY FOLEY CATH 14FRSI W/METER (CATHETERS) ×2 IMPLANT
WATER STERILE IRR 1000ML POUR (IV SOLUTION) ×2 IMPLANT

## 2013-04-01 NOTE — Anesthesia Procedure Notes (Signed)
Procedure Name: Intubation Date/Time: 04/01/2013 4:28 PM Performed by: Gayla Medicus Pre-anesthesia Checklist: Patient identified, Timeout performed, Emergency Drugs available, Suction available and Patient being monitored Patient Re-evaluated:Patient Re-evaluated prior to inductionOxygen Delivery Method: Circle system utilized Preoxygenation: Pre-oxygenation with 100% oxygen Intubation Type: IV induction Ventilation: Mask ventilation without difficulty and Oral airway inserted - appropriate to patient size Laryngoscope Size: Mac and 3 Grade View: Grade I Tube type: Oral Tube size: 7.5 mm Number of attempts: 1 Airway Equipment and Method: Stylet and Video-laryngoscopy Placement Confirmation: ETT inserted through vocal cords under direct vision,  positive ETCO2 and breath sounds checked- equal and bilateral Secured at: 23 cm Tube secured with: Tape Dental Injury: Teeth and Oropharynx as per pre-operative assessment

## 2013-04-01 NOTE — H&P (Signed)
Seth Schwartz is an 77 y.o. male.   Chief Complaint: Back pain HPI: Seth Schwartz is a 77 year old individual who has had a number of fusions in the past. In September of 2013 he underwent an arthrodesis at L5-S1 as the last unfused level in a long arthrodesis from L2-L5. Since that time it appears that he is developed a pseudoarthrosis at L5-S1. He has been having substantial amounts of pain in his back and into his lower extremities. After some consideration we discussed revision of his pseudoarthrosis via an anterior lumbar interbody arthrodesis. He is now admitted for this surgery.  Past Medical History  Diagnosis Date  . Arthritis   . Enlarged prostate     takes Uroxatral nightly  . Anxiety     takes Valium prn  . Depression     takes Luvox daily  . GERD (gastroesophageal reflux disease)     takes Omeprazole daily  . Hypertension     takes Losartan nightly  . Hyperlipidemia     takes Cholestoff at night and Fish Oil bid  . Pneumonia     as a baby  . Headache(784.0)     r/t cervical issues  . Weakness     left leg and both hands  . Chronic low back pain   . Skin spots-aging   . H/O hiatal hernia   . History of gastric ulcer 1967  . History of colon polyps   . Urinary frequency   . Urinary urgency   . History of kidney stones   . Nocturia   . Glaucoma     uses eye drops daily  . Insomnia     takes Trazodone nightly  . Constipation     takes Benefiber and stool softener daily    Past Surgical History  Procedure Laterality Date  . Hemorrhoid surgery  2009    x 2  . Trigger finger release Left 4-75yrs ago    middle finger  . Anterior cervical decomp/discectomy fusion N/A 12/17/2012    Procedure: ANTERIOR CERVICAL DECOMPRESSION/DISCECTOMY FUSION 2 LEVELS;  Surgeon: Barnett Abu, MD;  Location: [facility];  Service: Neurosurgery;  Laterality: N/A;  Cervical three-four Anterior cervical decompression/diskectomy/fusion, with anterior revision of Cervical six-seven  pseudoarthrosis  . Anterior cervical decomp/discectomy fusion  1998  . Back surgery  2000/2002/2007    3 lumbars, 1 cervical  with fusion  . Eye surgery Bilateral 2008    glaucoma  . Colonoscopy    . Esophagogastroduodenoscopy    . Lithotripsy      History reviewed. No pertinent family history. Social History:  reports that he has quit smoking. He does not have any smokeless tobacco history on file. He reports that he does not drink alcohol or use illicit drugs.  Allergies:  Allergies  Allergen Reactions  . Sulfa Antibiotics Shortness Of Breath  . Penicillins Hives    Medications Prior to Admission  Medication Sig Dispense Refill  . alfuzosin (UROXATRAL) 10 MG 24 hr tablet Take 10 mg by mouth every evening.      . Cholecalciferol (VITAMIN D) 2000 UNITS tablet Take 2,000 Units by mouth every evening.      . cyclobenzaprine (FLEXERIL) 10 MG tablet Take 10 mg by mouth 3 (three) times daily as needed for muscle spasms.      . diazepam (VALIUM) 5 MG tablet Take 5 mg by mouth every 6 (six) hours as needed for anxiety.      . docusate sodium (COLACE) 100 MG capsule Take  100 mg by mouth 2 (two) times daily.      . fluvoxaMINE (LUVOX) 50 MG tablet Take 50 mg by mouth every morning.      Marland Kitchen losartan (COZAAR) 50 MG tablet Take 50 mg by mouth daily.      . Multiple Vitamin (MULTIVITAMIN WITH MINERALS) TABS Take 1 tablet by mouth daily.      . Omega-3 Fatty Acids (FISH OIL PO) Take by mouth.      Marland Kitchen omeprazole (PRILOSEC) 20 MG capsule Take 20 mg by mouth 2 (two) times daily.      Marland Kitchen OVER THE COUNTER MEDICATION Take 900 mg by mouth daily. Cholestoff      . oxyCODONE-acetaminophen (PERCOCET) 10-325 MG per tablet Take 1 tablet by mouth 3 (three) times daily as needed for pain.      Marland Kitchen testosterone cypionate (DEPOTESTOTERONE CYPIONATE) 100 MG/ML injection Inject 50 mg into the muscle See admin instructions. For IM use only. Take every 10 days      . timolol (BETIMOL) 0.5 % ophthalmic solution Place 1  drop into both eyes 2 (two) times daily.      . traZODone (DESYREL) 50 MG tablet Take 100 mg by mouth at bedtime.      . vitamin B-12 (CYANOCOBALAMIN) 1000 MCG tablet Take 1,000 mcg by mouth daily.      . Wheat Dextrin (BENEFIBER) POWD Take 10 mLs by mouth 2 (two) times daily.      Marland Kitchen aspirin EC 81 MG tablet Take 81 mg by mouth daily. On hold for procedure.      . traMADol (ULTRAM) 50 MG tablet Take 50 mg by mouth every 6 (six) hours as needed for pain.        Results for orders placed during the hospital encounter of 03/31/13 (from the past 48 hour(s))  SURGICAL PCR SCREEN     Status: Abnormal   Collection Time    03/31/13 10:31 AM      Result Value Range   MRSA, PCR NEGATIVE  NEGATIVE   Staphylococcus aureus POSITIVE (*) NEGATIVE   Comment:            The Xpert SA Assay (FDA     approved for NASAL specimens     in patients over 58 years of age),     is one component of     a comprehensive surveillance     program.  Test performance has     been validated by [employer] for patients greater     than or equal to 35 year old.     It is not intended     to diagnose infection nor to     guide or monitor treatment.  BASIC METABOLIC PANEL     Status: Abnormal   Collection Time    03/31/13 10:40 AM      Result Value Range   Sodium 139  135 - 145 mEq/L   Potassium 4.1  3.5 - 5.1 mEq/L   Chloride 106  96 - 112 mEq/L   CO2 27  19 - 32 mEq/L   Glucose, Bld 128 (*) 70 - 99 mg/dL   BUN 15  6 - 23 mg/dL   Creatinine, Ser 1.61  0.50 - 1.35 mg/dL   Calcium 09.6  8.4 - 04.5 mg/dL   GFR calc non Af Amer 66 (*) >90 mL/min   GFR calc Af Amer 76 (*) >90 mL/min   Comment:  The eGFR has been calculated     using the CKD EPI equation.     This calculation has not been     validated in all clinical     situations.     eGFR's persistently     <90 mL/min signify     possible Chronic Kidney Disease.  CBC     Status: None   Collection Time    03/31/13 10:40 AM      Result Value  Range   WBC 5.0  4.0 - 10.5 K/uL   RBC 5.52  4.22 - 5.81 MIL/uL   Hemoglobin 16.8  13.0 - 17.0 g/dL   HCT 78.2  95.6 - 21.3 %   MCV 86.4  78.0 - 100.0 fL   MCH 30.4  26.0 - 34.0 pg   MCHC 35.2  30.0 - 36.0 g/dL   RDW 08.6  57.8 - 46.9 %   Platelets 155  150 - 400 K/uL  TYPE AND SCREEN     Status: None   Collection Time    03/31/13 10:45 AM      Result Value Range   ABO/RH(D) O POS     Antibody Screen NEG     Sample Expiration 04/14/2013     No results found.  Review of Systems  Constitutional: Positive for malaise/fatigue.  HENT: Negative.   Eyes: Negative.   Respiratory: Negative.   Cardiovascular: Negative.   Genitourinary: Negative.   Musculoskeletal: Positive for back pain.  Skin: Negative.   Neurological: Positive for tingling, sensory change, focal weakness and weakness.  Endo/Heme/Allergies: Negative.   Psychiatric/Behavioral: Negative.     Blood pressure 122/75, pulse 72, temperature 98.1 F (36.7 C), temperature source Oral, resp. rate 20, SpO2 97.00%. Physical Exam  Constitutional: He is oriented to person, place, and time. He appears well-developed and well-nourished.  HENT:  Head: Normocephalic and atraumatic.  Eyes: Conjunctivae and EOM are normal. Pupils are equal, round, and reactive to light.  Neck: Normal range of motion. Neck supple.  Cardiovascular: Normal rate and regular rhythm.   Respiratory: Effort normal and breath sounds normal.  GI: Soft. Bowel sounds are normal.  Musculoskeletal:  Decreased range of motion in neck to 50% of flexion extension and lateral rotation. Decreased flexion and extension bending maximally to 45 In the lumbar spine.  Neurological: He is alert and oriented to person, place, and time.  Absent peripheral reflexes in patella and Achilles 2+ reflexes in biceps 1+ and triceps. Motor strength is grossly intact in the upper and lower extremities.  Skin: Skin is warm and dry.  Psychiatric: He has a normal mood and affect. His  behavior is normal. Judgment and thought content normal.     Assessment/Plan Seth Schwartz has evidence of a pseudoarthrosis at L5-S1.  I demonstrated the findings to him from his recent CT scan.  There is some gas formation in the interbody space where he has PEEK spacers.  There is no solid bony formation throughout the interspace.  Furthermore, I note that there is some halo formation around the S1 screws particularly and this would confirm the presence of the pseudoarthrosis.  The foramen appeared to be adequately patent.  The other levels at L4-5, L3-4, L2-3 all appear to be healed solidly.  The alignment of the spine is quite anatomic in both the coronal and sagittal planes.   IMPRESSION/PLAN:  We discussed further treatment for the pseudoarthrosis and I indicated to Seth Schwartz that we continue to wait and watch.  Both he and Seth Schwartz, however, note that his pain is quite intolerable and his level of activity has diminished to being very minimal.  He notes that he walks only short distances, getting up in the mornings out of bed if it is particularly painful and he finds that he has to walk with a forward stoop in a flexed position in order to maintain some level of comfort.  Beyond that he has evidence and difficulty with some footdrop.  He has been using oxycodone 10, two and a half tabs to three tabs a day to maintain some level of comfort.  He is concerned about the amount of medication that he is using.  We discussed consideration of the use of OxyContin 10 mg twice a day and then using oxycodone for breakthrough to see if this gives him smoother more analgesic effect. We also discussed the potential of considering surgical intervention.  This would be best accomplished via an anterior approach to dissect into the disc space and remove the previously placed PEEK spacers and then place a larger single spacer along with some allograft with BMP product such as Infuse in an  effort to stimulate this area to heal.  This is indeed a significant surgical undertaking and I discussed with Mr. and Ms. Schwartz the risks of vascular injury either to the artery or to the vein in one or either leg that could occur.  These things not withstanding, I believe, the surgery is an appropriate consideration, however seeing that he is not neurologically compromised, it is not an absolute necessity.  The difficulty has been in controlling the degree of pain and allowing Seth Schwartz to be as active as he can be.  We will see how he does with the prescription for OxyContin 10 mg twice a day.  We can hold off on any decision regarding the surgery, but if the symptoms are substantial and worsening then certainly an anterior lumbar interbody arthrodesis at L5-S1 is an appropriate consideration. He is admitted for that surgery today.  Seth Schwartz J 04/01/2013, 4:07 PM

## 2013-04-01 NOTE — Anesthesia Preprocedure Evaluation (Addendum)
Anesthesia Evaluation  Patient identified by MRN, date of birth, ID band Patient awake    Reviewed: Allergy & Precautions, H&P , NPO status , Patient's Chart, lab work & pertinent test results  Airway Mallampati: II  Neck ROM: full    Dental   Pulmonary former smoker,          Cardiovascular hypertension, Pt. on medications     Neuro/Psych  Headaches, PSYCHIATRIC DISORDERS Anxiety Depression    GI/Hepatic hiatal hernia, GERD-  Medicated,  Endo/Other    Renal/GU      Musculoskeletal  (+) Arthritis -, Osteoarthritis,    Abdominal   Peds  Hematology   Anesthesia Other Findings   Reproductive/Obstetrics                          Anesthesia Physical Anesthesia Plan  ASA: III  Anesthesia Plan: General   Post-op Pain Management:    Induction: Intravenous  Airway Management Planned: Oral ETT  Additional Equipment:   Intra-op Plan:   Post-operative Plan: Extubation in OR  Informed Consent: I have reviewed the patients History and Physical, chart, labs and discussed the procedure including the risks, benefits and alternatives for the proposed anesthesia with the patient or authorized representative who has indicated his/her understanding and acceptance.   Dental advisory given  Plan Discussed with:   Anesthesia Plan Comments:        Anesthesia Quick Evaluation

## 2013-04-01 NOTE — Anesthesia Postprocedure Evaluation (Signed)
  Anesthesia Post-op Note  Patient: Seth Schwartz  Procedure(s) Performed: Procedure(s) with comments: ANTERIOR LUMBAR FUSION 1 LEVEL (N/A) - Lumbar five-Sacral One Anterior lumbar interbody fusion  Patient Location: PACU  Anesthesia Type:General  Level of Consciousness: awake  Airway and Oxygen Therapy: Patient Spontanous Breathing  Post-op Pain: mild  Post-op Assessment: Post-op Vital signs reviewed, Patient's Cardiovascular Status Stable, Respiratory Function Stable, Patent Airway, No signs of Nausea or vomiting and Pain level controlled  Post-op Vital Signs: stable  Complications: No apparent anesthesia complications

## 2013-04-01 NOTE — Op Note (Signed)
Date of surgery: 04/02/2013 Preoperative diagnosis: Pseudoarthrosis L5-S1 status post posterior lumbar interbody arthrodesis, lumbago Postoperative diagnosis: Pseudoarthrosis L5-S1 status post lumbar interbody arthrodesis, lumbago Procedure: Exploration of arthrodesis L5-S1 via anterior lumbar interbody approach removal of posterior lumbar interbody peek spacers, anterior lumbar interbody arthrodesis with LD R device Infuse, Vitoss allograft. Surgeon: Barnett Abu Assistant: Sharyon Cable Anesthesia: Gen. endotracheal Indications: [name] is a 77 year old individual who had a posterior lumbar interbody arthrodesis at L5-S1 in September of last year he had previous arthrodeses from L2-L5. He was not healing. He is having increasing pain and a CT scan suggested the presence of a pseudoarthrosis L5-S1. Despite conservative efforts pain continued to worsen. He is requiring narcotic analgesics in the form of Percocet on a daily basis. Is advised regarding surgical revision via an anterior lumbar interbody procedure.  Procedure: The patient was brought to the operating room placed on the table in the supine position. After the smooth induction of general endotracheal anesthesia, the abdomen was prepped with alcohol and DuraPrep and draped in a sterile fashion. A Foley catheter was placed. A lower abdominal incision was created on the left side from the midline to the region of the superior iliac crest after fluoroscopic guidance was used to localize the appropriate trajectory for the L5-S1 disc space. The superficial fascia was divided until the anterior rectus sheath was identified. Anterior sheath was opened. The rectus muscle was dissected free from the anterior rectus sheath. The lateral border was freed. The muscle was retracted medially. The dissection was clearly below the linea alba. The retroperitoneum was then entered by dissecting inferior to the linea alba using blunt finger dissection. As  the retroperitoneum was  identified a Data processing manager was placed in the wound to allow exposure of the retroperitoneal structures. The thin fascia overlying the iliac arteries at the sacral promontory was identified. This was divided. Then by dissecting between the vessels I was able to clear space to expose L5-S1. The dissection continued identifying 1 prominent presacral vein ligating and dividing it. The area was palpated and noted to be partially calcific off the left side. The right side was opened with a 15 blade. Some degenerated disc material was encountered. Then some grumous old graft was encountered. This clearly had not ossified. This was resected. The left-sided cage was identified. With some dissection it was loosened. However removal of the cage requires substantial dissection of the bone ventral to it to create a path to allow it to be grasped via the anterior approach. Ultimately we are able to remove it. The right-sided cage was then dissected from the far lateral aspect of the interspace. It to was stuck in place and required extensive dissection above and below the cage to free up path to allow its egress.  With the cages removed the disc space was then further clean. Dr. Gerlene Fee helped with the resection of a cages and exposure of the surgical site by dissecting providing retraction and suction. The endplates were then prepared using a large toothed currette, removing remnants of unfused allograft and some fibrous tissue that had grown into the disc space. The endplates were prepared with a high-speed drill removing any cartilaginous or none bony structure. In the end we sized 8x 27 x 30 mm spacer measuring 10 mm in height with 6 of lordosis. This was checked radiographically. The implant was then filled with the cost and an extra small dose of infuse. He was then inserted into the interspace under fluoroscopic guidance. When  the appropriate depth was found to small nails were inserted one  into L5 the other into the sacrum. The surgical site was inspected and the LDR device was noted to be well placed.  The surgical approach was then carefully inspected for hemostasis as retractors were removed. Final radiographs in the AP and lateral projection were obtain. Then the anterior rectus sheath was closed with a running 0 Vicryl suture. 2-0 Vicryl was used in the subcutaneous tissues, 3-0 Vicryl was used to close the subcuticular skin. Blood loss for the procedure was estimated at 500 cc. 180 cc of Cell Saver blood was returned.

## 2013-04-01 NOTE — Preoperative (Signed)
Beta Blockers   Reason not to administer Beta Blockers:Not Applicable 

## 2013-04-01 NOTE — Transfer of Care (Signed)
Immediate Anesthesia Transfer of Care Note  Patient: Seth Schwartz  Procedure(s) Performed: Procedure(s) with comments: ANTERIOR LUMBAR FUSION 1 LEVEL (N/A) - Lumbar five-Sacral One Anterior lumbar interbody fusion  Patient Location: PACU  Anesthesia Type:General  Level of Consciousness: awake, alert  and oriented  Airway & Oxygen Therapy: Patient Spontanous Breathing and Patient connected to nasal cannula oxygen  Post-op Assessment: Report given to PACU RN and Post -op Vital signs reviewed and stable  Post vital signs: Reviewed and stable  Complications: No apparent anesthesia complications

## 2013-04-02 ENCOUNTER — Encounter (HOSPITAL_COMMUNITY): Payer: Self-pay | Admitting: Neurological Surgery

## 2013-04-02 LAB — CBC
HCT: 40.7 % (ref 39.0–52.0)
Hemoglobin: 14 g/dL (ref 13.0–17.0)
MCH: 29.4 pg (ref 26.0–34.0)
MCHC: 34.4 g/dL (ref 30.0–36.0)
MCV: 85.5 fL (ref 78.0–100.0)
Platelets: 133 10*3/uL — ABNORMAL LOW (ref 150–400)
RBC: 4.76 MIL/uL (ref 4.22–5.81)
RDW: 13.7 % (ref 11.5–15.5)
WBC: 6.9 10*3/uL (ref 4.0–10.5)

## 2013-04-02 LAB — BASIC METABOLIC PANEL
BUN: 13 mg/dL (ref 6–23)
CO2: 26 mEq/L (ref 19–32)
Calcium: 8.7 mg/dL (ref 8.4–10.5)
Chloride: 105 mEq/L (ref 96–112)
Creatinine, Ser: 0.98 mg/dL (ref 0.50–1.35)
GFR calc Af Amer: >90 mL/min (ref >90)
GFR calc non Af Amer: 78 mL/min — ABNORMAL LOW (ref >90)
Glucose, Bld: 97 mg/dL (ref 70–99)
Potassium: 4.4 mEq/L (ref 3.5–5.1)
Sodium: 138 mEq/L (ref 135–145)

## 2013-04-02 MED FILL — Oxycodone w/ Acetaminophen Tab 5-325 MG: ORAL | Qty: 1 | Status: AC

## 2013-04-02 NOTE — Progress Notes (Signed)
Patient had an uneventful night. Chief complaint of burning sensation to surgical site and a dull aching pain in the lower mid back. PRNs given with relief and ice pack applied to site per patients request with some relief. Site WNL. Patient was able to sleep a little throughout the night with wife at the bedside. Discussed Foley to be d/cd prior to ambulation but requests to get up with physical therapy prior to [place] discontinuing to "make sure I will be able to get up and use the BR". Will pass off in report to am nurse. Also patient requested to take his own eye drops and stated that he had his home narcotics in his possesion. Explained to him the importance of not self-medicating and allowing [place] to monitor and control his pain. Wife aware that she should take the meds home today.

## 2013-04-02 NOTE — Progress Notes (Signed)
Subjective: Patient reports Offers no significant complaints able to stand and ambulate Foley recently removed patient feels urgency but unable to void  Objective: Vital signs in last 24 hours: Temp:  [97.2 F (36.2 C)-98.3 F (36.8 C)] 98.2 F (36.8 C) (06/25 0616) Pulse Rate:  [68-83] 83 (06/25 0616) Resp:  [12-20] 18 (06/25 0616) BP: (109-140)/(60-75) 118/60 mmHg (06/25 0616) SpO2:  [95 %-99 %] 98 % (06/25 0616)  Intake/Output from previous day: 06/24 0701 - 06/25 0700 In: 2630 [I.V.:2450; Blood:180] Out: 2480 [Urine:1980; Blood:500] Intake/Output this shift: Total I/O In: 360 [P.O.:360] Out: -   incision is clean dry. Patient is ambulatory.  Lab Results:  Recent Labs  03/31/13 1040 04/02/13 0521  WBC 5.0 6.9  HGB 16.8 14.0  HCT 47.7 40.7  PLT 155 133*   BMET  Recent Labs  03/31/13 1040 04/02/13 0521  NA 139 138  K 4.1 4.4  CL 106 105  CO2 27 26  GLUCOSE 128* 97  BUN 15 13  CREATININE 1.07 0.98  CALCIUM 10.1 8.7    Studies/Results: Dg Lumbar Spine 2-3 Views  04/01/2013   *RADIOLOGY REPORT*  Clinical Data: ALIF  DG C-ARM GT 120 MIN,LUMBAR SPINE - 2-3 VIEW  Technique: AP and lateral C-arm images of the lumbar spine  Comparison:  CT 03/20/2013  Findings: Interval placement of anterior fusion device L5-S1 in good position.  Interbody spacer remains in good position.  Bilateral pedicle screw fusion L5-S1 unchanged.  Ray cages at L3-4 and L4-5 also unchanged.  IMPRESSION: Satisfactory  ALIF L5-S1.   Original Report Authenticated By: Janeece Riggers, M.D.   Dg C-arm Gt 120 Min  04/01/2013   *RADIOLOGY REPORT*  Clinical Data: ALIF  DG C-ARM GT 120 MIN,LUMBAR SPINE - 2-3 VIEW  Technique: AP and lateral C-arm images of the lumbar spine  Comparison:  CT 03/20/2013  Findings: Interval placement of anterior fusion device L5-S1 in good position.  Interbody spacer remains in good position.  Bilateral pedicle screw fusion L5-S1 unchanged.  Ray cages at L3-4 and L4-5 also  unchanged.  IMPRESSION: Satisfactory  ALIF L5-S1.   Original Report Authenticated By: Janeece Riggers, M.D.   Dg Or Local Abdomen  04/01/2013   *RADIOLOGY REPORT*  Clinical Data: Postop ALIF  OR LOCAL ABDOMEN  Comparison: 02/06/2013  Findings: Pedicle screw interbody fusion L2-3 unchanged.  Threaded ray cages L3-4 unchanged.  Ray cages L4-5 unchanged.  Posterior pedicle screws L5 S1 unchanged.  Interval placement of anterior fusion hardware L5-S1 which appears to be in satisfactory position on this single AP view.  Posterior lateral bony fusion in the lumbar spine.  No retained instruments are identified in the field.  IMPRESSION: Surgical fusion as above.  No retained instruments identified.  I called results to the operating room and discussed with Amy   Original Report Authenticated By: Janeece Riggers, M.D.    Assessment/Plan: Stable postop day 1  LOS: 1 day  ambulation stool softeners and observation.   Torian Quintero J 04/02/2013, 8:56 AM

## 2013-04-02 NOTE — Evaluation (Addendum)
Physical Therapy Evaluation Patient Details Name: Seth Schwartz MRN: [id number] DOB: 1935/10/29 Today's Date: 04/02/2013 Time: 5621-3086 PT Time Calculation (min): 22 min  PT Assessment / Plan / Recommendation Clinical Impression  Pt will benefit from acute PT services to improve overall mobility and prepare for safe d/c home with wife.    PT Assessment  Patient needs continued PT services    Follow Up Recommendations  Outpatient PT (when MD indicates is appropriate)    Equipment Recommendations  None recommended by PT    Frequency Min 5X/week    Precautions / Restrictions Precautions Precautions: Back Precaution Booklet Issued: Yes (comment) Precaution Comments: Educated 3/3 back precaution   Pertinent Vitals/Pain Discomfort at incision site      Mobility  Bed Mobility Bed Mobility: Not assessed Transfers Transfers: Sit to Stand;Stand to Sit Sit to Stand: 5: Supervision;From chair/3-in-1 Stand to Sit: 5: Supervision;To chair/3-in-1 Details for Transfer Assistance: Supervision for safety with cues for hand placement Ambulation/Gait Ambulation/Gait Assistance: 4: Min guard;5: Supervision Ambulation Distance (Feet): 300 Feet (200' with RW + 100' without RW) Assistive device: Rolling walker Ambulation/Gait Assistance Details: Minguard for safety with cues for technique Gait Pattern: Within Functional Limits Gait velocity: decreased due to pain Stairs: No    Exercises     PT Diagnosis:    PT Problem List: Decreased activity tolerance;Decreased balance;Decreased mobility;Decreased knowledge of use of DME;Decreased knowledge of precautions;Pain PT Treatment Interventions: DME instruction;Gait training;Stair training;Functional mobility training;Therapeutic activities;Therapeutic exercise;Balance training;Patient/family education     PT Goals(Current goals can be found in the care plan section) Acute Rehab PT Goals Patient Stated Goal: To go home PT Goal  Formulation: With patient Time For Goal Achievement: 04/09/13 Potential to Achieve Goals: Good  Visit Information  Last PT Received On: 04/02/13 Assistance Needed: +1 PT/OT Co-Evaluation/Treatment: Yes History of Present Illness: s/p L5/S1 ALIF       Prior Functioning  Home Living Family/patient expects to be discharged to:: Private residence Living Arrangements: Spouse/significant other Available Help at Discharge: Family Type of Home: House Home Access: Stairs to enter Secretary/administrator of Steps: 2 + 1 Entrance Stairs-Rails: None Home Layout: Two level;Bed/bath upstairs Alternate Level Stairs-Number of Steps: 14 steps Alternate Level Stairs-Rails: Left Home Equipment: Walker - 2 wheels;Bedside commode;Shower seat Prior Function Level of Independence: Independent Communication Communication: No difficulties Dominant Hand: Right    Cognition  Cognition Arousal/Alertness: Awake/alert Behavior During Therapy: WFL for tasks assessed/performed Overall Cognitive Status: Within Functional Limits for tasks assessed    Extremity/Trunk Assessment Lower Extremity Assessment Lower Extremity Assessment: Overall WFL for tasks assessed   Balance    End of Session PT - End of Session Equipment Utilized During Treatment: Gait belt Activity Tolerance: Patient tolerated treatment well Patient left: in chair;with call bell/phone within reach Nurse Communication: Mobility status;Precautions  GP Functional Limitation: Mobility: Walking and moving around Mobility: Walking and Moving Around Current Status (V7846): At least 1 percent but less than 20 percent impaired, limited or restricted Mobility: Walking and Moving Around Goal Status [phone]): 0 percent impaired, limited or restricted   Seth Schwartz 04/02/2013, 11:16 AM Jake Shark, PT DPT [phone]

## 2013-04-02 NOTE — Evaluation (Signed)
Occupational Therapy Evaluation Patient Details Name: Seth Schwartz MRN: [id number] DOB: May 13, 1936 Today's Date: 04/02/2013 Time: 1191-4782 OT Time Calculation (min): 21 min  OT Assessment / Plan / Recommendation Clinical Impression  77 y.o. Pt s/p L5/S1 ALIF. Pt presents with below problem list. Pt will benefit from acute OT services to improve independence and prepare for safe d/c home with wife.     OT Assessment  Patient needs continued OT Services    Follow Up Recommendations  No OT follow up;Supervision/Assistance - 24 hour    Barriers to Discharge      Equipment Recommendations  None recommended by OT    Recommendations for Other Services    Frequency  Min 2X/week    Precautions / Restrictions Precautions Precautions: Back Precaution Booklet Issued: Yes (comment) Precaution Comments: Educated 3/3 back precaution   Pertinent Vitals/Pain Discomfort at incision site.     ADL  Eating/Feeding: Independent Where Assessed - Eating/Feeding: Chair Grooming: [name];Supervision/safety Where Assessed - Grooming: Supported sitting Upper Body Bathing: [name];Supervision/safety Where Assessed - Upper Body Bathing: Supported sitting Lower Body Bathing: Moderate assistance Where Assessed - Lower Body Bathing: Supported sit to stand Upper Body Dressing: [name];Supervision/safety Where Assessed - Upper Body Dressing: Supported sitting Lower Body Dressing: Moderate assistance Where Assessed - Lower Body Dressing: Supported sit to Pharmacist, hospital: Buyer, retail Method: Sit to Barista: Other (comment) (from recliner chair) Tub/Shower Transfer Method: Not assessed Equipment Used: Rolling walker Transfers/Ambulation Related to ADLs: Minguard/Supervision level for ambulation and supervision for transfers ADL Comments: Pt unable to cross legs for LB dressing-overall Mod A level. Discussed with pt and wife use of AE that  will be shown next session.     OT Diagnosis: Acute pain  OT Problem List: Decreased activity tolerance;Impaired balance (sitting and/or standing);Decreased knowledge of use of DME or AE;Decreased knowledge of precautions;Pain OT Treatment Interventions: Self-care/ADL training;DME and/or AE instruction;Therapeutic activities;Patient/family education;Balance training   OT Goals(Current goals can be found in the care plan section) Acute Rehab OT Goals Patient Stated Goal: To go home OT Goal Formulation: With patient Time For Goal Achievement: 04/09/13 Potential to Achieve Goals: Good ADL Goals Pt Will Perform Lower Body Bathing: with modified independence;with adaptive equipment;sit to/from stand;with caregiver independent in assisting Pt Will Perform Lower Body Dressing: with modified independence;with adaptive equipment;sit to/from stand;with caregiver independent in assisting Pt Will Transfer to Toilet: with modified independence;ambulating;bedside commode Pt Will Perform Toileting - Clothing Manipulation and hygiene: with modified independence;sit to/from stand Pt Will Perform Tub/Shower Transfer: Tub transfer;with supervision;ambulating;shower seat Additional ADL Goal #1: Pt will verbalize and demonstrate 3/3 back precautions independently.  Visit Information  Last OT Received On: 04/02/13 Assistance Needed: +1 PT/OT Co-Evaluation/Treatment: Yes History of Present Illness: s/p L5/S1 ALIF       Prior Functioning     Home Living Family/patient expects to be discharged to:: Private residence Living Arrangements: Spouse/significant other Available Help at Discharge: Family Type of Home: House Home Access: Stairs to enter Secretary/administrator of Steps: 2 + 1 Entrance Stairs-Rails: None Home Layout: Two level;Bed/bath upstairs Alternate Level Stairs-Number of Steps: 14 steps Alternate Level Stairs-Rails: Left Home Equipment: Walker - 2 wheels;Bedside commode;Shower  seat Prior Function Level of Independence: Independent Communication Communication: No difficulties Dominant Hand: Right         Vision/Perception     Cognition  Cognition Arousal/Alertness: Awake/alert Behavior During Therapy: WFL for tasks assessed/performed Overall Cognitive Status: Within Functional Limits for tasks assessed  Extremity/Trunk Assessment Upper Extremity Assessment Upper Extremity Assessment: Overall WFL for tasks assessed     Mobility Bed Mobility Bed Mobility: Not assessed Transfers Transfers: Sit to Stand;Stand to Sit Sit to Stand: 5: Supervision;From chair/3-in-1 Stand to Sit: 5: Supervision;To chair/3-in-1 Details for Transfer Assistance: Supervision for safety with cues for hand placement     Exercise     Balance     End of Session OT - End of Session Equipment Utilized During Treatment: Rolling walker Activity Tolerance: Patient tolerated treatment well Patient left: in chair;with call bell/phone within reach;with family/visitor present  GO Functional Assessment Tool Used: clinical judgment Functional Limitation: Self care Self Care Current Status ([id number]): At least 20 percent but less than 40 percent impaired, limited or restricted Self Care Goal Status (X3244): 0 percent impaired, limited or restricted   Earlie Raveling OTR/L [phone] 04/02/2013, 11:46 AM

## 2013-04-03 MED ORDER — OXYCODONE-ACETAMINOPHEN 10-325 MG PO TABS: 1.0000 | ORAL_TABLET | ORAL | Status: DC | PRN

## 2013-04-03 MED FILL — Sodium Chloride IV Soln 0.9%: INTRAVENOUS | Qty: 2000 | Status: AC

## 2013-04-03 MED FILL — Heparin Sodium (Porcine) Inj 1000 Unit/ML: INTRAMUSCULAR | Qty: 30 | Status: AC

## 2013-04-03 NOTE — Progress Notes (Signed)
Physical Therapy Treatment Patient Details Name: Seth Schwartz MRN: [id number] DOB: [birth year] Today's Date: 04/03/2013 Time: 4098-1191 PT Time Calculation (min): 17 min  PT Assessment / Plan / Recommendation  PT Comments   Pt progressing well with mobility with supervision only for bed mobility with wife and pt educated and handout discussed. Pt very pleasant and will not require therapy outside of the acute setting.   Follow Up Recommendations  No PT follow up     Does the patient have the potential to tolerate intense rehabilitation     Barriers to Discharge        Equipment Recommendations  None recommended by PT    Recommendations for Other Services    Frequency Min 3X/week   Progress towards PT Goals Progress towards PT goals: Progressing toward goals  Plan Discharge plan needs to be updated;Frequency needs to be updated    Precautions / Restrictions Precautions Precautions: Back Precaution Booklet Issued: Yes (comment) Precaution Comments: Educated 3/3 back precaution   Pertinent Vitals/Pain 4/10 back pain, premedicated    Mobility  Bed Mobility Bed Mobility: Right Sidelying to Sit;Sit to Sidelying Right Right Sidelying to Sit: 5: Supervision;HOB flat Sit to Sidelying Right: 5: Supervision;HOB flat Details for Bed Mobility Assistance: cueing for hand placement to prevent twisting Transfers Sit to Stand: 6: Modified independent (Device/Increase time);From bed Stand to Sit: 6: Modified independent (Device/Increase time);To bed Ambulation/Gait Ambulation/Gait Assistance: 7: Independent Ambulation Distance (Feet): 500 Feet Assistive device: None Gait Pattern: Within Functional Limits Gait velocity: WFL Stairs: Yes Stairs Assistance: 6: Modified independent (Device/Increase time) Stair Management Technique: One rail Left Number of Stairs: 12    Exercises     PT Diagnosis:    PT Problem List:   PT Treatment Interventions:     PT Goals (current goals can  now be found in the care plan section)    Visit Information  Last PT Received On: 04/03/13 Assistance Needed: +1 History of Present Illness: s/p L5/S1 ALIF    Subjective Data      Cognition  Cognition Arousal/Alertness: Awake/alert Behavior During Therapy: WFL for tasks assessed/performed Overall Cognitive Status: Within Functional Limits for tasks assessed    Balance     End of Session PT - End of Session Activity Tolerance: Patient tolerated treatment well Patient left: Other (comment);with family/visitor present;with nursing/sitter in room (standing in room) Nurse Communication: Mobility status;Precautions   GP     Delorse Lek 04/03/2013, 9:49 AM Delaney Meigs, PT [phone]

## 2013-04-03 NOTE — Discharge Summary (Signed)
Physician Discharge Summary  Patient ID: Seth Schwartz MRN: [id number] DOB/AGE: 1936-03-21 77 y.o.  Admit date: 04/01/2013 Discharge date: 04/03/2013  Admission Diagnoses: Pseudoarthrosis L5-S1 status post arthrodesis September 2013 status post fusion L2-L5 previously  Discharge Diagnoses: 2 arthrosis L5-S1 status post arthrodesis September 2013 Active Problems:   * No active hospital problems. *   Discharged Condition: good  Hospital Course: Patient was admitted to undergo surgical revision of her arthrodesis at L5-S1 via an anterior approach. He tolerated the surgery well.  Consults: None  Significant Diagnostic Studies: CT of lumbar spine  Treatments: Anterior lumbar interbody arthrodesis with peek spacer allograft and infused  Discharge Exam: Blood pressure 124/70, pulse 92, temperature 99.3 F (37.4 C), temperature source Oral, resp. rate 20, SpO2 94.00%. Incision is clean and dry. Bowel sounds are positive. Lower extremity strength station and gait are intact.  Disposition: 01-Home or Self Care  Discharge Orders   Future Orders Complete By Expires     Call MD for:  redness, tenderness, or signs of infection (pain, swelling, redness, odor or green/yellow discharge around incision site)  As directed     Call MD for:  severe uncontrolled pain  As directed     Call MD for:  temperature >100.4  As directed     Diet - low sodium heart healthy  As directed     Discharge instructions  As directed     Comments:      Okay to shower. Do not apply salves or appointments to incision. No heavy lifting with the upper extremities greater than 15 pounds. May resume driving when not requiring pain medication and patient feels comfortable with doing so.    Increase activity slowly  As directed         Medication List    TAKE these medications       alfuzosin 10 MG 24 hr tablet  Commonly known as:  UROXATRAL  Take 10 mg by mouth every evening.     aspirin EC 81 MG tablet  Take  81 mg by mouth daily. On hold for procedure.     BENEFIBER Powd  Take 10 mLs by mouth 2 (two) times daily.     cyclobenzaprine 10 MG tablet  Commonly known as:  FLEXERIL  Take 10 mg by mouth 3 (three) times daily as needed for muscle spasms.     diazepam 5 MG tablet  Commonly known as:  VALIUM  Take 5 mg by mouth every 6 (six) hours as needed for anxiety.     docusate sodium 100 MG capsule  Commonly known as:  COLACE  Take 100 mg by mouth 2 (two) times daily.     FISH OIL PO  Take by mouth.     fluvoxaMINE 50 MG tablet  Commonly known as:  LUVOX  Take 50 mg by mouth every morning.     losartan 50 MG tablet  Commonly known as:  COZAAR  Take 50 mg by mouth daily.     multivitamin with minerals Tabs  Take 1 tablet by mouth daily.     omeprazole 20 MG capsule  Commonly known as:  PRILOSEC  Take 20 mg by mouth 2 (two) times daily.     OVER THE COUNTER MEDICATION  Take 900 mg by mouth daily. Cholestoff     oxyCODONE-acetaminophen 10-325 MG per tablet  Commonly known as:  PERCOCET  Take 1 tablet by mouth every 4 (four) hours as needed for pain.     oxyCODONE-acetaminophen  10-325 MG per tablet  Commonly known as:  PERCOCET  Take 1 tablet by mouth 3 (three) times daily as needed for pain.     testosterone cypionate 100 MG/ML injection  Commonly known as:  DEPOTESTOTERONE CYPIONATE  Inject 50 mg into the muscle See admin instructions. For IM use only. Take every 10 days     timolol 0.5 % ophthalmic solution  Commonly known as:  BETIMOL  Place 1 drop into both eyes 2 (two) times daily.     traMADol 50 MG tablet  Commonly known as:  ULTRAM  Take 50 mg by mouth every 6 (six) hours as needed for pain.     traZODone 50 MG tablet  Commonly known as:  DESYREL  Take 100 mg by mouth at bedtime.     vitamin B-12 1000 MCG tablet  Commonly known as:  CYANOCOBALAMIN  Take 1,000 mcg by mouth daily.     Vitamin D 2000 UNITS tablet  Take 2,000 Units by mouth every evening.          SignedStefani Dama 04/03/2013, 9:03 AM

## 2013-04-03 NOTE — Care Management Note (Signed)
    Page 1 of 1   04/03/2013     2:08:00 PM   CARE MANAGEMENT NOTE 04/03/2013  Patient:  Seth Schwartz, Seth Schwartz   Account Number:  [id number]  Date Initiated:  04/03/2013  Documentation initiated by:  Elmer Bales  Subjective/Objective Assessment:   Pt admitted for lumbar surgery. Lives at home with wife.     Action/Plan:   Will follow for discharge needs.   Anticipated DC Date:  04/03/2013   Anticipated DC Plan:  HOME/SELF CARE      DC Planning Services  CM consult      Choice offered to / List presented to:             Status of service:  Completed, signed off Medicare Important Message given?   (If response is "NO", the following Medicare IM given date fields will be blank) Date Medicare IM given:   Date Additional Medicare IM given:    Discharge Disposition:  HOME/SELF CARE  Per UR Regulation:  Reviewed for med. necessity/level of care/duration of stay  If discussed at Long Length of Stay Meetings, dates discussed:    Comments:  04/03/13  1010  Elmer Bales RN, MSN CM- Met with patient and wife to discuss discharge planning.  Pt declines outpatient PT, stating that he has had multiple back surgeries and feels that he has a good understanding of post-op movements based on previous experience.  CM encouraged patient to speak with his surgeon if he feels he could use therapy in the future.

## 2013-04-03 NOTE — Progress Notes (Signed)
Occupational Therapy Treatment Patient Details Name: Seth Schwartz MRN: [id number] DOB: 10-Nov-1935 Today's Date: 04/03/2013 Time: 4098-1191 OT Time Calculation (min): 12 min  OT Assessment / Plan / Recommendation  OT comments  Practiced simulated shower transfer. Discussed toilet hygiene and pt not interested in AE as his wife is planning to assist with LB ADLs as needed.   Follow Up Recommendations  No OT follow up;Supervision/Assistance - 24 hour    Barriers to Discharge       Equipment Recommendations  None recommended by OT    Recommendations for Other Services    Frequency Min 2X/week   Progress towards OT Goals Progress towards OT goals: Progressing toward goals  Plan Discharge plan remains appropriate    Precautions / Restrictions Precautions Precautions: Back Precaution Booklet Issued: No Precaution Comments: Pt stated 3/3 back precautions   Pertinent Vitals/Pain Pain 7/10. Premedicated.    ADL  Toilet Transfer: Buyer, retail Method: Sit to Barista: Other (comment);Raised toilet seat with arms (or 3-in-1 over toilet) (from chair) Tub/Shower Transfer: Simulated;Supervision/safety Tub/Shower Transfer Method: Science writer: Shower seat with back;Walk in shower;Other (comment) (also used 3 in 1) Equipment Used: Rolling walker Transfers/Ambulation Related to ADLs: Supervision  ADL Comments:  Practiced simulated shower transfer at Supervision level. Recommended wife being with him when showering for safety. Pt had no concerns with LB dressing as his wife is going to assist him with this until he can cross his legs. Discussed performing hygiene and making sure pt keeps back straight while doing so. Discussed use of toilet aid to assist.     OT Diagnosis:    OT Problem List:   OT Treatment Interventions:     OT Goals(current goals can now be found in the care plan section)    Visit  Information  Last OT Received On: 04/03/13 Assistance Needed: +1 History of Present Illness: s/p L5/S1 ALIF    Subjective Data      Prior Functioning       Cognition  Cognition Arousal/Alertness: Awake/alert Behavior During Therapy: WFL for tasks assessed/performed Overall Cognitive Status: Within Functional Limits for tasks assessed    Mobility  Bed Mobility Bed Mobility: Not assessed Transfers Transfers: Sit to Stand;Stand to Sit Sit to Stand: 5: Supervision;From chair/3-in-1 Stand to Sit: 5: Supervision;To chair/3-in-1 Details for Transfer Assistance: cues to keep back straight    Exercises      Balance     End of Session OT - End of Session Activity Tolerance: Patient tolerated treatment well Patient left: with family/visitor present (up in room with visitors-about to d/c) Nurse Communication: Mobility status  GO     Earlie Raveling OTR/L [phone] 04/03/2013, 1:02 PM

## 2013-09-02 IMAGING — RF DG C-ARM GT 120 MIN
1 series · 2 of 2 positions shown · non-contrast
Comparison: CT 03/20/2013

CLINICAL DATA: ALIF

DG C-ARM GT 120 MIN,LUMBAR SPINE - 2-3 VIEW
TECHNIQUE: AP and lateral C-arm images of the lumbar spine

[Series 1: run · 2 of 2 slices shown]
[im 1/2]
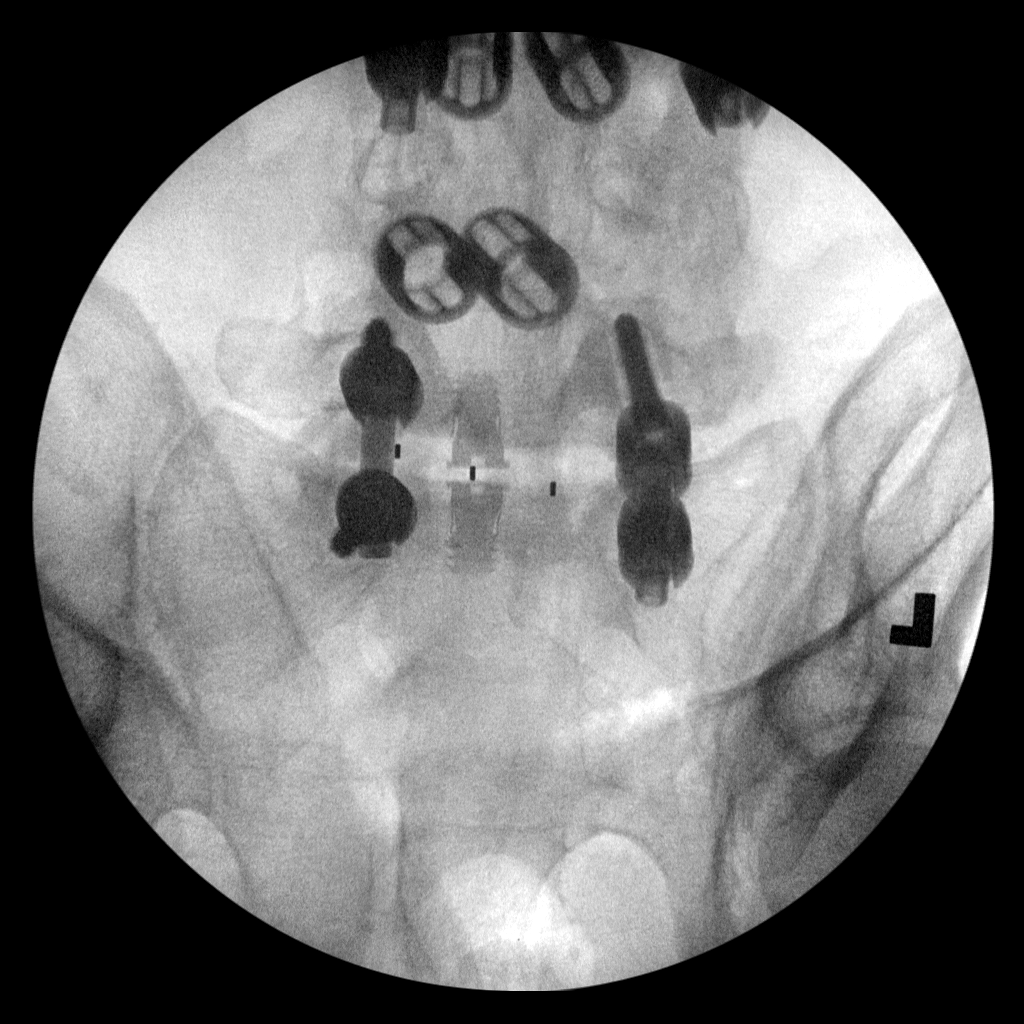
[im 2/2]
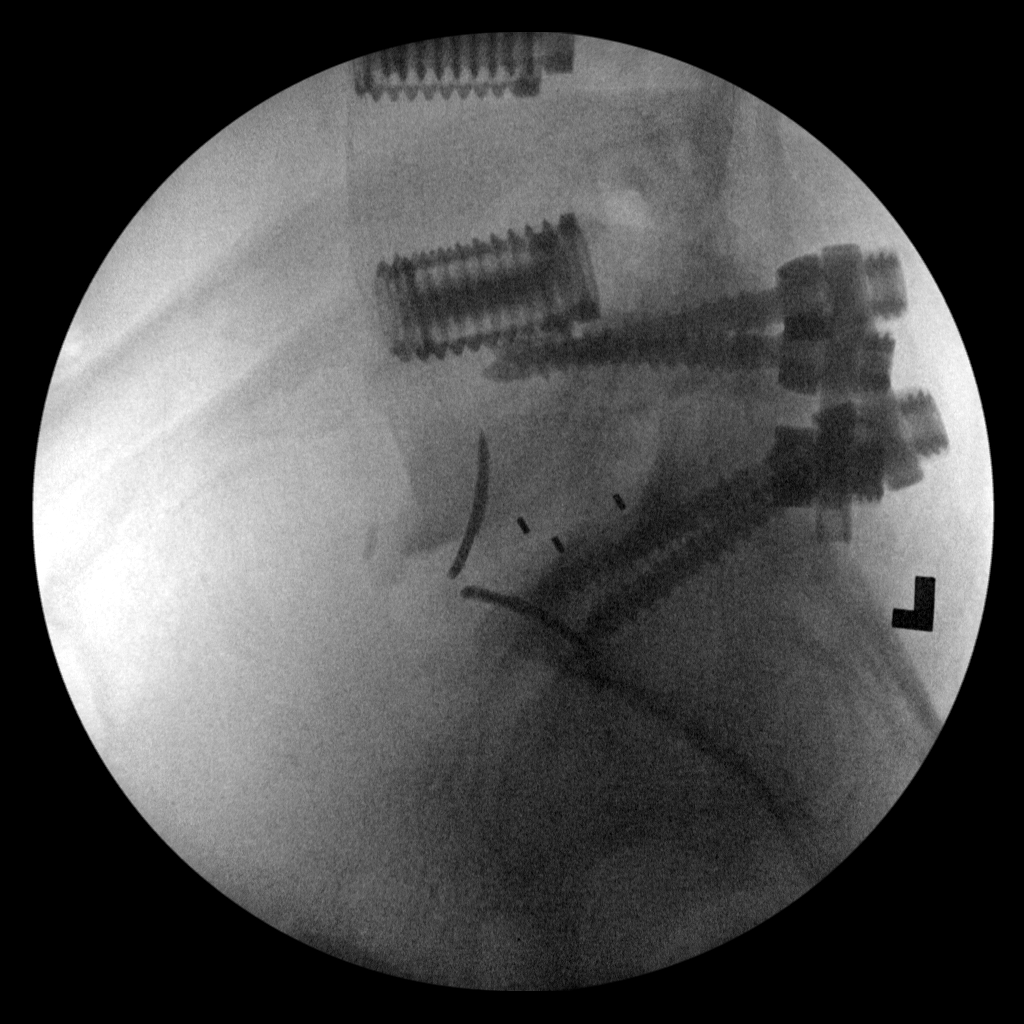

[2 of 2 positions shown; findings below may reference images not displayed]

FINDINGS: Interval placement of anterior fusion device L5-S1 in
good position.  Interbody spacer remains in good position.

Bilateral pedicle screw fusion L5-S1 unchanged.  Ray cages at L3-4
and L4-5 also unchanged.
IMPRESSION: Satisfactory  ALIF L5-S1.

## 2013-12-29 ENCOUNTER — Other Ambulatory Visit: Payer: Self-pay | Admitting: Gastroenterology

## 2013-12-29 DIAGNOSIS — R1310 Dysphagia, unspecified: Secondary | ICD-10-CM

## 2013-12-31 ENCOUNTER — Ambulatory Visit
Admission: RE | Admit: 2013-12-31 | Discharge: 2013-12-31 | Disposition: A | Discharge status: Home / Self Care | Payer: Medicare Other | Source: Ambulatory Visit | Attending: Gastroenterology | Admitting: Gastroenterology

## 2013-12-31 DIAGNOSIS — R1310 Dysphagia, unspecified: Secondary | ICD-10-CM

## 2014-01-07 HISTORY — PX: ESOPHAGOGASTRODUODENOSCOPY: SHX1529

## 2014-02-02 ENCOUNTER — Other Ambulatory Visit: Payer: Self-pay | Admitting: Gastroenterology

## 2014-04-20 ENCOUNTER — Other Ambulatory Visit: Payer: Self-pay | Admitting: Anesthesiology

## 2014-04-20 DIAGNOSIS — M5416 Radiculopathy, lumbar region: Secondary | ICD-10-CM

## 2014-04-20 DIAGNOSIS — M47812 Spondylosis without myelopathy or radiculopathy, cervical region: Secondary | ICD-10-CM

## 2014-04-22 ENCOUNTER — Ambulatory Visit
Admission: RE | Admit: 2014-04-22 | Discharge: 2014-04-22 | Disposition: A | Discharge status: Home / Self Care | Payer: Medicare Other | Source: Ambulatory Visit | Attending: Anesthesiology | Admitting: Anesthesiology

## 2014-04-22 VITALS — BP 118/68 | HR 75

## 2014-04-22 DIAGNOSIS — M5416 Radiculopathy, lumbar region: Secondary | ICD-10-CM

## 2014-04-22 DIAGNOSIS — M47812 Spondylosis without myelopathy or radiculopathy, cervical region: Secondary | ICD-10-CM

## 2014-04-22 MED ORDER — OXYCODONE-ACETAMINOPHEN 5-325 MG PO TABS
2.0000 | ORAL_TABLET | Freq: Once | ORAL | Status: AC
Start: 2014-04-22 — End: 2014-04-22
  Administered 2014-04-22: 2 via ORAL

## 2014-04-22 MED ORDER — DIAZEPAM 5 MG PO TABS
10.0000 mg | ORAL_TABLET | Freq: Once | ORAL | Status: AC
  Administered 2014-04-22: 10 mg via ORAL

## 2014-04-22 MED ORDER — IOHEXOL 300 MG/ML  SOLN
15.0000 mL | Freq: Once | INTRAMUSCULAR | Status: AC | PRN
  Administered 2014-04-22: 15 mL via INTRATHECAL

## 2014-04-22 NOTE — Progress Notes (Signed)
Patient states he has been off Cymbalta and Trazodone for at least the past two days.  jkl

## 2014-04-22 NOTE — Discharge Instructions (Signed)
Myelogram Discharge Instructions  1. Go home and rest quietly for the next 24 hours.  It is important to lie flat for the next 24 hours.  Get up only to go to the restroom.  You may lie in the bed or on a couch on your back, your stomach, your left side or your right side.  You may have one pillow under your head.  You may have pillows between your knees while you are on your side or under your knees while you are on your back.  2. DO NOT drive today.  Recline the seat as far back as it will go, while still wearing your seat belt, on the way home.  3. You may get up to go to the bathroom as needed.  You may sit up for 10 minutes to eat.  You may resume your normal diet and medications unless otherwise indicated.  Drink plenty of extra fluids today and tomorrow.  4. The incidence of a spinal headache with nausea and/or vomiting is about 5% (one in 20 patients).  If you develop a headache, lie flat and drink plenty of fluids until the headache goes away.  Caffeinated beverages may be helpful.  If you develop severe nausea and vomiting or a headache that does not go away with flat bed rest, call [phone].  5. You may resume normal activities after your 24 hours of bed rest is over; however, do not exert yourself strongly or do any heavy lifting tomorrow.  6. Call your physician for a follow-up appointment.   You may resume Cymbalta and Trazodone on Thursday, April 23, 2014 after 1:00pm.

## 2014-05-13 ENCOUNTER — Other Ambulatory Visit: Payer: Self-pay | Admitting: Neurological Surgery

## 2014-05-25 ENCOUNTER — Encounter (HOSPITAL_COMMUNITY): Payer: Self-pay | Admitting: Pharmacy Technician

## 2014-05-26 NOTE — Pre-Procedure Instructions (Addendum)
Seth Schwartz  05/26/2014   Your procedure is scheduled on:  Tuesday, August 25.  Report to [facility] Admitting at 12:30 PM  Call this number if you have problems the morning of surgery: [phone]   Remember:   Do not eat food or drink liquids after midnight Monday, August 24.   Take these medicines the morning of surgery with A SIP OF WATER:DULoxetine (CYMBALTA), methocarbamol (ROBAXIN), omeprazole (PRILOSEC),OxyCODONE (OXYCONTIN).              Take if needed oxyCODONE-acetaminophen (PERCOCET).                Stop all vitamins and Herbal products.including aspirin now    Do not wear jewelry, make-up or nail polish.  Do not wear lotions, powders, or perfumes.   Men may shave face and neck.  Do not bring valuables to the hospital.             [facility] is not responsible  for any belongings or valuables.               Contacts, dentures or bridgework may not be worn into surgery.  Leave suitcase in the car. After surgery it may be brought to your room.  For patients admitted to the hospital, discharge time is determined by your treatment team.               Patients discharged the day of surgery will not be allowed to drive home.  Name and phone number of your driver: -   Special Instructions: - Special Instructions: [place] - Preparing for Surgery  Before surgery, you can play an important role.  Because skin is not sterile, your skin needs to be as free of germs as possible.  You can reduce the number of germs on you skin by washing with CHG (chlorahexidine gluconate) soap before surgery.  CHG is an antiseptic cleaner which kills germs and bonds with the skin to continue killing germs even after washing.  Please DO NOT use if you have an allergy to CHG or antibacterial soaps.  If your skin becomes reddened/irritated stop using the CHG and inform your nurse when you arrive at Short Stay.  Do not shave (including legs and underarms) for at least 48 hours  prior to the first CHG shower.  You may shave your face.  Please follow these instructions carefully:   1.  Shower with CHG Soap the night before surgery and the morning of Surgery.  2.  If you choose to wash your hair, wash your hair first as usual with your normal shampoo.  3.  After you shampoo, rinse your hair and body thoroughly to remove the Shampoo.  4.  Use CHG as you would any other liquid soap.  You can apply chg directly  to the skin and wash gently with scrungie or a clean washcloth.  5.  Apply the CHG Soap to your body ONLY FROM THE NECK DOWN.  Do not use on open wounds or open sores.  Avoid contact with your eyes ears, mouth and genitals (private parts).  Wash genitals (private parts)       with your normal soap.  6.  Wash thoroughly, paying special attention to the area where your surgery will be performed.  7.  Thoroughly rinse your body with warm water from the neck down.  8.  DO NOT shower/wash with your normal soap after using and rinsing off the CHG Soap.  9.  Pat yourself dry with a clean towel.            10.  Wear clean pajamas.            11.  Place clean sheets on your bed the night of your first shower and do not sleep with pets.  Day of Surgery  Do not apply any lotions/deodorants the morning of surgery.  Please wear clean clothes to the hospital/surgery center.   Please read over the following fact sheets that you were given: Pain Booklet, Coughing and Deep Breathing and Surgical Site Infection Prevention

## 2014-05-27 ENCOUNTER — Encounter (HOSPITAL_COMMUNITY)
Admission: RE | Admit: 2014-05-27 | Discharge: 2014-05-27 | Disposition: A | Discharge status: Home / Self Care | Payer: Medicare Other | Source: Ambulatory Visit | Attending: Anesthesiology | Admitting: Anesthesiology

## 2014-05-27 ENCOUNTER — Encounter (HOSPITAL_COMMUNITY)
Admission: RE | Admit: 2014-05-27 | Discharge: 2014-05-27 | Disposition: A | Discharge status: Home / Self Care | Payer: Medicare Other | Source: Ambulatory Visit | Attending: Neurological Surgery | Admitting: Neurological Surgery

## 2014-05-27 ENCOUNTER — Encounter (HOSPITAL_COMMUNITY): Payer: Self-pay

## 2014-05-27 DIAGNOSIS — Z981 Arthrodesis status: Secondary | ICD-10-CM | POA: Diagnosis not present

## 2014-05-27 DIAGNOSIS — IMO0002 Reserved for concepts with insufficient information to code with codable children: Secondary | ICD-10-CM | POA: Diagnosis present

## 2014-05-27 DIAGNOSIS — Z01818 Encounter for other preprocedural examination: Secondary | ICD-10-CM | POA: Insufficient documentation

## 2014-05-27 LAB — CBC
HCT: 53 % — ABNORMAL HIGH (ref 39.0–52.0)
Hemoglobin: 18.3 g/dL — ABNORMAL HIGH (ref 13.0–17.0)
MCH: 31 pg (ref 26.0–34.0)
MCHC: 34.5 g/dL (ref 30.0–36.0)
MCV: 89.7 fL (ref 78.0–100.0)
Platelets: 175 10*3/uL (ref 150–400)
RBC: 5.91 MIL/uL — ABNORMAL HIGH (ref 4.22–5.81)
RDW: 13.2 % (ref 11.5–15.5)
WBC: 7.4 10*3/uL (ref 4.0–10.5)

## 2014-05-27 LAB — BASIC METABOLIC PANEL
Anion gap: 12 (ref 5–15)
BUN: 10 mg/dL (ref 6–23)
CO2: 26 mEq/L (ref 19–32)
Calcium: 10 mg/dL (ref 8.4–10.5)
Chloride: 103 mEq/L (ref 96–112)
Creatinine, Ser: 1.05 mg/dL (ref 0.50–1.35)
GFR calc Af Amer: 78 mL/min — ABNORMAL LOW (ref >90)
GFR calc non Af Amer: 67 mL/min — ABNORMAL LOW (ref >90)
Glucose, Bld: 105 mg/dL — ABNORMAL HIGH (ref 70–99)
Potassium: 4.1 mEq/L (ref 3.7–5.3)
Sodium: 141 mEq/L (ref 137–147)

## 2014-05-27 LAB — SURGICAL PCR SCREEN
MRSA, PCR: NEGATIVE
Staphylococcus aureus: NEGATIVE

## 2014-05-27 NOTE — Progress Notes (Signed)
05/27/14 1004  OBSTRUCTIVE SLEEP APNEA  Have you ever been diagnosed with sleep apnea through a sleep study? No  Do you snore loudly (loud enough to be heard through closed doors)?  0  Do you often feel tired, fatigued, or sleepy during the daytime? 0  Has anyone observed you stop breathing during your sleep? 0  Do you have, or are you being treated for high blood pressure? 1  BMI more than 35 kg/m2? 0  Age over 76 years old? 1  Neck circumference greater than 40 cm/16 inches? 1 (16)  Gender: 1  Obstructive Sleep Apnea Score 4  Score 4 or greater  Results sent to PCP

## 2014-06-02 ENCOUNTER — Inpatient Hospital Stay (HOSPITAL_COMMUNITY): Payer: Medicare Other | Admitting: Anesthesiology

## 2014-06-02 ENCOUNTER — Inpatient Hospital Stay (HOSPITAL_COMMUNITY): Payer: Medicare Other

## 2014-06-02 ENCOUNTER — Encounter (HOSPITAL_COMMUNITY)
Admission: RE | Disposition: A | Discharge status: Home / Self Care | Payer: Self-pay | Source: Ambulatory Visit | Attending: Neurological Surgery

## 2014-06-02 ENCOUNTER — Inpatient Hospital Stay (HOSPITAL_COMMUNITY)
Admission: RE | Admit: 2014-06-02 | Discharge: 2014-06-04 | DRG: 472 | Disposition: A | Discharge status: Home / Self Care | Payer: Medicare Other | Source: Ambulatory Visit | Attending: Neurological Surgery | Admitting: Neurological Surgery

## 2014-06-02 ENCOUNTER — Encounter (HOSPITAL_COMMUNITY): Payer: Medicare Other | Admitting: Anesthesiology

## 2014-06-02 ENCOUNTER — Encounter (HOSPITAL_COMMUNITY): Payer: Self-pay | Admitting: Anesthesiology

## 2014-06-02 DIAGNOSIS — K219 Gastro-esophageal reflux disease without esophagitis: Secondary | ICD-10-CM | POA: Diagnosis present

## 2014-06-02 DIAGNOSIS — S129XXA Fracture of neck, unspecified, initial encounter: Secondary | ICD-10-CM | POA: Diagnosis present

## 2014-06-02 DIAGNOSIS — Z87891 Personal history of nicotine dependence: Secondary | ICD-10-CM

## 2014-06-02 DIAGNOSIS — IMO0002 Reserved for concepts with insufficient information to code with codable children: Secondary | ICD-10-CM | POA: Diagnosis not present

## 2014-06-02 DIAGNOSIS — K449 Diaphragmatic hernia without obstruction or gangrene: Secondary | ICD-10-CM | POA: Diagnosis present

## 2014-06-02 DIAGNOSIS — Y831 Surgical operation with implant of artificial internal device as the cause of abnormal reaction of the patient, or of later complication, without mention of misadventure at the time of the procedure: Secondary | ICD-10-CM | POA: Diagnosis present

## 2014-06-02 DIAGNOSIS — T84498A Other mechanical complication of other internal orthopedic devices, implants and grafts, initial encounter: Secondary | ICD-10-CM | POA: Diagnosis present

## 2014-06-02 DIAGNOSIS — F411 Generalized anxiety disorder: Secondary | ICD-10-CM | POA: Diagnosis present

## 2014-06-02 DIAGNOSIS — I10 Essential (primary) hypertension: Secondary | ICD-10-CM | POA: Diagnosis present

## 2014-06-02 DIAGNOSIS — F329 Major depressive disorder, single episode, unspecified: Secondary | ICD-10-CM | POA: Diagnosis present

## 2014-06-02 DIAGNOSIS — M549 Dorsalgia, unspecified: Secondary | ICD-10-CM | POA: Diagnosis present

## 2014-06-02 DIAGNOSIS — M25519 Pain in unspecified shoulder: Secondary | ICD-10-CM | POA: Diagnosis present

## 2014-06-02 DIAGNOSIS — G8929 Other chronic pain: Secondary | ICD-10-CM | POA: Diagnosis not present

## 2014-06-02 DIAGNOSIS — M542 Cervicalgia: Secondary | ICD-10-CM | POA: Diagnosis present

## 2014-06-02 DIAGNOSIS — F3289 Other specified depressive episodes: Secondary | ICD-10-CM | POA: Diagnosis present

## 2014-06-02 DIAGNOSIS — Z981 Arthrodesis status: Secondary | ICD-10-CM

## 2014-06-02 HISTORY — PX: ANTERIOR CERVICAL DECOMP/DISCECTOMY FUSION: SHX1161

## 2014-06-02 SURGERY — ANTERIOR CERVICAL DECOMPRESSION/DISCECTOMY FUSION 2 LEVELS
Anesthesia: General | Site: Spine Cervical

## 2014-06-02 MED ORDER — HYDROMORPHONE HCL PF 1 MG/ML IJ SOLN
0.2500 mg | INTRAMUSCULAR | Status: DC | PRN
  Administered 2014-06-02 (×4): 0.5 mg via INTRAVENOUS

## 2014-06-02 MED ORDER — EPHEDRINE SULFATE 50 MG/ML IJ SOLN
INTRAMUSCULAR | Status: AC
  Filled 2014-06-02: qty 1

## 2014-06-02 MED ORDER — TIMOLOL HEMIHYDRATE 0.5 % OP SOLN
1.0000 [drp] | Freq: Two times a day (BID) | OPHTHALMIC | Status: DC
  Administered 2014-06-02 – 2014-06-03 (×3): 1 [drp] via OPHTHALMIC
  Filled 2014-06-02: qty 10

## 2014-06-02 MED ORDER — OXYCODONE HCL 5 MG PO TABS
ORAL_TABLET | ORAL | Status: AC
  Filled 2014-06-02: qty 1

## 2014-06-02 MED ORDER — ROCURONIUM BROMIDE 50 MG/5ML IV SOLN
INTRAVENOUS | Status: AC
  Filled 2014-06-02: qty 1

## 2014-06-02 MED ORDER — LIDOCAINE-EPINEPHRINE 1 %-1:100000 IJ SOLN
INTRAMUSCULAR | Status: DC | PRN
Start: 2014-06-02 — End: 2014-06-02
  Administered 2014-06-02: 5 mL

## 2014-06-02 MED ORDER — ADULT MULTIVITAMIN W/MINERALS CH
1.0000 | ORAL_TABLET | Freq: Every day | ORAL | Status: DC
  Administered 2014-06-02 – 2014-06-04 (×3): 1 via ORAL
  Filled 2014-06-02 (×3): qty 1

## 2014-06-02 MED ORDER — SODIUM CHLORIDE 0.9 % IJ SOLN
3.0000 mL | Freq: Two times a day (BID) | INTRAMUSCULAR | Status: DC
  Administered 2014-06-02 – 2014-06-04 (×3): 3 mL via INTRAVENOUS

## 2014-06-02 MED ORDER — MAGNESIUM 400 MG PO TABS
400.0000 mg | ORAL_TABLET | Freq: Every day | ORAL | Status: DC
  Administered 2014-06-02: 400 mg via ORAL
  Filled 2014-06-02 (×2): qty 1

## 2014-06-02 MED ORDER — VECURONIUM BROMIDE 10 MG IV SOLR
INTRAVENOUS | Status: DC | PRN
  Administered 2014-06-02 (×2): 2 mg via INTRAVENOUS

## 2014-06-02 MED ORDER — HYDROMORPHONE HCL PF 1 MG/ML IJ SOLN
INTRAMUSCULAR | Status: AC
  Filled 2014-06-02: qty 1

## 2014-06-02 MED ORDER — ROCURONIUM BROMIDE 100 MG/10ML IV SOLN
INTRAVENOUS | Status: DC | PRN
  Administered 2014-06-02: 50 mg via INTRAVENOUS

## 2014-06-02 MED ORDER — DULOXETINE HCL 60 MG PO CPEP
60.0000 mg | ORAL_CAPSULE | Freq: Every day | ORAL | Status: DC
  Administered 2014-06-03 – 2014-06-04 (×2): 60 mg via ORAL
  Filled 2014-06-02 (×3): qty 1

## 2014-06-02 MED ORDER — EPHEDRINE SULFATE 50 MG/ML IJ SOLN
INTRAMUSCULAR | Status: DC | PRN
  Administered 2014-06-02: 5 mg via INTRAVENOUS
  Administered 2014-06-02 (×2): 10 mg via INTRAVENOUS
  Administered 2014-06-02: 5 mg via INTRAVENOUS
  Administered 2014-06-02 (×2): 10 mg via INTRAVENOUS

## 2014-06-02 MED ORDER — OXYCODONE-ACETAMINOPHEN 10-325 MG PO TABS: 1.0000 | ORAL_TABLET | Freq: Three times a day (TID) | ORAL | Status: DC | PRN

## 2014-06-02 MED ORDER — FENTANYL CITRATE 0.05 MG/ML IJ SOLN
INTRAMUSCULAR | Status: DC | PRN
  Administered 2014-06-02 (×5): 50 ug via INTRAVENOUS

## 2014-06-02 MED ORDER — KETOROLAC TROMETHAMINE 30 MG/ML IJ SOLN
INTRAMUSCULAR | Status: AC
  Administered 2014-06-02: 15 mg
  Filled 2014-06-02: qty 1

## 2014-06-02 MED ORDER — VANCOMYCIN HCL IN DEXTROSE 1-5 GM/200ML-% IV SOLN
INTRAVENOUS | Status: AC
  Administered 2014-06-02: 1000 mg via INTRAVENOUS
  Filled 2014-06-02: qty 200

## 2014-06-02 MED ORDER — POLYETHYLENE GLYCOL 3350 17 G PO PACK
17.0000 g | PACK | Freq: Every day | ORAL | Status: DC
  Administered 2014-06-02 – 2014-06-04 (×3): 17 g via ORAL
  Filled 2014-06-02 (×3): qty 1

## 2014-06-02 MED ORDER — PHENYLEPHRINE HCL 10 MG/ML IJ SOLN
INTRAMUSCULAR | Status: DC | PRN
  Administered 2014-06-02 (×5): 40 ug via INTRAVENOUS

## 2014-06-02 MED ORDER — TRAZODONE HCL 100 MG PO TABS
100.0000 mg | ORAL_TABLET | Freq: Every day | ORAL | Status: DC
  Administered 2014-06-02 – 2014-06-03 (×2): 100 mg via ORAL
  Filled 2014-06-02 (×2): qty 1

## 2014-06-02 MED ORDER — DIPHENHYDRAMINE HCL 50 MG/ML IJ SOLN
INTRAMUSCULAR | Status: DC | PRN
  Administered 2014-06-02: 12.5 mg via INTRAVENOUS

## 2014-06-02 MED ORDER — THROMBIN 5000 UNITS EX SOLR
CUTANEOUS | Status: DC | PRN
  Administered 2014-06-02 (×2): 5000 [IU] via TOPICAL

## 2014-06-02 MED ORDER — METHOCARBAMOL 500 MG PO TABS
250.0000 mg | ORAL_TABLET | Freq: Three times a day (TID) | ORAL | Status: DC
  Administered 2014-06-02: 250 mg via ORAL
  Administered 2014-06-03 (×2): via ORAL
  Administered 2014-06-03 – 2014-06-04 (×3): 250 mg via ORAL
  Filled 2014-06-02 (×6): qty 1

## 2014-06-02 MED ORDER — NEOSTIGMINE METHYLSULFATE 10 MG/10ML IV SOLN
INTRAVENOUS | Status: AC
  Filled 2014-06-02: qty 1

## 2014-06-02 MED ORDER — GLYCOPYRROLATE 0.2 MG/ML IJ SOLN: INTRAMUSCULAR | Status: DC | PRN

## 2014-06-02 MED ORDER — STERILE WATER FOR INJECTION IJ SOLN
INTRAMUSCULAR | Status: AC
  Filled 2014-06-02: qty 10

## 2014-06-02 MED ORDER — SODIUM CHLORIDE 0.9 % IV SOLN: 250.0000 mL | INTRAVENOUS | Status: DC

## 2014-06-02 MED ORDER — LACTATED RINGERS IV SOLN
INTRAVENOUS | Status: DC
Start: 2014-06-02 — End: 2014-06-02
  Administered 2014-06-02: 08:00:00 via INTRAVENOUS

## 2014-06-02 MED ORDER — DIAZEPAM 5 MG PO TABS
5.0000 mg | ORAL_TABLET | Freq: Every day | ORAL | Status: DC
  Administered 2014-06-02 – 2014-06-03 (×3): 5 mg via ORAL
  Filled 2014-06-02 (×2): qty 1

## 2014-06-02 MED ORDER — PROPOFOL 10 MG/ML IV BOLUS
INTRAVENOUS | Status: AC
  Filled 2014-06-02: qty 20

## 2014-06-02 MED ORDER — ALUM & MAG HYDROXIDE-SIMETH 200-200-20 MG/5ML PO SUSP: 30.0000 mL | Freq: Four times a day (QID) | ORAL | Status: DC | PRN

## 2014-06-02 MED ORDER — SODIUM CHLORIDE 0.9 % IR SOLN
Status: DC | PRN
  Administered 2014-06-02: 11:00:00

## 2014-06-02 MED ORDER — MIDAZOLAM HCL 5 MG/5ML IJ SOLN
INTRAMUSCULAR | Status: DC | PRN
  Administered 2014-06-02 (×2): 1 mg via INTRAVENOUS

## 2014-06-02 MED ORDER — GLYCOPYRROLATE 0.2 MG/ML IJ SOLN
INTRAMUSCULAR | Status: AC
  Filled 2014-06-02: qty 3

## 2014-06-02 MED ORDER — ONDANSETRON HCL 4 MG/2ML IJ SOLN: 4.0000 mg | INTRAMUSCULAR | Status: DC | PRN

## 2014-06-02 MED ORDER — PROPOFOL 10 MG/ML IV BOLUS
INTRAVENOUS | Status: DC | PRN
  Administered 2014-06-02: 150 mg via INTRAVENOUS

## 2014-06-02 MED ORDER — ARTIFICIAL TEARS OP OINT
TOPICAL_OINTMENT | OPHTHALMIC | Status: DC | PRN
  Administered 2014-06-02: 1 via OPHTHALMIC

## 2014-06-02 MED ORDER — SODIUM CHLORIDE 0.9 % IJ SOLN
INTRAMUSCULAR | Status: AC
  Filled 2014-06-02: qty 10

## 2014-06-02 MED ORDER — OXYCODONE-ACETAMINOPHEN 5-325 MG PO TABS: 1.0000 | ORAL_TABLET | ORAL | Status: DC | PRN

## 2014-06-02 MED ORDER — OXYCODONE HCL 5 MG PO TABS
5.0000 mg | ORAL_TABLET | Freq: Once | ORAL | Status: AC | PRN
  Administered 2014-06-02: 5 mg via ORAL

## 2014-06-02 MED ORDER — LIDOCAINE HCL (CARDIAC) 20 MG/ML IV SOLN
INTRAVENOUS | Status: AC
  Filled 2014-06-02: qty 10

## 2014-06-02 MED ORDER — DIPHENHYDRAMINE HCL 50 MG/ML IJ SOLN
INTRAMUSCULAR | Status: AC
  Filled 2014-06-02: qty 1

## 2014-06-02 MED ORDER — KETOROLAC TROMETHAMINE 15 MG/ML IJ SOLN
15.0000 mg | Freq: Four times a day (QID) | INTRAMUSCULAR | Status: AC
  Administered 2014-06-02 – 2014-06-03 (×4): 15 mg via INTRAVENOUS
  Filled 2014-06-02 (×5): qty 1

## 2014-06-02 MED ORDER — DEXAMETHASONE SODIUM PHOSPHATE 4 MG/ML IJ SOLN
INTRAMUSCULAR | Status: AC
  Filled 2014-06-02: qty 1

## 2014-06-02 MED ORDER — OXYCODONE HCL ER 10 MG PO T12A
10.0000 mg | EXTENDED_RELEASE_TABLET | Freq: Every day | ORAL | Status: DC
  Administered 2014-06-02 – 2014-06-04 (×3): 10 mg via ORAL
  Filled 2014-06-02 (×3): qty 1

## 2014-06-02 MED ORDER — MIDAZOLAM HCL 2 MG/2ML IJ SOLN
INTRAMUSCULAR | Status: AC
  Filled 2014-06-02: qty 2

## 2014-06-02 MED ORDER — DEXAMETHASONE SODIUM PHOSPHATE 10 MG/ML IJ SOLN
INTRAMUSCULAR | Status: DC | PRN
  Administered 2014-06-02: 4 mg via INTRAVENOUS

## 2014-06-02 MED ORDER — OMEPRAZOLE-SODIUM BICARBONATE 20-1100 MG PO CAPS: 1.0000 | ORAL_CAPSULE | Freq: Every day | ORAL | Status: DC

## 2014-06-02 MED ORDER — ONDANSETRON HCL 4 MG/2ML IJ SOLN
INTRAMUSCULAR | Status: AC
  Filled 2014-06-02: qty 2

## 2014-06-02 MED ORDER — ACETAMINOPHEN 325 MG PO TABS: 650.0000 mg | ORAL_TABLET | ORAL | Status: DC | PRN

## 2014-06-02 MED ORDER — OXYCODONE HCL 5 MG PO TABS
5.0000 mg | ORAL_TABLET | ORAL | Status: DC | PRN
  Administered 2014-06-03: 5 mg via ORAL
  Filled 2014-06-02: qty 1

## 2014-06-02 MED ORDER — LOSARTAN POTASSIUM 50 MG PO TABS
50.0000 mg | ORAL_TABLET | Freq: Every day | ORAL | Status: DC
  Administered 2014-06-02 – 2014-06-04 (×3): 50 mg via ORAL
  Filled 2014-06-02 (×3): qty 1

## 2014-06-02 MED ORDER — DEXTROSE 5 % IV SOLN: 500.0000 mg | Freq: Four times a day (QID) | INTRAVENOUS | Status: DC | PRN

## 2014-06-02 MED ORDER — SODIUM BICARBONATE 650 MG PO TABS
1300.0000 mg | ORAL_TABLET | Freq: Every day | ORAL | Status: DC
  Administered 2014-06-03 – 2014-06-04 (×2): 1300 mg via ORAL
  Filled 2014-06-02 (×2): qty 2

## 2014-06-02 MED ORDER — BUPIVACAINE HCL (PF) 0.5 % IJ SOLN
INTRAMUSCULAR | Status: DC | PRN
  Administered 2014-06-02: 20 mL
  Administered 2014-06-02: 5 mL

## 2014-06-02 MED ORDER — FENTANYL CITRATE 0.05 MG/ML IJ SOLN
INTRAMUSCULAR | Status: AC
  Filled 2014-06-02: qty 5

## 2014-06-02 MED ORDER — NEOSTIGMINE METHYLSULFATE 10 MG/10ML IV SOLN
INTRAVENOUS | Status: DC | PRN
  Administered 2014-06-02: 4 mg via INTRAVENOUS

## 2014-06-02 MED ORDER — OXYCODONE HCL 5 MG/5ML PO SOLN: 5.0000 mg | Freq: Once | ORAL | Status: AC | PRN

## 2014-06-02 MED ORDER — PHENOL 1.4 % MT LIQD: 1.0000 | OROMUCOSAL | Status: DC | PRN

## 2014-06-02 MED ORDER — METHOCARBAMOL 500 MG PO TABS: 500.0000 mg | ORAL_TABLET | Freq: Four times a day (QID) | ORAL | Status: DC | PRN

## 2014-06-02 MED ORDER — PANTOPRAZOLE SODIUM 40 MG PO TBEC
80.0000 mg | DELAYED_RELEASE_TABLET | Freq: Every day | ORAL | Status: DC
  Administered 2014-06-02 – 2014-06-04 (×3): 80 mg via ORAL
  Filled 2014-06-02 (×4): qty 2

## 2014-06-02 MED ORDER — PROMETHAZINE HCL 25 MG/ML IJ SOLN: 6.2500 mg | INTRAMUSCULAR | Status: DC | PRN

## 2014-06-02 MED ORDER — LIDOCAINE HCL 4 % MT SOLN
OROMUCOSAL | Status: DC | PRN
  Administered 2014-06-02: 2 mL via TOPICAL

## 2014-06-02 MED ORDER — OXYCODONE-ACETAMINOPHEN 5-325 MG PO TABS
ORAL_TABLET | ORAL | Status: AC
  Filled 2014-06-02: qty 2

## 2014-06-02 MED ORDER — VECURONIUM BROMIDE 10 MG IV SOLR
INTRAVENOUS | Status: AC
  Filled 2014-06-02: qty 10

## 2014-06-02 MED ORDER — MENTHOL 3 MG MT LOZG: 1.0000 | LOZENGE | OROMUCOSAL | Status: DC | PRN

## 2014-06-02 MED ORDER — OXYCODONE-ACETAMINOPHEN 5-325 MG PO TABS
1.0000 | ORAL_TABLET | ORAL | Status: DC | PRN
  Administered 2014-06-02: 2 via ORAL
  Administered 2014-06-03: 1 via ORAL
  Administered 2014-06-03 – 2014-06-04 (×3): 2 via ORAL
  Filled 2014-06-02 (×4): qty 2

## 2014-06-02 MED ORDER — GLYCOPYRROLATE 0.2 MG/ML IJ SOLN
INTRAMUSCULAR | Status: DC | PRN
  Administered 2014-06-02 (×2): 0.1 mg via INTRAVENOUS
  Administered 2014-06-02: 0.2 mg via INTRAVENOUS
  Administered 2014-06-02: .6 mg via INTRAVENOUS

## 2014-06-02 MED ORDER — LACTATED RINGERS IV SOLN
INTRAVENOUS | Status: DC | PRN
  Administered 2014-06-02 (×3): via INTRAVENOUS

## 2014-06-02 MED ORDER — ONDANSETRON HCL 4 MG/2ML IJ SOLN
INTRAMUSCULAR | Status: DC | PRN
  Administered 2014-06-02: 4 mg via INTRAVENOUS

## 2014-06-02 MED ORDER — ACETAMINOPHEN 650 MG RE SUPP: 650.0000 mg | RECTAL | Status: DC | PRN

## 2014-06-02 MED ORDER — SODIUM CHLORIDE 0.9 % IJ SOLN: 3.0000 mL | INTRAMUSCULAR | Status: DC | PRN

## 2014-06-02 MED ORDER — DIAZEPAM 5 MG PO TABS
ORAL_TABLET | ORAL | Status: AC
  Filled 2014-06-02: qty 1

## 2014-06-02 MED ORDER — ARTIFICIAL TEARS OP OINT
TOPICAL_OINTMENT | OPHTHALMIC | Status: AC
Start: 2014-06-02 — End: 2014-06-02
  Filled 2014-06-02: qty 3.5

## 2014-06-02 MED ORDER — HYDROMORPHONE HCL PF 1 MG/ML IJ SOLN: 0.5000 mg | INTRAMUSCULAR | Status: DC | PRN

## 2014-06-02 MED ORDER — HEMOSTATIC AGENTS (NO CHARGE) OPTIME
TOPICAL | Status: DC | PRN
  Administered 2014-06-02: 1 via TOPICAL

## 2014-06-02 SURGICAL SUPPLY — 64 items
ADH SKN CLS APL DERMABOND .7 (GAUZE/BANDAGES/DRESSINGS) ×2
BAG DECANTER FOR FLEXI CONT (MISCELLANEOUS) ×3 IMPLANT
BIT DRILL NEURO 2X3.1 SFT TUCH (MISCELLANEOUS) ×1 IMPLANT
BLADE SURG 10 STRL SS (BLADE) ×4 IMPLANT
BNDG GAUZE ELAST 4 BULKY (GAUZE/BANDAGES/DRESSINGS) IMPLANT
BUR BARREL STRAIGHT FLUTE 4.0 (BURR) ×3 IMPLANT
BUR MATCHSTICK NEURO 3.0 LAGG (BURR) ×2 IMPLANT
CANISTER SUCT 3000ML (MISCELLANEOUS) ×3 IMPLANT
CONT SPEC 4OZ CLIKSEAL STRL BL (MISCELLANEOUS) ×3 IMPLANT
DECANTER SPIKE VIAL GLASS SM (MISCELLANEOUS) ×3 IMPLANT
DERMABOND ADVANCED (GAUZE/BANDAGES/DRESSINGS) ×4
DERMABOND ADVANCED .7 DNX12 (GAUZE/BANDAGES/DRESSINGS) ×1 IMPLANT
DRAPE LAPAROTOMY 100X72 PEDS (DRAPES) ×3 IMPLANT
DRAPE MICROSCOPE LEICA (MISCELLANEOUS) IMPLANT
DRAPE POUCH INSTRU U-SHP 10X18 (DRAPES) ×3 IMPLANT
DRILL NEURO 2X3.1 SOFT TOUCH (MISCELLANEOUS) ×3
DRSG OPSITE POSTOP 4X6 (GAUZE/BANDAGES/DRESSINGS) ×4 IMPLANT
DURAPREP 6ML APPLICATOR 50/CS (WOUND CARE) ×3 IMPLANT
ELECT REM PT RETURN 9FT ADLT (ELECTROSURGICAL) ×3
ELECTRODE REM PT RTRN 9FT ADLT (ELECTROSURGICAL) ×1 IMPLANT
EVACUATOR 1/8 PVC DRAIN (DRAIN) ×2 IMPLANT
GAUZE SPONGE 4X4 12PLY STRL (GAUZE/BANDAGES/DRESSINGS) ×3 IMPLANT
GAUZE SPONGE 4X4 16PLY XRAY LF (GAUZE/BANDAGES/DRESSINGS) ×2 IMPLANT
GLOVE BIOGEL PI IND STRL 7.0 (GLOVE) IMPLANT
GLOVE BIOGEL PI IND STRL 7.5 (GLOVE) IMPLANT
GLOVE BIOGEL PI IND STRL 8.5 (GLOVE) ×1 IMPLANT
GLOVE BIOGEL PI INDICATOR 7.0 (GLOVE) ×6
GLOVE BIOGEL PI INDICATOR 7.5 (GLOVE) ×2
GLOVE BIOGEL PI INDICATOR 8.5 (GLOVE) ×2
GLOVE ECLIPSE 6.5 STRL STRAW (GLOVE) ×2 IMPLANT
GLOVE ECLIPSE 7.5 STRL STRAW (GLOVE) ×4 IMPLANT
GLOVE ECLIPSE 8.5 STRL (GLOVE) ×3 IMPLANT
GLOVE EXAM NITRILE LRG STRL (GLOVE) IMPLANT
GLOVE EXAM NITRILE MD LF STRL (GLOVE) IMPLANT
GLOVE EXAM NITRILE XL STR (GLOVE) IMPLANT
GLOVE EXAM NITRILE XS STR PU (GLOVE) IMPLANT
GLOVE SURG SS PI 7.0 STRL IVOR (GLOVE) ×6 IMPLANT
GOWN STRL REUS W/ TWL LRG LVL3 (GOWN DISPOSABLE) IMPLANT
GOWN STRL REUS W/ TWL XL LVL3 (GOWN DISPOSABLE) IMPLANT
GOWN STRL REUS W/TWL 2XL LVL3 (GOWN DISPOSABLE) ×1 IMPLANT
GOWN STRL REUS W/TWL LRG LVL3 (GOWN DISPOSABLE) ×9
GOWN STRL REUS W/TWL XL LVL3 (GOWN DISPOSABLE) ×9
HALTER HD/CHIN CERV TRACTION D (MISCELLANEOUS) ×3 IMPLANT
KIT BASIN OR (CUSTOM PROCEDURE TRAY) ×3 IMPLANT
KIT ROOM TURNOVER OR (KITS) ×3 IMPLANT
NDL SPNL 22GX3.5 QUINCKE BK (NEEDLE) ×1 IMPLANT
NEEDLE HYPO 22GX1.5 SAFETY (NEEDLE) ×3 IMPLANT
NEEDLE SPNL 22GX3.5 QUINCKE BK (NEEDLE) ×3 IMPLANT
NS IRRIG 1000ML POUR BTL (IV SOLUTION) ×3 IMPLANT
PACK LAMINECTOMY NEURO (CUSTOM PROCEDURE TRAY) ×3 IMPLANT
PAD ARMBOARD 7.5X6 YLW CONV (MISCELLANEOUS) ×9 IMPLANT
PLATE ARCHON 1-LEVEL 26MM (Plate) ×2 IMPLANT
PLATE ARCHON 1-LEVEL 32MM (Plate) ×2 IMPLANT
RUBBERBAND STERILE (MISCELLANEOUS) IMPLANT
SCREW ARCHON SELFTAP 4.0X13 (Screw) ×16 IMPLANT
SPONGE INTESTINAL PEANUT (DISPOSABLE) ×3 IMPLANT
SPONGE SURGIFOAM ABS GEL SZ50 (HEMOSTASIS) ×3 IMPLANT
SUT ETHILON 3 0 FSL (SUTURE) ×2 IMPLANT
SUT VIC AB 3-0 SH 8-18 (SUTURE) ×6 IMPLANT
SYR 20ML ECCENTRIC (SYRINGE) ×3 IMPLANT
TOWEL OR 17X24 6PK STRL BLUE (TOWEL DISPOSABLE) ×3 IMPLANT
TOWEL OR 17X26 10 PK STRL BLUE (TOWEL DISPOSABLE) ×3 IMPLANT
TRAY FOLEY CATH 16FRSI W/METER (SET/KITS/TRAYS/PACK) ×2 IMPLANT
WATER STERILE IRR 1000ML POUR (IV SOLUTION) ×3 IMPLANT

## 2014-06-02 NOTE — H&P (Signed)
Chief complaint: Neck pain, shoulder pain HOPI:                                                   [name] is admitted to the hospital today.  It seems that this month he had a myelogram completed that was ordered by Dr. Maryjean Ka.he is been having neck and shoulder pain has been treated by Dr. Maryjean Ka with a series of conservative maneuvers including epidural steroid injections. Having failed efforts at this he underwent a CT myelogram. He has not had any significant weakness develop in his upper extremities. He has some chronic neck and back pain he said advanced spondylitic changes in both these regions over the years.     DATA:                                                  The myelogram demonstrates that he has an amply patent spinal canal, and his nerve roots look well decompressed.  However, on looking at the CT images, particularly on the AP view, there was a suggestion that he has a pseudoarthrosis at C3-4 and C6-7 in the neck.  In addition, it is suggested and noted that he has a pseudoarthrosis at L5-S1 despite the anterior lumbar interbody arthrodesis.  The hardware looks to be in good position, but bone graft is not fused across the interspace at L5-S1.  Similarly, he does not have or need significant decompression at C3-4 or C6-7, but there is some loosening of the inferior screws at C3-4 and also some loosening around the hardware at C6-7 suggesting and indicating a pseudoarthrosis.  REVIEW OF SYSTEMS:                        Today in the office, Seth Schwartz rates his Neck Disability Index at 52.  His Oswestry Disability for the Back is at 60, suggesting that he does have significant problems.  He notes that he has a very poor quality of life in terms of any activities beyond the activities of daily living.  He is not enjoying any extracurricular activities and is requiring some significant doses of narcotic pain medication.  He continues to have a considerably miserable time with all this.     Physical exam: He is awake alert and oriented. His pupils are 3 mm briskly reactive light and accommodation the extraocular movements are full. The face is symmetric to grimace tongue and uvula are in midline, sclera and conjunctiva are clear. The face has symmetric sensation. Range of motion of the neck is severely limited turning only 30 to the left and to the right flexing and extending approximately 50% of normal axial compression reproduces some moderate pain. Patient notes pain at the extreme of turning particularly to the right side. This is centralized posterior neck pain at the base of the shoulders. Is moderate spasm and trapezii bilaterally. There is tenderness in the subclavicular fossa is but no masses are noted. Station and gait are intact. Cerebellar testing is normal.  IMPRESSION/PLAN:  I reviewed the studies and noted to him that in order to get this to fuse, there are two approaches.  One would be to do a posterior supplemental fixation.  The other would to revise the anterior fixation.  I would suggest that if we did that, we would use his own bone from his anterior suprailiac crest.  It appears that Seth Schwartz had this type of fusion done on one of his early fusions, and though the graft site was painful, more so that the neck was, he did ultimately heal that region.  In light of the fact that he is doing so poorly in terms of his level of function, I believe that revising the neck anteriorly is a reasonable thing to do and consider.  We would hopefully only require a 1-2 day stay in the hospital.  I would request that he use a cervical collar during the postoperative period, despite having internal fixation.  We want to give this revision of a fusion every opportunity to heal fully.  We will plan on scheduling that at the earliest convenience.    We discussed the lumbar spine and though we can consider revision of this, I would like to see how his neck heals first  before we do anything in the lumbar spine.  I do not believe that either of these conditions would result in any significant nerve damage or paralysis if left alone.  The big issue is dealing with the chronic pain.

## 2014-06-02 NOTE — Anesthesia Postprocedure Evaluation (Signed)
Anesthesia Post Note  Patient: Seth Schwartz  Procedure(s) Performed: Procedure(s) (LRB): Cervical three-four, Cervical six-seven  Anterior Cervical Decompression/diskectomy/fusion with Iliac crest bone graft (N/A)  Anesthesia type: general  Patient location: PACU  Post pain: Pain level controlled  Post assessment: Patient's Cardiovascular Status Stable  Last Vitals:  Filed Vitals:   06/02/14 1358  BP:   Pulse: 68  Temp:   Resp: 12    Post vital signs: Reviewed and stable  Level of consciousness: sedated  Complications: No apparent anesthesia complications

## 2014-06-02 NOTE — Anesthesia Preprocedure Evaluation (Addendum)
Anesthesia Evaluation  Patient identified by MRN, date of birth, ID band Patient awake    Reviewed: Allergy & Precautions, H&P , NPO status   History of Anesthesia Complications (+) history of anesthetic complications  Airway Mallampati: II TM Distance: >3 FB Neck ROM: Limited   Comment: Pt c/o hoarse voice since last surgery Dental  (+) Teeth Intact, Dental Advisory Given, Edentulous Upper   Pulmonary former smoker,    Pulmonary exam normal       Cardiovascular hypertension, Pt. on medications     Neuro/Psych PSYCHIATRIC DISORDERS Anxiety Depression    GI/Hepatic Neg liver ROS, hiatal hernia, GERD-  Medicated,  Endo/Other  negative endocrine ROS  Renal/GU negative Renal ROS     Musculoskeletal   Abdominal   Peds  Hematology   Anesthesia Other Findings   Reproductive/Obstetrics                       Anesthesia Physical Anesthesia Plan  ASA: III  Anesthesia Plan: General   Post-op Pain Management:    Induction: Intravenous  Airway Management Planned: Oral ETT  Additional Equipment:   Intra-op Plan:   Post-operative Plan: Extubation in OR  Informed Consent: I have reviewed the patients History and Physical, chart, labs and discussed the procedure including the risks, benefits and alternatives for the proposed anesthesia with the patient or authorized representative who has indicated his/her understanding and acceptance.   Dental advisory given  Plan Discussed with: CRNA, Anesthesiologist and Surgeon  Anesthesia Plan Comments:        Anesthesia Quick Evaluation

## 2014-06-02 NOTE — Progress Notes (Signed)
Patient ID: Seth Schwartz, male   DOB: 01-Nov-1935, 78 y.o.   MRN: [id number] Vital signs are stable. Patient is awake and alert Motor function appears good in upper and lower extremities Stable

## 2014-06-02 NOTE — Transfer of Care (Signed)
Immediate Anesthesia Transfer of Care Note  Patient: Seth Schwartz  Procedure(s) Performed: Procedure(s): Cervical three-four, Cervical six-seven  Anterior Cervical Decompression/diskectomy/fusion with Iliac crest bone graft (N/A)  Patient Location: PACU  Anesthesia Type:General  Level of Consciousness: awake and alert   Airway & Oxygen Therapy: Patient Spontanous Breathing and Patient connected to nasal cannula oxygen  Post-op Assessment: Report given to PACU RN, Post -op Vital signs reviewed and stable and Patient moving all extremities  Post vital signs: Reviewed and stable  Complications: No apparent anesthesia complications

## 2014-06-02 NOTE — Op Note (Signed)
Date of surgery: 06/02/2014 Preoperative diagnosis: Pseudoarthrosis C3-4 C6-C7 status post arthrodesis C4-C6 Postoperative diagnosis: Pseudoarthrosis C3-4, C6-C7, status post arthrodesis C4-C6 Procedure: Revision of pseudoarthrosis anteriorly using iliac crest bone graft and anterior plate fixation C5-8 and C6-C7 Surgeon: [name] M.D. First assistant: Dayton Bailiff M.D. Anesthesia: Gen. endotracheal Indications: The patient is a 78 year old individual is had multiple fusions and cervical spine previously he has adjacent level disease little over a year ago and underwent anterior decompression and/or this at C3-4 and C6-C7. He has developed a pseudoarthrosis at each of these levels. He is complaining of significant neck and shoulder pain and wishes to have this repaired. We discussed options including the use of iliac crest bone graft which he is desiring to have performed.  Procedure the patient was brought to the operating supine on a stretcher after the smooth induction of general endotracheal anesthesia he was placed in 5 pounds of halter traction the neck was prepped with alcohol and DuraPrep and draped in a sterile fashion as was the region of the left anterior superior iliac crest. Transverse incision was made through the neck and carried down to the platysma the plane between the sternocleidomastoid and strap muscles carefully dissected this area had previously been operated on. Prevertebral space was reached and then by dissecting carefully and the prevertebral space we reached the region of C3-4 superiorly and C6-C7 inferiorly. The plate was identified and at C3-C4 there was noted to be some bone growing ventral to the plate. This was carefully removed with an osteotome. The plate was uncovered the screws were loosened and the plate was removed. A cleft could be identified between the level of C3-C4 and this was drilled out with a 2.2 mm dissecting bitten high-speed drill. Interspace was cleaned  and curetted until down to bone on either side. He was then packed off for a later fusion. At C6 C7-1 similar procedure was carried out however here the superior left screw of C6 vertebrae was broken. This required drilling around the screw and using Etrafon 2 remove the screw. Thus when the disc space was entered there was noted to be a thin shell of bone between the screw head and the a partial corpectomy was performed the C6 vertebrae. When the corpectomy was completed and bony exposure of C7 was obtained and confirmed the incision was made over the left anterior superior iliac crest. She was carried down to the tricortical crest which was then uncovered both medially and laterally. A sagittal saw was then used to cut a 14 mm graft for the C6-C7 level and a separate 5 mm graft for the C3-C4 level. The graft was then shaped and sized hemostasis was obtained from the bony donor site using pledgets of Gelfoam which were packed into the bone space and then the excess was removed. A sponge was used to pack this area off for later closure the bone graft at C6-C7 with shape and tamped into the interspace and countersunk appropriately. A 20 mm plate was then fitted over the ventral aspect of the vertebral body from C6 to C7. This was fitted with variable-angle 13 mm screws. A 5 mm graft was then placed in the C3-4 and here a similar size but smaller plate was placed across C3-C4 this was an invasive plate in both instances. Final x-ray was obtained which identified good position of the plate and the hardware. Hemostasis was achieved in the soft tissues of the neck and of the iliac crest incision. A small Hemovac  drain was then used in the neck and brought out through separate stab incision and secured with 3-0 nylon. The abdominal wound was closed with 201 3-0 Vicryl in interrupted fashion and Dermabond was placed over both incisions. Honeycomb dressing was placed over these. Total blood loss was estimated at 200 cc.

## 2014-06-02 NOTE — Addendum Note (Signed)
Addendum created 06/02/14 1531 by Suzy Bouchard, CRNA   Modules edited: Charges VN

## 2014-06-02 NOTE — Progress Notes (Signed)
Pt oob, ambulated, eating supper, left off monitor , wife at bedside, pt waiting on 4 N bed

## 2014-06-03 ENCOUNTER — Encounter (HOSPITAL_COMMUNITY): Payer: Self-pay | Admitting: Neurological Surgery

## 2014-06-03 MED ORDER — MAGNESIUM OXIDE 400 (241.3 MG) MG PO TABS
400.0000 mg | ORAL_TABLET | Freq: Every day | ORAL | Status: DC
  Administered 2014-06-03 – 2014-06-04 (×2): 400 mg via ORAL
  Filled 2014-06-03 (×2): qty 1

## 2014-06-03 MED FILL — Magnesium Oxide Tab 400 MG (241.3 MG Elemental Mg): ORAL | Qty: 1 | Status: AC

## 2014-06-03 NOTE — Clinical Social Work Note (Signed)
CSW consulted for possible SNF placement at time of discharge. Per pt's chart, pt to be discharged home with no PT follow-up. CSW signing off. Thank you for the referral.  Lubertha Sayres, MSW, [facility] Licensed Clinical Social Worker [phone] and [phone]

## 2014-06-03 NOTE — Progress Notes (Signed)
Utilization review completed.  

## 2014-06-03 NOTE — Evaluation (Signed)
Physical Therapy Evaluation Patient Details Name: Seth Schwartz MRN: [id number] DOB: 1935/10/15 Today's Date: 06/03/2014   History of Present Illness  The patient is a 78 year old individual is had multiple fusions and cervical spine previously he has adjacent level disease little over a year ago and underwent anterior decompression and/or this at C3-4 and C6-C7. He has developed a pseudoarthrosis at each of these levels. Pt underwent revision of pseudoarthrosis anteriorly using iliac crest bone graft and anterior plate fixation G2-9 and C6-C7 on 8/25.  Clinical Impression  Pt presenting with L hip requiring pt to use RW for safe ambulation due to limiting L LE WBing due to pain. Pt with good comprehension of cervical precautions and anticipate pt to progress well enough to return home with assist of spouse and use of RW.    Follow Up Recommendations No PT follow up;Supervision/Assistance - 24 hour    Equipment Recommendations  None recommended by PT (may need BSC to elevate toliet)    Recommendations for Other Services       Precautions / Restrictions Precautions Precautions: Cervical;Fall Precaution Comments: handout provided (pt with good carryover) Restrictions Weight Bearing Restrictions: No      Mobility  Bed Mobility Overal bed mobility: Needs Assistance Bed Mobility: Rolling;Sidelying to Sit Rolling: Supervision Sidelying to sit: Supervision       General bed mobility comments: v/c's for technique, use of bed rail  Transfers Overall transfer level: Needs assistance Equipment used: Rolling walker (2 wheeled);None Transfers: Sit to/from Stand Sit to Stand: Min assist;Min guard         General transfer comment: minA with no walker, min guard with v/c's for safe hand placement when using RW  Ambulation/Gait Ambulation/Gait assistance: Min guard Ambulation Distance (Feet): 100 Feet Assistive device: Rolling walker (2 wheeled) Gait Pattern/deviations:  Step-through pattern;Decreased step length - left;Decreased stance time - left;Decreased stride length;Antalgic     General Gait Details: attempted without RW however unable due to increased L hip pain. Pt with improved ability to amb with RW and increased stability due to ability to WB thru UEs  Stairs            Wheelchair Mobility    Modified Rankin (Stroke Patients Only)       Balance Overall balance assessment:  (requires RW for safe standing and amb)                                           Pertinent Vitals/Pain Pain Assessment: 0-10 Pain Score: 8  Pain Location: L hip Pain Descriptors / Indicators: Sharp;Stabbing Pain Intervention(s): Patient requesting pain meds-RN notified    Home Living Family/patient expects to be discharged to:: Private residence Living Arrangements: Spouse/significant other Available Help at Discharge: Family;Available 24 hours/day Type of Home: House Home Access: Stairs to enter Entrance Stairs-Rails: None Entrance Stairs-Number of Steps: 2 Home Layout: Two level;Bed/bath upstairs Home Equipment: Walker - 2 wheels      Prior Function Level of Independence: Independent               Hand Dominance   Dominant Hand: Right    Extremity/Trunk Assessment   Upper Extremity Assessment: Overall WFL for tasks assessed           Lower Extremity Assessment: Overall WFL for tasks assessed      Cervical / Trunk Assessment:  (recent surgery)  Communication   Communication:  No difficulties  Cognition Arousal/Alertness: Awake/alert Behavior During Therapy: WFL for tasks assessed/performed Overall Cognitive Status: Within Functional Limits for tasks assessed                      General Comments      Exercises General Exercises - Lower Extremity Hip Flexion/Marching: AROM;Left;10 reps;Standing      Assessment/Plan    PT Assessment Patient needs continued PT services  PT Diagnosis  Difficulty walking;Acute pain   PT Problem List Decreased strength;Decreased range of motion;Decreased activity tolerance;Decreased balance;Decreased mobility;Decreased knowledge of use of DME  PT Treatment Interventions DME instruction;Gait training;Stair training;Functional mobility training;Therapeutic activities;Therapeutic exercise   PT Goals (Current goals can be found in the Care Plan section) Acute Rehab PT Goals Patient Stated Goal: home PT Goal Formulation: With patient Time For Goal Achievement: 06/10/14 Potential to Achieve Goals: Good    Frequency Min 5X/week   Barriers to discharge        Co-evaluation               End of Session Equipment Utilized During Treatment: Gait belt Activity Tolerance: Patient tolerated treatment well Patient left: in chair;with call bell/phone within reach;with family/visitor present;with nursing/sitter in room Nurse Communication: Mobility status (to amb with pt 2-3x/day)         Time: 9629-5284 PT Time Calculation (min): 23 min   Charges:   PT Evaluation $Initial PT Evaluation Tier I: 1 Procedure PT Treatments $Gait Training: 8-22 mins   PT G CodesKingsley Callander 06/03/2014, 8:14 AM  Kittie Plater, PT, DPT Pager #: [phone] Office #: [phone]

## 2014-06-03 NOTE — Evaluation (Signed)
Occupational Therapy Evaluation Patient Details Name: Seth Schwartz MRN: [id number] DOB: May 04, 1936 Today's Date: 06/03/2014    History of Present Illness The patient is a 78 year old individual is had multiple fusions and cervical spine previously he has adjacent level disease little over a year ago and underwent anterior decompression and/or this at C3-4 and C6-C7. He has developed a pseudoarthrosis at each of these levels. Pt underwent revision of pseudoarthrosis anteriorly using iliac crest bone graft and anterior plate fixation N8-2 and C6-C7 on 8/25.   Clinical Impression   Patient is s/p C3-4 and C6-7 surgery resulting in functional limitations due to the deficits listed below (see OT problem list). PTA independent Patient will benefit from skilled OT acutely to increase independence and safety with ADLS to allow discharge d/c home when appropriate.OT to follow acutely for shower transfer and cervical precautions with adls reinforcement. Pt could benefit from short term use of cervical brace     Follow Up Recommendations  No OT follow up    Equipment Recommendations  None recommended by OT    Recommendations for Other Services       Precautions / Restrictions Precautions Precautions: Cervical;Fall Precaution Comments: handout provided Restrictions Weight Bearing Restrictions: No      Mobility Bed Mobility               General bed mobility comments: pt able to verbalize. Pt sitting inc hair on arrival  Transfers Overall transfer level: Needs assistance Equipment used: Rolling walker (2 wheeled) Transfers: Sit to/from Stand Sit to Stand: Supervision         General transfer comment: required bil UE to complete task    Balance                                            ADL Overall ADL's : Needs assistance/impaired Eating/Feeding: Independent   Grooming: Wash/dry hands;Wash/dry face;Oral care;Supervision/safety (cues for safety  with cervical precautions)       Lower Body Bathing: Moderate assistance;Sit to/from stand Lower Body Bathing Details (indicate cue type and reason): wife to (A). Educated on long handle sponge and reacher with wash cloth options in sitting         Toilet Transfer: Supervision/safety;RW (requires bil UE )             General ADL Comments: Pt educated on cervical precautions. Pt verbalized being fit for a brace prior to surgery. RN Zella Ball called and notified pt reports brace measured prior to surgery. Pt very poor awareness to flexion and extension with talking. pt could benefit from short term use of brace to maintain precautions     Vision                     Perception     Praxis      Pertinent Vitals/Pain Pain Assessment: No/denies pain Pain Location: reports with standing Lt graft site sore     Hand Dominance Right   Extremity/Trunk Assessment Upper Extremity Assessment Upper Extremity Assessment: Overall WFL for tasks assessed   Lower Extremity Assessment Lower Extremity Assessment: Defer to PT evaluation   Cervical / Trunk Assessment Cervical / Trunk Assessment: Other exceptions (surg )   Communication Communication Communication: No difficulties   Cognition Arousal/Alertness: Awake/alert Behavior During Therapy: WFL for tasks assessed/performed Overall Cognitive Status: Within Functional Limits for tasks assessed  General Comments       Exercises       Shoulder Instructions      Home Living Family/patient expects to be discharged to:: Private residence Living Arrangements: Spouse/significant other Available Help at Discharge: Family;Available 24 hours/day Type of Home: House Home Access: Stairs to enter [employer] of Steps: 2 Entrance Stairs-Rails: None Home Layout: Two level;Bed/bath upstairs Alternate Level Stairs-Number of Steps: 12 Alternate Level Stairs-Rails: Left Bathroom Shower/Tub:  Occupational psychologist: Standard     Home Equipment: Environmental consultant - 2 wheels;Shower seat;Bedside commode          Prior Functioning/Environment Level of Independence: Independent             OT Diagnosis: Generalized weakness;Acute pain   OT Problem List: Decreased strength;Decreased activity tolerance;Impaired balance (sitting and/or standing);Decreased safety awareness;Decreased knowledge of use of DME or AE;Decreased knowledge of precautions;Pain   OT Treatment/Interventions: Self-care/ADL training;Therapeutic exercise;DME and/or AE instruction;Therapeutic activities;Patient/family education;Balance training    OT Goals(Current goals can be found in the care plan section) Acute Rehab OT Goals Patient Stated Goal: home OT Goal Formulation: With patient Time For Goal Achievement: 06/17/14 Potential to Achieve Goals: Good  OT Frequency: Min 2X/week   Barriers to D/C:            Co-evaluation              End of Session Equipment Utilized During Treatment: Rolling walker Nurse Communication: Mobility status;Precautions  Activity Tolerance: Patient tolerated treatment well Patient left: in chair;with call bell/phone within reach;with family/visitor present   Time: 9449-6759 OT Time Calculation (min): 27 min Charges:  OT General Charges $OT Visit: 1 Procedure OT Evaluation $Initial OT Evaluation Tier I: 1 Procedure OT Treatments $Self Care/Home Management : 23-37 mins G-Codes:    Peri Maris June 30, 2014, 3:22 PM Pager: [phone]

## 2014-06-04 MED ORDER — OXYCODONE-ACETAMINOPHEN 5-325 MG PO TABS: 1.0000 | ORAL_TABLET | ORAL | Status: DC | PRN

## 2014-06-04 MED ORDER — PNEUMOCOCCAL VAC POLYVALENT 25 MCG/0.5ML IJ INJ: 0.5000 mL | INJECTION | INTRAMUSCULAR | Status: DC

## 2014-06-04 NOTE — Discharge Summary (Signed)
Physician Discharge Summary  Patient ID: Seth Schwartz MRN: [id number] DOB/AGE: 1936-05-18 78 y.o.  Admit date: 06/02/2014 Discharge date: 06/04/2014  Admission Diagnoses: Pseudoarthrosis C3-4 and C6-C7. Status post anterior decompression arthrodesis C3-4 C4-5 C5-6 C6-7  Discharge Diagnoses: Pseudoarthrosis C3-4 C5 C6-C7 status post anterior decompression arthrodesis C3-4 C4-5 C5-6 C6-C7. Diabetes mellitus. Hypertension.  Active Problems:   Pseudoarthrosis of cervical spine   Discharged Condition: good  Hospital Course: Patient was admitted to undergo surgical revision of fusion at C3-4 and she 6 C7. A pseudoarthrosis at formed at these 2 levels. He underwent surgery and fusion was performed with iliac crest bone graft. He tolerated the surgery well.  Consults: None  Significant Diagnostic Studies: None  Treatments: surgery: Anterior cervical revision of arthrodesis C3-4 she 67 arthrodesis with autograft from left anterior superior iliac crest bone and anterior plate fixation E4-2 P5-T6  Discharge Exam: Blood pressure 119/67, pulse 93, temperature 97.8 F (36.6 C), temperature source Oral, resp. rate 20, height 5\' 10"  (1.778 m), weight 90.719 kg (200 lb), SpO2 94.00%. Incision is clean and dry and neck. The incision in the left anterior superior iliac spine is clean and dry there is significant ecchymosis around incision the  Disposition: 01-Home or Self Care   Discharge Instructions   Call MD for:  redness, tenderness, or signs of infection (pain, swelling, redness, odor or green/yellow discharge around incision site)    Complete by:  As directed      Call MD for:  severe uncontrolled pain    Complete by:  As directed      Call MD for:  temperature >100.4    Complete by:  As directed      Diet - low sodium heart healthy    Complete by:  As directed      Discharge instructions    Complete by:  As directed   Okay to shower. Do not apply salves or appointments to incision. No  heavy lifting with the upper extremities greater than 15 pounds. May resume driving when not requiring pain medication and patient feels comfortable with doing so.     Increase activity slowly    Complete by:  As directed             Medication List         BENEFIBER Powd  Take 10 mLs by mouth daily with supper.     CHOLEST OFF PO  Take 900 mg by mouth daily.     diazepam 5 MG tablet  Commonly known as:  VALIUM  Take 5 mg by mouth at bedtime.     DULoxetine 60 MG capsule  Commonly known as:  CYMBALTA  Take 60 mg by mouth daily.     FISH OIL EXTRA STRENGTH PO  Take 1,500 mg by mouth daily.     ketotifen 0.025 % ophthalmic solution  Commonly known as:  ZADITOR  Place 1 drop into both eyes daily as needed (for eyes).     losartan 50 MG tablet  Commonly known as:  COZAAR  Take 50 mg by mouth daily.     Magnesium 400 MG Tabs  Take 400 mg by mouth daily.     methocarbamol 500 MG tablet  Commonly known as:  ROBAXIN  Take 250 mg by mouth 3 (three) times daily.     multivitamin with minerals Tabs tablet  Take 1 tablet by mouth daily.     omeprazole 40 MG capsule  Commonly known as:  PRILOSEC  Take  40 mg by mouth daily.     oxyCODONE-acetaminophen 5-325 MG per tablet  Commonly known as:  PERCOCET/ROXICET  Take 1 tablet by mouth every 4 (four) hours as needed for severe pain (give with one oxycodone 5mg  tablet to make 10/325).     oxyCODONE-acetaminophen 10-325 MG per tablet  Commonly known as:  PERCOCET  Take 1 tablet by mouth 3 (three) times daily as needed for pain.     OXYCONTIN 10 mg T12a 12 hr tablet  Generic drug:  OxyCODONE  Take 10 mg by mouth daily.     polyethylene glycol packet  Commonly known as:  MIRALAX / GLYCOLAX  Take 17 g by mouth daily.     PROSTATE HEALTH PO  Take 1 capsule by mouth daily with supper. TRUNATURE PROSTATE     testosterone cypionate 100 MG/ML injection  Commonly known as:  DEPOTESTOTERONE CYPIONATE  Inject 50 mg into the  muscle See admin instructions. For IM use only. Take every 10 days     timolol 0.5 % ophthalmic solution  Commonly known as:  BETIMOL  Place 1 drop into both eyes 2 (two) times daily.     traMADol 50 MG tablet  Commonly known as:  ULTRAM  Take 100 mg by mouth at bedtime.     traZODone 50 MG tablet  Commonly known as:  DESYREL  Take 100 mg by mouth at bedtime.     vitamin B-12 1000 MCG tablet  Commonly known as:  CYANOCOBALAMIN  Take 1,000 mcg by mouth daily.     Vitamin D 2000 UNITS tablet  Take 2,000 Units by mouth every evening.     ZEGERID PO  Take 1 capsule by mouth at bedtime. 10mg /1100mg          SignedEarleen Newport 06/04/2014, 5:23 PM

## 2014-06-04 NOTE — Discharge Instructions (Signed)
Cervical Spine Fracture, Stable °A cervical spine fracture is a break or crack in one of the bones of the neck. A fracture is stable if the chances of it causing you problems while it is healing are very small. °CAUSES  °· Vehicle accidents. °· Injuries from sports such as diving, football, biking, wrestling, or skiing. °· Occasionally, severe osteoporosis or other bone diseases, such as cancers that spread to bone or metabolic abnormalities. °SYMPTOMS  °· Severe neck pain after an accident or fall. °· Pain down your shoulders or arms. °· Bruising or swelling on the back of your neck. °· Numbness, tingling, muscle spasm, or weakness. °DIAGNOSIS  °Cervical spine fracture is diagnosed with the help of X-ray exams of your neck. Often a CT scan or MRI is used to confirm the diagnosis and help determine how your injury should be treated. Generally, an examination of your neck, arms, and legs, and the history of your injury prompts the health care provider to order these tests.  °TREATMENT  °A stable fracture needs to be treated with a brace or cervical collar. A cervical collar is a two-piece collar designed to keep your neck from moving during the healing process. °HOME CARE INSTRUCTIONS °· Limit physical activity to prevent worsening of the fracture. °· Apply ice to areas of pain 3-4 times a day for 2 days. °¨ Put ice in a bag. °¨ Place a towel between your skin and the bag. °¨ Leave the ice on for 15-20 minutes, 3-4 times a day. °· You may have been given a cervical collar to wear. °¨ Do not remove the collar unless instructed by your health care provider. °¨ If you have long hair, keep it outside of the collar. °¨ Ask your health care provider before making any adjustments to your collar. Minor adjustments may be required over time to improve comfort and reduce pressure on your chin or on the back of your head. °¨ Keep your collar clean by wiping it with mild soap and water and drying it completely. The pads can be  hand washed with soap and water and air dried completely. °¨ If you are allowed to remove the collar for cleaning or bathing, follow your health care provider's instructions on how to do so safely. °¨ If you are allowed to remove the collar for cleaning and bathing, wash and dry the skin of your neck. Check your skin for irritation or sores. If you see any, tell your health care provider. °· Only take over-the-counter or prescription medicines for pain, discomfort, or fever as directed by your health care provider.   °· Keep all follow-up appointments as directed by your health care provider. Not keeping an appointment could result in a chronic or permanent injury, pain, and disability. Additionally, X-rays or an MRI may be repeated 1-3 weeks after your initial appointment. This is to: °¨ Make sure any other breaks or cracks were not missed.   °¨ Help identify stretched or torn ligaments.   °· Get your test results if you did not get them when you were first evaluated. The results will determine whether you need other tests or treatment. It is your responsibility to get the results. °SEEK MEDICAL CARE IF: °You have irritation or sores on your skin from the cervical collar. °SEEK IMMEDIATE MEDICAL CARE IF:  °· You have increasing pain in your neck.   °· You develop difficulties swallowing or breathing. °· You develop swelling in your neck.   °· You have numbness, weakness, burning pain, or movement   problems in the arms or legs.   °· You are unable to control your bowel or bladder (incontinence).   °· You have problems with coordination or difficulty walking. °MAKE SURE YOU:  °· Understand these instructions. °· Will watch your condition. °· Will get help right away if you are not doing well or get worse. °Document Released: 08/12/2004 Document Revised: 09/30/2013 Document Reviewed: 04/21/2013 °[employer]® Patient Information ©2015 [employer]. This information is not intended to replace advice given to you by your  health care provider. Make sure you discuss any questions you have with your health care provider. ° °

## 2014-06-04 NOTE — Progress Notes (Signed)
Physical Therapy Treatment Patient Details Name: Seth Schwartz MRN: [id number] DOB: July 05, 1936 Today's Date: 06/04/2014    History of Present Illness The patient is a 78 year old individual is had multiple fusions and cervical spine previously he has adjacent level disease little over a year ago and underwent anterior decompression and/or this at C3-4 and C6-C7. He has developed a pseudoarthrosis at each of these levels. Pt underwent revision of pseudoarthrosis anteriorly using iliac crest bone graft and anterior plate fixation W5-8 and C6-C7 on 8/25.    PT Comments    Progressing well, pain decreasing.  Still loves to move his neck far too much and wife is on him about it.  Reinforced all education.  Ready for d/c.  Follow Up Recommendations  No PT follow up;Supervision/Assistance - 24 hour     Equipment Recommendations  None recommended by PT    Recommendations for Other Services       Precautions / Restrictions Precautions Precautions: Cervical Restrictions Weight Bearing Restrictions: No    Mobility  Bed Mobility               General bed mobility comments: reinforced technique  Transfers Overall transfer level: Modified independent   Transfers: Sit to/from Stand Sit to Stand: Modified independent (Device/Increase time)         General transfer comment: used hands appropriately  Ambulation/Gait Ambulation/Gait assistance: Modified independent (Device/Increase time) Ambulation Distance (Feet): 500 Feet Assistive device: Rolling walker (2 wheeled) Gait Pattern/deviations: Step-through pattern;Shuffle Gait velocity: slower   General Gait Details: hip too sore to walk far without RW.  Some shuffling of gait, but steady and safe.   Stairs Stairs: Yes Stairs assistance: Supervision Stair Management: One rail Left;Step to pattern;Forwards Number of Stairs: 5 General stair comments: steady and safe  Wheelchair Mobility    Modified Rankin (Stroke  Patients Only)       Balance Overall balance assessment: No apparent balance deficits (not formally assessed)                                  Cognition Arousal/Alertness: Awake/alert Behavior During Therapy: WFL for tasks assessed/performed Overall Cognitive Status: Within Functional Limits for tasks assessed                      Exercises      General Comments General comments (skin integrity, edema, etc.): Reinforced all back care and precautions, lifting restrictions and progression of activity.      Pertinent Vitals/Pain Pain Assessment:  (some left hip pain, not rated, better than yesterday)    Home Living                      Prior Function            PT Goals (current goals can now be found in the care plan section) Acute Rehab PT Goals Patient Stated Goal: home PT Goal Formulation: With patient Time For Goal Achievement: 06/10/14 Potential to Achieve Goals: Good Progress towards PT goals: Progressing toward goals    Frequency  Min 5X/week    PT Plan Current plan remains appropriate    Co-evaluation             End of Session   Activity Tolerance: Patient tolerated treatment well Patient left: in chair;with call bell/phone within reach;with family/visitor present     Time: 0998-3382 PT Time Calculation (min): 23 min  Charges:  $Gait Training: 8-22 mins $Self Care/Home Management: 8-22                    G Codes:      Sherill Mangen, Tessie Fass 06/04/2014, 10:48 AM 06/04/2014  Donnella Sham, [place] [phone]  (pager)

## 2014-06-04 NOTE — Progress Notes (Signed)
Patient discharged this evening. Wound site CDI. D/C instructions and prescription given. Assessments remained unchanged prior to discharge.

## 2014-07-08 ENCOUNTER — Other Ambulatory Visit: Payer: Self-pay | Admitting: Family Medicine

## 2014-07-08 DIAGNOSIS — R42 Dizziness and giddiness: Secondary | ICD-10-CM

## 2014-07-13 ENCOUNTER — Ambulatory Visit
Admission: RE | Admit: 2014-07-13 | Discharge: 2014-07-13 | Disposition: A | Discharge status: Home / Self Care | Payer: Medicare Other | Source: Ambulatory Visit | Attending: Family Medicine | Admitting: Family Medicine

## 2014-07-13 DIAGNOSIS — R42 Dizziness and giddiness: Secondary | ICD-10-CM

## 2014-07-20 ENCOUNTER — Other Ambulatory Visit: Payer: Self-pay | Admitting: Neurological Surgery

## 2014-07-20 DIAGNOSIS — M899 Disorder of bone, unspecified: Secondary | ICD-10-CM

## 2014-07-22 ENCOUNTER — Ambulatory Visit
Admission: RE | Admit: 2014-07-22 | Discharge: 2014-07-22 | Disposition: A | Discharge status: Home / Self Care | Payer: Medicare Other | Source: Ambulatory Visit | Attending: Neurological Surgery | Admitting: Neurological Surgery

## 2014-07-22 DIAGNOSIS — M899 Disorder of bone, unspecified: Secondary | ICD-10-CM

## 2014-07-22 MED ORDER — GADOBENATE DIMEGLUMINE 529 MG/ML IV SOLN
18.0000 mL | Freq: Once | INTRAVENOUS | Status: AC | PRN
  Administered 2014-07-22: 18 mL via INTRAVENOUS

## 2014-07-29 ENCOUNTER — Other Ambulatory Visit: Payer: Medicare Other

## 2014-08-03 ENCOUNTER — Ambulatory Visit
Admission: RE | Admit: 2014-08-03 | Discharge: 2014-08-03 | Disposition: A | Discharge status: Home / Self Care | Payer: Medicare Other | Source: Ambulatory Visit | Attending: Family Medicine | Admitting: Family Medicine

## 2014-08-03 ENCOUNTER — Other Ambulatory Visit: Payer: Self-pay | Admitting: Family Medicine

## 2014-08-03 DIAGNOSIS — Z87891 Personal history of nicotine dependence: Secondary | ICD-10-CM

## 2014-08-03 DIAGNOSIS — R9389 Abnormal findings on diagnostic imaging of other specified body structures: Secondary | ICD-10-CM

## 2014-08-10 ENCOUNTER — Other Ambulatory Visit: Payer: Self-pay | Admitting: Neurological Surgery

## 2014-08-11 ENCOUNTER — Other Ambulatory Visit: Payer: Self-pay | Admitting: Family Medicine

## 2014-08-11 DIAGNOSIS — R9089 Other abnormal findings on diagnostic imaging of central nervous system: Secondary | ICD-10-CM

## 2014-08-13 ENCOUNTER — Ambulatory Visit
Admission: RE | Admit: 2014-08-13 | Discharge: 2014-08-13 | Disposition: A | Discharge status: Home / Self Care | Payer: Medicare Other | Source: Ambulatory Visit | Attending: Family Medicine | Admitting: Family Medicine

## 2014-08-13 DIAGNOSIS — R9089 Other abnormal findings on diagnostic imaging of central nervous system: Secondary | ICD-10-CM

## 2014-08-14 ENCOUNTER — Ambulatory Visit
Admission: RE | Admit: 2014-08-14 | Discharge: 2014-08-14 | Disposition: A | Discharge status: Home / Self Care | Payer: Medicare Other | Source: Ambulatory Visit | Attending: Family Medicine | Admitting: Family Medicine

## 2014-08-14 ENCOUNTER — Other Ambulatory Visit: Payer: Self-pay | Admitting: Neurological Surgery

## 2014-08-14 DIAGNOSIS — I6781 Acute cerebrovascular insufficiency: Secondary | ICD-10-CM

## 2014-08-14 DIAGNOSIS — R9089 Other abnormal findings on diagnostic imaging of central nervous system: Secondary | ICD-10-CM

## 2014-08-14 MED ORDER — IOHEXOL 300 MG/ML  SOLN
80.0000 mL | Freq: Once | INTRAMUSCULAR | Status: AC | PRN
  Administered 2014-08-14: 80 mL via INTRAVENOUS

## 2014-08-23 ENCOUNTER — Ambulatory Visit
Admission: RE | Admit: 2014-08-23 | Discharge: 2014-08-23 | Disposition: A | Discharge status: Home / Self Care | Payer: Medicare Other | Source: Ambulatory Visit | Attending: Neurological Surgery | Admitting: Neurological Surgery

## 2014-08-23 DIAGNOSIS — I6781 Acute cerebrovascular insufficiency: Secondary | ICD-10-CM

## 2014-08-23 MED ORDER — GADOBENATE DIMEGLUMINE 529 MG/ML IV SOLN
19.0000 mL | Freq: Once | INTRAVENOUS | Status: AC | PRN
  Administered 2014-08-23: 19 mL via INTRAVENOUS

## 2014-12-14 IMAGING — CT CT HEAD W/O CM
2 series · 15 of 30 positions shown, 19 images · non-contrast
Comparison: None.

CLINICAL DATA: Dizziness.

EXAM:
CT HEAD WITHOUT CONTRAST
TECHNIQUE: Contiguous axial images were obtained from the base of the skull
through the vertex without intravenous contrast.

[Series 2: head w/o · axial · non-contrast · 0.49mm/px · z∈[+31,+160]mm · 13 of 28 slices shown, 17 images]
[im 2/28  brain]
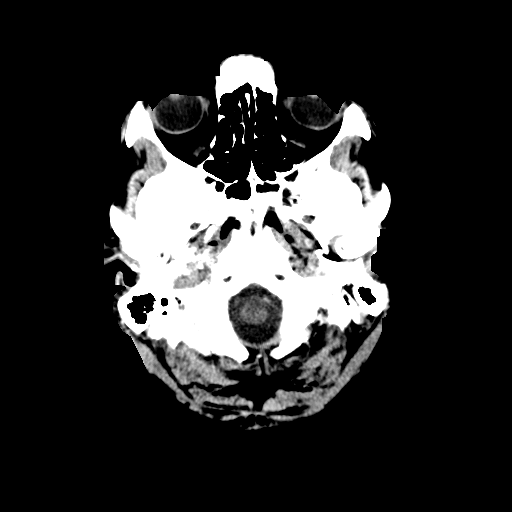
[im 2/28  bone]
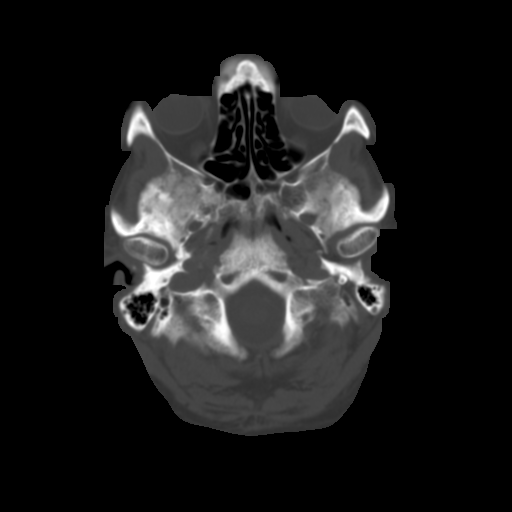
[im 4/28  brain]
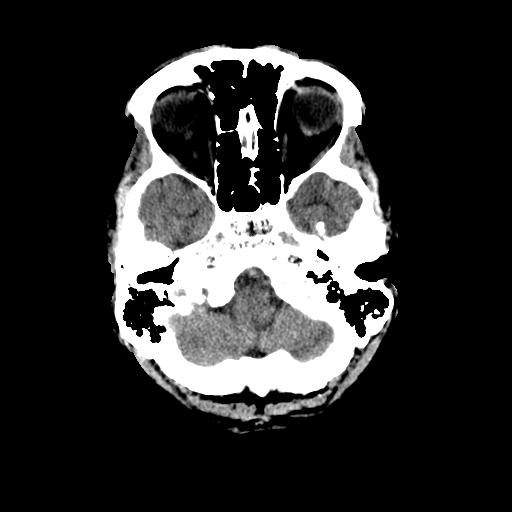
[im 6/28  brain]
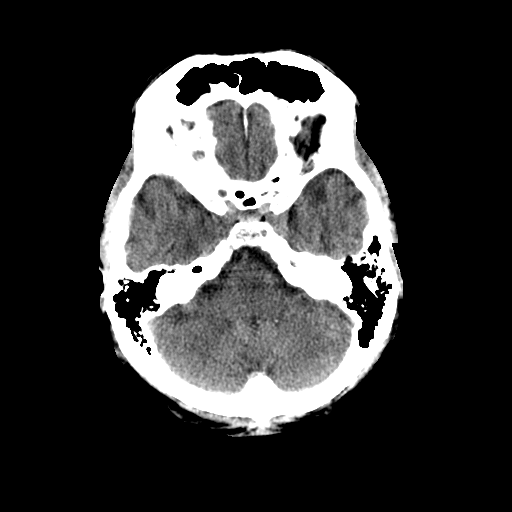
[im 8/28  brain]
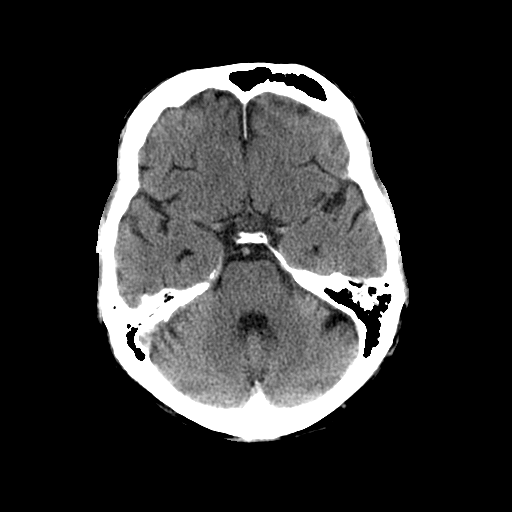
[im 10/28  brain]
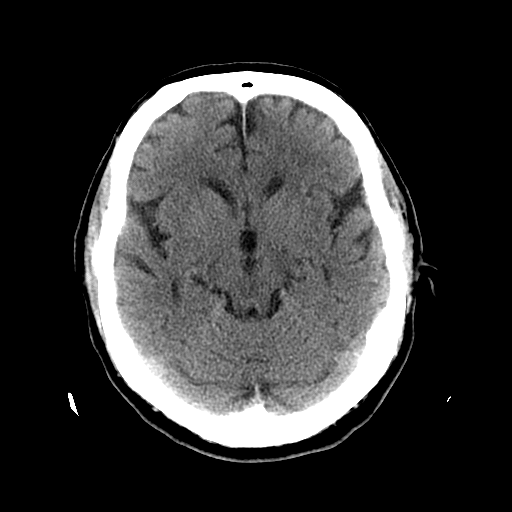
[im 10/28  bone]
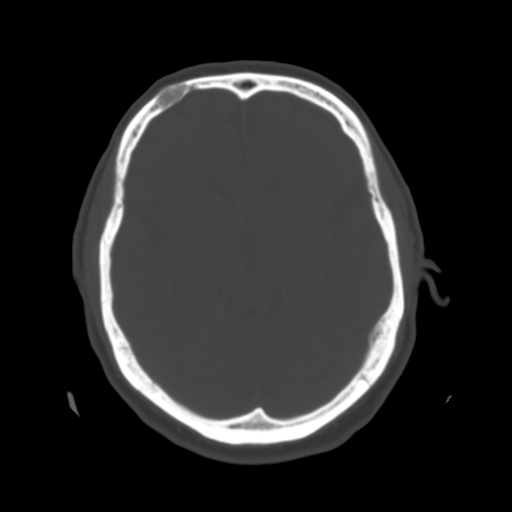
[im 12/28  brain]
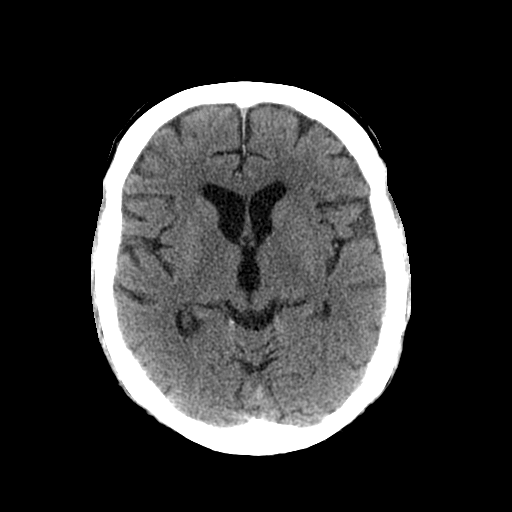
[im 14/28  brain]
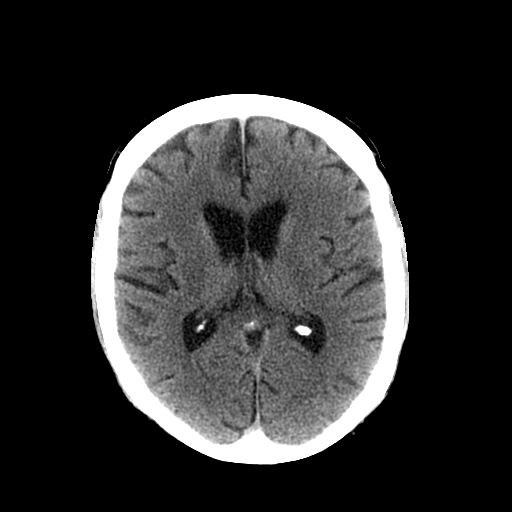
[im 16/28  brain]
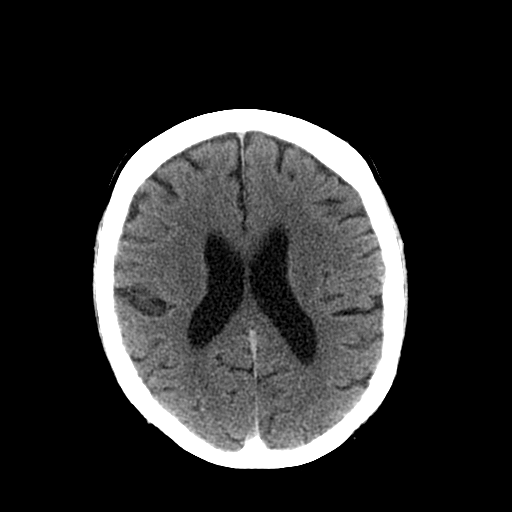
[im 18/28  brain]
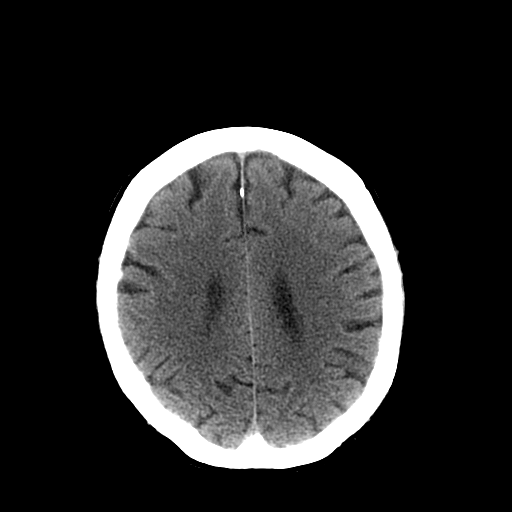
[im 18/28  bone]
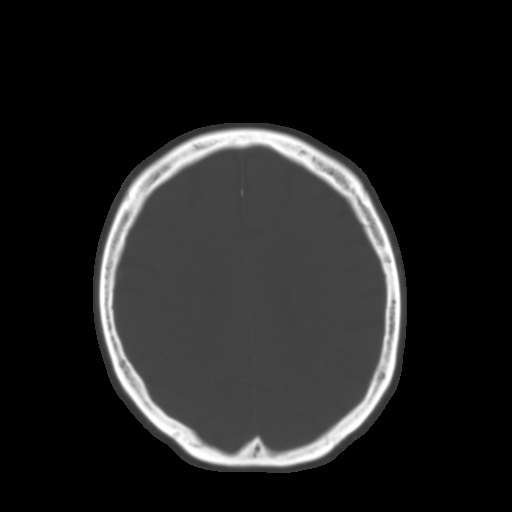
[im 20/28  brain]
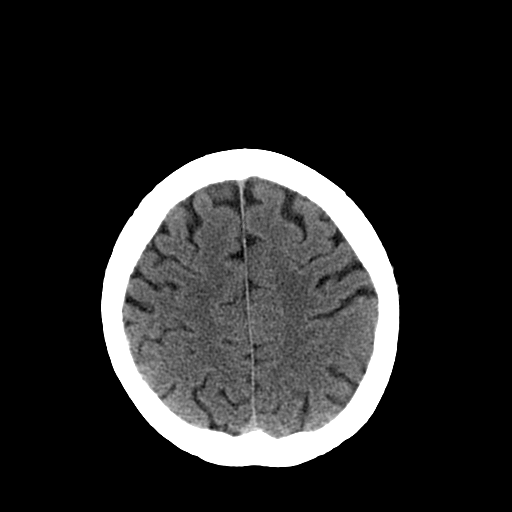
[im 22/28  brain]
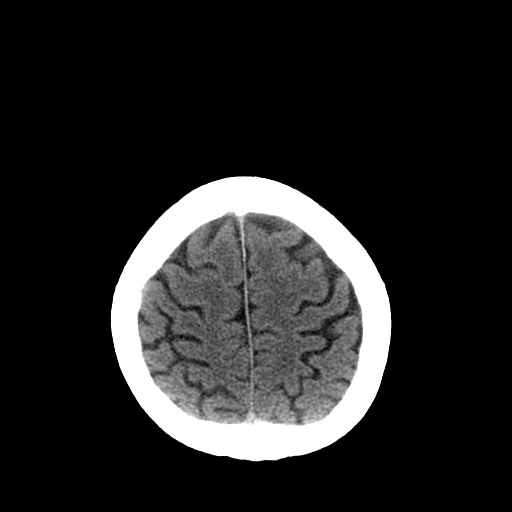
[im 24/28  brain]
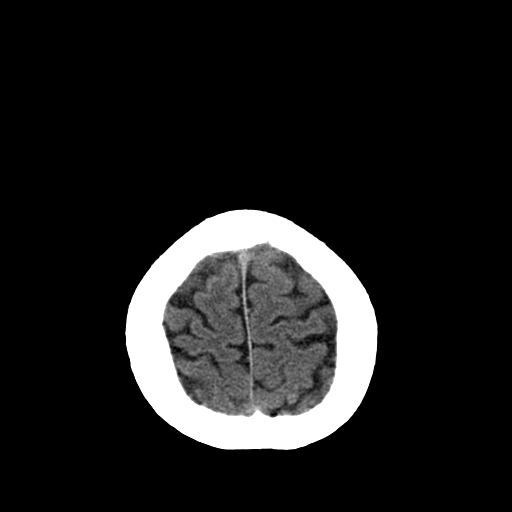
[im 26/28  brain]
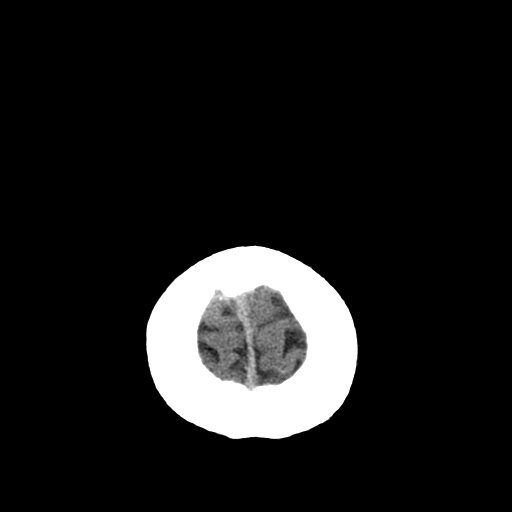
[im 26/28  bone]
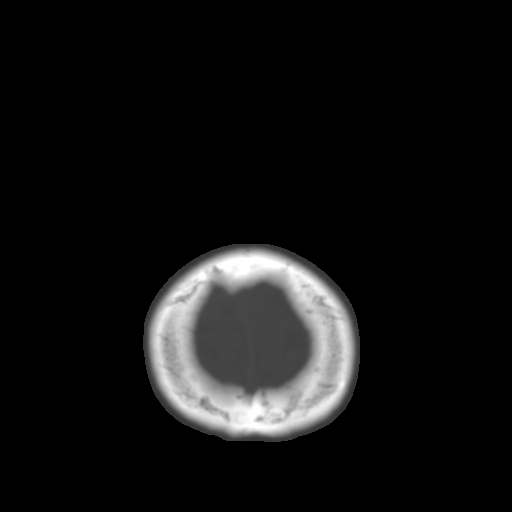

[Series 3: head bone · axial · 0.49mm/px · z∈[+31,+53]mm · 2 of 28 slices shown]
[im 2/28  bone]
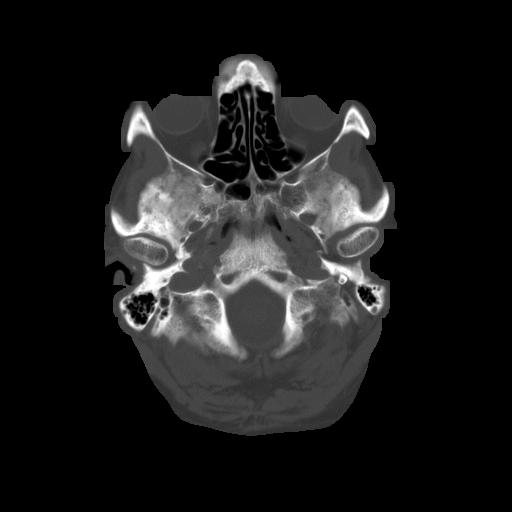
[im 6/28  bone]
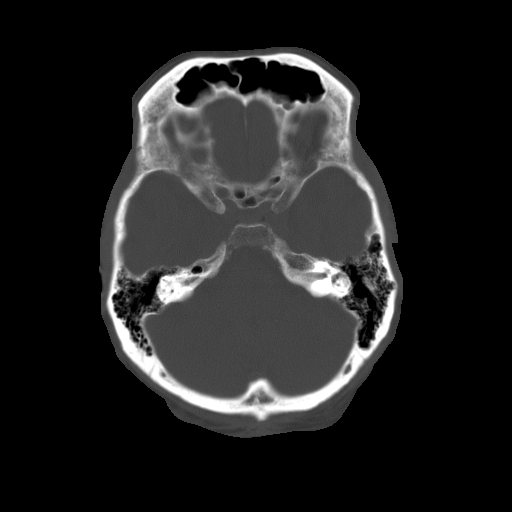

[15 of 30 positions shown; findings below may reference images not displayed]

FINDINGS: Mildly expansile lesion measuring 18 x 10 mm is noted in the right
frontal skull ; this may simply represent benign arachnoid
granulation, but MRI is recommended for further evaluation. Mild
diffuse cortical atrophy is noted. Mild chronic ischemic white
matter disease is noted. No mass effect or midline shift is noted.
Ventricular size is within normal limits. There is no evidence of
mass lesion, hemorrhage or acute infarction.
IMPRESSION: Mild diffuse cortical atrophy. Mild chronic ischemic white matter
disease. There is no evidence of acute infarction or hemorrhage.

18 x 10 mm expansile lesion is noted in right frontal skull ; this
may simply represent benign abnormality such as arachnoid
granulation, but MRI is recommended to rule out myeloma or
metastatic disease.

## 2015-03-24 ENCOUNTER — Other Ambulatory Visit: Payer: Self-pay | Admitting: Neurological Surgery

## 2015-03-24 DIAGNOSIS — M545 Low back pain: Secondary | ICD-10-CM

## 2015-03-30 ENCOUNTER — Ambulatory Visit
Admission: RE | Admit: 2015-03-30 | Discharge: 2015-03-30 | Disposition: A | Discharge status: Home / Self Care | Payer: Medicare Other | Source: Ambulatory Visit | Attending: Neurological Surgery | Admitting: Neurological Surgery

## 2015-03-30 DIAGNOSIS — M545 Low back pain: Secondary | ICD-10-CM

## 2015-04-08 ENCOUNTER — Other Ambulatory Visit: Payer: Self-pay | Admitting: Neurological Surgery

## 2015-04-20 ENCOUNTER — Encounter (HOSPITAL_COMMUNITY): Payer: Self-pay

## 2015-04-20 ENCOUNTER — Encounter (HOSPITAL_COMMUNITY)
Admission: RE | Admit: 2015-04-20 | Discharge: 2015-04-20 | Disposition: A | Discharge status: Home / Self Care | Payer: Medicare Other | Source: Ambulatory Visit | Attending: Neurological Surgery | Admitting: Neurological Surgery

## 2015-04-20 DIAGNOSIS — M549 Dorsalgia, unspecified: Secondary | ICD-10-CM | POA: Diagnosis not present

## 2015-04-20 DIAGNOSIS — M479 Spondylosis, unspecified: Secondary | ICD-10-CM | POA: Diagnosis not present

## 2015-04-20 DIAGNOSIS — M4806 Spinal stenosis, lumbar region: Secondary | ICD-10-CM

## 2015-04-20 DIAGNOSIS — Z0183 Encounter for blood typing: Secondary | ICD-10-CM

## 2015-04-20 DIAGNOSIS — Z01812 Encounter for preprocedural laboratory examination: Secondary | ICD-10-CM | POA: Insufficient documentation

## 2015-04-20 HISTORY — DX: Reserved for inherently not codable concepts without codable children: IMO0001

## 2015-04-20 HISTORY — DX: Personal history of other medical treatment: Z92.89

## 2015-04-20 LAB — CBC
HCT: 48.9 % (ref 39.0–52.0)
Hemoglobin: 16.6 g/dL (ref 13.0–17.0)
MCH: 29.1 pg (ref 26.0–34.0)
MCHC: 33.9 g/dL (ref 30.0–36.0)
MCV: 85.8 fL (ref 78.0–100.0)
Platelets: 182 10*3/uL (ref 150–400)
RBC: 5.7 MIL/uL (ref 4.22–5.81)
RDW: 14.3 % (ref 11.5–15.5)
WBC: 6.8 10*3/uL (ref 4.0–10.5)

## 2015-04-20 LAB — BASIC METABOLIC PANEL
Anion gap: 5 (ref 5–15)
BUN: 11 mg/dL (ref 6–20)
CO2: 29 mmol/L (ref 22–32)
Calcium: 10.2 mg/dL (ref 8.9–10.3)
Chloride: 103 mmol/L (ref 101–111)
Creatinine, Ser: 1.17 mg/dL (ref 0.61–1.24)
GFR calc Af Amer: >60 mL/min (ref >60)
GFR calc non Af Amer: 58 mL/min — ABNORMAL LOW (ref >60)
Glucose, Bld: 78 mg/dL (ref 65–99)
Potassium: 4.7 mmol/L (ref 3.5–5.1)
Sodium: 137 mmol/L (ref 135–145)

## 2015-04-20 LAB — TYPE AND SCREEN
ABO/RH(D): O POS
Antibody Screen: NEGATIVE

## 2015-04-20 LAB — SURGICAL PCR SCREEN
MRSA, PCR: NEGATIVE
Staphylococcus aureus: NEGATIVE

## 2015-04-20 NOTE — Pre-Procedure Instructions (Signed)
    AXL RODINO  04/20/2015        Your procedure is scheduled on Friday, July 15.  Report to [facility] Admitting at 08:30 A.M.  Call this number if you have problems the morning of surgery:[phone]  Call [phone] for any questions prior to surgery date.    Remember:  Do not eat food or drink liquids after midnight.Thursday night  Take these medicines the morning of surgery with A SIP OF WATER : Duloxetine(Cymbalta), tamsulosin(Flomax), can take pain medicine the morning of surgery, before coming to hosp.   Stop taking any medication that could thin your blood ( Fish oil, Aspirin, Goody powders or NSAIDS like ibuprofen, Advil, Aleve,) , any OTC dietary supplements or herbal supplements.   Do not wear jewelry.  Do not wear lotions, powders, or perfumes.  Do not wear deodorant.             Men may shave face and neck.  Do not bring valuables to the hospital.  [facility] is not responsible for any belongings or valuables.  Contacts, dentures or bridgework may not be worn into surgery.  Leave your suitcase in the car.  After surgery it may be brought to your room.  For patients admitted to the hospital, discharge time will be determined by your treatment team.       Special instructions:  Preparing for surgery: instructions re: CHG showers  Please read over the following fact sheets that you were given. Pain Booklet, Coughing and Deep Breathing, Blood Transfusion Information, MRSA Information and Surgical Site Infection Prevention

## 2015-04-22 MED ORDER — VANCOMYCIN HCL IN DEXTROSE 1-5 GM/200ML-% IV SOLN
1000.0000 mg | INTRAVENOUS | Status: AC
  Administered 2015-04-23: 1000 mg via INTRAVENOUS
  Filled 2015-04-22: qty 200

## 2015-04-23 ENCOUNTER — Encounter (HOSPITAL_COMMUNITY): Payer: Self-pay | Admitting: *Deleted

## 2015-04-23 ENCOUNTER — Inpatient Hospital Stay (HOSPITAL_COMMUNITY): Payer: Medicare Other

## 2015-04-23 ENCOUNTER — Encounter (HOSPITAL_COMMUNITY)
Admission: RE | Disposition: A | Discharge status: Home / Self Care | Payer: Self-pay | Source: Ambulatory Visit | Attending: Neurological Surgery

## 2015-04-23 ENCOUNTER — Inpatient Hospital Stay (HOSPITAL_COMMUNITY): Payer: Medicare Other | Admitting: Anesthesiology

## 2015-04-23 ENCOUNTER — Inpatient Hospital Stay (HOSPITAL_COMMUNITY)
Admission: RE | Admit: 2015-04-23 | Discharge: 2015-04-26 | DRG: 460 | Disposition: A | Discharge status: Home / Self Care | Payer: Medicare Other | Source: Ambulatory Visit | Attending: Neurological Surgery | Admitting: Neurological Surgery

## 2015-04-23 ENCOUNTER — Inpatient Hospital Stay (HOSPITAL_COMMUNITY): Payer: Medicare Other | Admitting: Vascular Surgery

## 2015-04-23 DIAGNOSIS — F419 Anxiety disorder, unspecified: Secondary | ICD-10-CM | POA: Diagnosis present

## 2015-04-23 DIAGNOSIS — Z880 Allergy status to penicillin: Secondary | ICD-10-CM | POA: Diagnosis not present

## 2015-04-23 DIAGNOSIS — M5416 Radiculopathy, lumbar region: Secondary | ICD-10-CM | POA: Diagnosis present

## 2015-04-23 DIAGNOSIS — Z79899 Other long term (current) drug therapy: Secondary | ICD-10-CM

## 2015-04-23 DIAGNOSIS — H409 Unspecified glaucoma: Secondary | ICD-10-CM | POA: Diagnosis present

## 2015-04-23 DIAGNOSIS — Z882 Allergy status to sulfonamides status: Secondary | ICD-10-CM

## 2015-04-23 DIAGNOSIS — N400 Enlarged prostate without lower urinary tract symptoms: Secondary | ICD-10-CM | POA: Diagnosis present

## 2015-04-23 DIAGNOSIS — K9589 Other complications of other bariatric procedure: Secondary | ICD-10-CM | POA: Diagnosis present

## 2015-04-23 DIAGNOSIS — K3189 Other diseases of stomach and duodenum: Secondary | ICD-10-CM | POA: Diagnosis present

## 2015-04-23 DIAGNOSIS — Z79891 Long term (current) use of opiate analgesic: Secondary | ICD-10-CM

## 2015-04-23 DIAGNOSIS — E785 Hyperlipidemia, unspecified: Secondary | ICD-10-CM | POA: Diagnosis present

## 2015-04-23 DIAGNOSIS — M479 Spondylosis, unspecified: Principal | ICD-10-CM | POA: Diagnosis present

## 2015-04-23 DIAGNOSIS — M4806 Spinal stenosis, lumbar region: Secondary | ICD-10-CM | POA: Diagnosis present

## 2015-04-23 DIAGNOSIS — F329 Major depressive disorder, single episode, unspecified: Secondary | ICD-10-CM | POA: Diagnosis present

## 2015-04-23 DIAGNOSIS — K9509 Other complications of gastric band procedure: Secondary | ICD-10-CM

## 2015-04-23 DIAGNOSIS — K219 Gastro-esophageal reflux disease without esophagitis: Secondary | ICD-10-CM | POA: Diagnosis present

## 2015-04-23 DIAGNOSIS — M549 Dorsalgia, unspecified: Secondary | ICD-10-CM | POA: Diagnosis present

## 2015-04-23 DIAGNOSIS — I10 Essential (primary) hypertension: Secondary | ICD-10-CM | POA: Diagnosis present

## 2015-04-23 DIAGNOSIS — Z87891 Personal history of nicotine dependence: Secondary | ICD-10-CM | POA: Diagnosis not present

## 2015-04-23 DIAGNOSIS — Z419 Encounter for procedure for purposes other than remedying health state, unspecified: Secondary | ICD-10-CM

## 2015-04-23 SURGERY — POSTERIOR LUMBAR FUSION 1 LEVEL
Anesthesia: General | Site: Back

## 2015-04-23 MED ORDER — PHENOL 1.4 % MT LIQD: 1.0000 | OROMUCOSAL | Status: DC | PRN

## 2015-04-23 MED ORDER — PROMETHAZINE HCL 25 MG/ML IJ SOLN: 6.2500 mg | INTRAMUSCULAR | Status: DC | PRN

## 2015-04-23 MED ORDER — VECURONIUM BROMIDE 10 MG IV SOLR
INTRAVENOUS | Status: AC
  Filled 2015-04-23: qty 10

## 2015-04-23 MED ORDER — LIDOCAINE HCL (CARDIAC) 20 MG/ML IV SOLN
INTRAVENOUS | Status: DC | PRN
  Administered 2015-04-23: 60 mg via INTRAVENOUS

## 2015-04-23 MED ORDER — FLEET ENEMA 7-19 GM/118ML RE ENEM: 1.0000 | ENEMA | Freq: Once | RECTAL | Status: AC | PRN

## 2015-04-23 MED ORDER — DEXAMETHASONE SODIUM PHOSPHATE 10 MG/ML IJ SOLN
INTRAMUSCULAR | Status: DC | PRN
  Administered 2015-04-23: 10 mg via INTRAVENOUS

## 2015-04-23 MED ORDER — KETOROLAC TROMETHAMINE 30 MG/ML IJ SOLN
INTRAMUSCULAR | Status: DC | PRN
  Administered 2015-04-23: 30 mg via INTRAVENOUS

## 2015-04-23 MED ORDER — OMEPRAZOLE-SODIUM BICARBONATE 20-1100 MG PO CAPS: 1.0000 | ORAL_CAPSULE | Freq: Every day | ORAL | Status: DC

## 2015-04-23 MED ORDER — ARTIFICIAL TEARS OP OINT
TOPICAL_OINTMENT | OPHTHALMIC | Status: AC
Start: 2015-04-23 — End: 2015-04-23
  Filled 2015-04-23: qty 3.5

## 2015-04-23 MED ORDER — 0.9 % SODIUM CHLORIDE (POUR BTL) OPTIME
TOPICAL | Status: DC | PRN
  Administered 2015-04-23: 1000 mL

## 2015-04-23 MED ORDER — LOSARTAN POTASSIUM 50 MG PO TABS
50.0000 mg | ORAL_TABLET | Freq: Every day | ORAL | Status: DC
  Administered 2015-04-24 – 2015-04-26 (×3): 50 mg via ORAL
  Filled 2015-04-23 (×3): qty 1

## 2015-04-23 MED ORDER — GLYCOPYRROLATE 0.2 MG/ML IJ SOLN
INTRAMUSCULAR | Status: DC | PRN
  Administered 2015-04-23: .6 mg via INTRAVENOUS

## 2015-04-23 MED ORDER — VECURONIUM BROMIDE 10 MG IV SOLR
INTRAVENOUS | Status: DC | PRN
  Administered 2015-04-23 (×3): 2 mg via INTRAVENOUS

## 2015-04-23 MED ORDER — OXYCODONE HCL 5 MG PO TABS: 5.0000 mg | ORAL_TABLET | Freq: Three times a day (TID) | ORAL | Status: DC | PRN

## 2015-04-23 MED ORDER — PROPOFOL 10 MG/ML IV BOLUS
INTRAVENOUS | Status: DC | PRN
  Administered 2015-04-23: 150 mg via INTRAVENOUS

## 2015-04-23 MED ORDER — OXYCODONE-ACETAMINOPHEN 5-325 MG PO TABS
1.0000 | ORAL_TABLET | ORAL | Status: DC | PRN
  Administered 2015-04-23 – 2015-04-26 (×14): 2 via ORAL
  Filled 2015-04-23 (×14): qty 2

## 2015-04-23 MED ORDER — SODIUM CHLORIDE 0.9 % IJ SOLN: 3.0000 mL | INTRAMUSCULAR | Status: DC | PRN

## 2015-04-23 MED ORDER — DULOXETINE HCL 60 MG PO CPEP
60.0000 mg | ORAL_CAPSULE | Freq: Every day | ORAL | Status: DC
  Administered 2015-04-24 – 2015-04-26 (×3): 60 mg via ORAL
  Filled 2015-04-23 (×3): qty 1

## 2015-04-23 MED ORDER — ACETAMINOPHEN 650 MG RE SUPP: 650.0000 mg | RECTAL | Status: DC | PRN

## 2015-04-23 MED ORDER — DEXAMETHASONE SODIUM PHOSPHATE 10 MG/ML IJ SOLN
INTRAMUSCULAR | Status: AC
  Filled 2015-04-23: qty 1

## 2015-04-23 MED ORDER — NEOSTIGMINE METHYLSULFATE 10 MG/10ML IV SOLN
INTRAVENOUS | Status: DC | PRN
Start: 2015-04-23 — End: 2015-04-23
  Administered 2015-04-23: 4 mg via INTRAVENOUS
  Administered 2015-04-23: .5 mg via INTRAVENOUS

## 2015-04-23 MED ORDER — FENTANYL CITRATE (PF) 250 MCG/5ML IJ SOLN
INTRAMUSCULAR | Status: AC
  Filled 2015-04-23: qty 5

## 2015-04-23 MED ORDER — MIDAZOLAM HCL 2 MG/2ML IJ SOLN
INTRAMUSCULAR | Status: DC | PRN
  Administered 2015-04-23: 2 mg via INTRAVENOUS

## 2015-04-23 MED ORDER — POLYETHYLENE GLYCOL 3350 17 G PO PACK
17.0000 g | PACK | Freq: Every day | ORAL | Status: DC | PRN
Start: 2015-04-23 — End: 2015-04-26

## 2015-04-23 MED ORDER — THROMBIN 5000 UNITS EX SOLR
OROMUCOSAL | Status: DC | PRN
  Administered 2015-04-23: 10 mL via TOPICAL

## 2015-04-23 MED ORDER — LACTATED RINGERS IV SOLN
INTRAVENOUS | Status: DC
  Administered 2015-04-23 (×3): via INTRAVENOUS

## 2015-04-23 MED ORDER — NEOSTIGMINE METHYLSULFATE 10 MG/10ML IV SOLN
INTRAVENOUS | Status: AC
  Filled 2015-04-23: qty 2

## 2015-04-23 MED ORDER — SODIUM CHLORIDE 0.9 % IR SOLN
Status: DC | PRN
  Administered 2015-04-23: 11:00:00

## 2015-04-23 MED ORDER — DIAZEPAM 5 MG PO TABS
5.0000 mg | ORAL_TABLET | Freq: Every day | ORAL | Status: DC
  Administered 2015-04-23 – 2015-04-25 (×3): 5 mg via ORAL
  Filled 2015-04-23 (×3): qty 1

## 2015-04-23 MED ORDER — FENTANYL CITRATE (PF) 250 MCG/5ML IJ SOLN
INTRAMUSCULAR | Status: DC | PRN
  Administered 2015-04-23: 100 ug via INTRAVENOUS
  Administered 2015-04-23: 10 ug via INTRAVENOUS
  Administered 2015-04-23 (×2): 100 ug via INTRAVENOUS
  Administered 2015-04-23: 150 ug via INTRAVENOUS

## 2015-04-23 MED ORDER — HYDROMORPHONE HCL 1 MG/ML IJ SOLN
0.2500 mg | INTRAMUSCULAR | Status: DC | PRN
  Administered 2015-04-23 (×4): 0.5 mg via INTRAVENOUS

## 2015-04-23 MED ORDER — ONDANSETRON HCL 4 MG/2ML IJ SOLN: 4.0000 mg | INTRAMUSCULAR | Status: DC | PRN

## 2015-04-23 MED ORDER — TAMSULOSIN HCL 0.4 MG PO CAPS
0.4000 mg | ORAL_CAPSULE | Freq: Every day | ORAL | Status: DC
  Administered 2015-04-23 – 2015-04-26 (×4): 0.4 mg via ORAL
  Filled 2015-04-23 (×4): qty 1

## 2015-04-23 MED ORDER — GLYCOPYRROLATE 0.2 MG/ML IJ SOLN
INTRAMUSCULAR | Status: AC
  Filled 2015-04-23: qty 3

## 2015-04-23 MED ORDER — SODIUM CHLORIDE 0.9 % IV SOLN
INTRAVENOUS | Status: DC
  Administered 2015-04-23: 18:00:00 via INTRAVENOUS

## 2015-04-23 MED ORDER — ONDANSETRON HCL 4 MG/2ML IJ SOLN
INTRAMUSCULAR | Status: AC
Start: 2015-04-23 — End: 2015-04-23
  Filled 2015-04-23: qty 2

## 2015-04-23 MED ORDER — SENNA 8.6 MG PO TABS
1.0000 | ORAL_TABLET | Freq: Two times a day (BID) | ORAL | Status: DC
  Administered 2015-04-23 – 2015-04-26 (×6): 8.6 mg via ORAL
  Filled 2015-04-23 (×7): qty 1

## 2015-04-23 MED ORDER — BUPIVACAINE HCL (PF) 0.5 % IJ SOLN
INTRAMUSCULAR | Status: DC | PRN
  Administered 2015-04-23: 10 mL
  Administered 2015-04-23: 20 mL

## 2015-04-23 MED ORDER — PROPOFOL 10 MG/ML IV BOLUS
INTRAVENOUS | Status: AC
  Filled 2015-04-23: qty 20

## 2015-04-23 MED ORDER — METHOCARBAMOL 500 MG PO TABS
500.0000 mg | ORAL_TABLET | Freq: Four times a day (QID) | ORAL | Status: DC | PRN
  Administered 2015-04-26: 500 mg via ORAL
  Filled 2015-04-23 (×2): qty 1

## 2015-04-23 MED ORDER — SURGIFOAM 100 EX MISC
CUTANEOUS | Status: DC | PRN
  Administered 2015-04-23: 11:00:00 via TOPICAL

## 2015-04-23 MED ORDER — OXYCODONE-ACETAMINOPHEN 10-325 MG PO TABS: 1.0000 | ORAL_TABLET | Freq: Three times a day (TID) | ORAL | Status: DC | PRN

## 2015-04-23 MED ORDER — LIDOCAINE-EPINEPHRINE 1 %-1:100000 IJ SOLN
INTRAMUSCULAR | Status: DC | PRN
  Administered 2015-04-23: 10 mL
  Administered 2015-04-23: 20 mL

## 2015-04-23 MED ORDER — POLYETHYLENE GLYCOL 3350 17 G PO PACK
17.0000 g | PACK | Freq: Every evening | ORAL | Status: DC
  Administered 2015-04-23 – 2015-04-24 (×2): 17 g via ORAL
  Filled 2015-04-23 (×2): qty 1

## 2015-04-23 MED ORDER — ONDANSETRON HCL 4 MG/2ML IJ SOLN
INTRAMUSCULAR | Status: DC | PRN
  Administered 2015-04-23: 4 mg via INTRAVENOUS

## 2015-04-23 MED ORDER — ROCURONIUM BROMIDE 50 MG/5ML IV SOLN
INTRAVENOUS | Status: AC
  Filled 2015-04-23: qty 1

## 2015-04-23 MED ORDER — HYDROMORPHONE HCL 1 MG/ML IJ SOLN
0.5000 mg | INTRAMUSCULAR | Status: DC | PRN
  Administered 2015-04-23: 1 mg via INTRAVENOUS
  Filled 2015-04-23: qty 1

## 2015-04-23 MED ORDER — DEXTROSE 5 % IV SOLN
500.0000 mg | Freq: Four times a day (QID) | INTRAVENOUS | Status: DC | PRN
  Administered 2015-04-23: 500 mg via INTRAVENOUS
  Filled 2015-04-23 (×3): qty 5

## 2015-04-23 MED ORDER — SODIUM CHLORIDE 0.9 % IJ SOLN
3.0000 mL | Freq: Two times a day (BID) | INTRAMUSCULAR | Status: DC
Start: 2015-04-23 — End: 2015-04-26
  Administered 2015-04-23 – 2015-04-26 (×6): 3 mL via INTRAVENOUS

## 2015-04-23 MED ORDER — ALUM & MAG HYDROXIDE-SIMETH 200-200-20 MG/5ML PO SUSP: 30.0000 mL | Freq: Four times a day (QID) | ORAL | Status: DC | PRN

## 2015-04-23 MED ORDER — ACETAMINOPHEN 325 MG PO TABS: 650.0000 mg | ORAL_TABLET | ORAL | Status: DC | PRN

## 2015-04-23 MED ORDER — TIMOLOL HEMIHYDRATE 0.5 % OP SOLN: 1.0000 [drp] | Freq: Two times a day (BID) | OPHTHALMIC | Status: DC

## 2015-04-23 MED ORDER — FENTANYL CITRATE (PF) 250 MCG/5ML IJ SOLN
INTRAMUSCULAR | Status: AC
Start: 2015-04-23 — End: 2015-04-23
  Filled 2015-04-23: qty 5

## 2015-04-23 MED ORDER — HYDROMORPHONE HCL 1 MG/ML IJ SOLN
INTRAMUSCULAR | Status: AC
  Filled 2015-04-23: qty 1

## 2015-04-23 MED ORDER — OXYCODONE-ACETAMINOPHEN 5-325 MG PO TABS: 1.0000 | ORAL_TABLET | Freq: Three times a day (TID) | ORAL | Status: DC | PRN

## 2015-04-23 MED ORDER — MENTHOL 3 MG MT LOZG: 1.0000 | LOZENGE | OROMUCOSAL | Status: DC | PRN

## 2015-04-23 MED ORDER — MIDAZOLAM HCL 2 MG/2ML IJ SOLN
INTRAMUSCULAR | Status: AC
  Filled 2015-04-23: qty 2

## 2015-04-23 MED ORDER — STERILE WATER FOR INJECTION IJ SOLN
INTRAMUSCULAR | Status: AC
  Filled 2015-04-23: qty 10

## 2015-04-23 MED ORDER — BISACODYL 10 MG RE SUPP: 10.0000 mg | Freq: Every day | RECTAL | Status: DC | PRN

## 2015-04-23 MED ORDER — KETOROLAC TROMETHAMINE 15 MG/ML IJ SOLN
15.0000 mg | Freq: Four times a day (QID) | INTRAMUSCULAR | Status: AC
  Administered 2015-04-23 – 2015-04-24 (×5): 15 mg via INTRAVENOUS
  Filled 2015-04-23 (×6): qty 1

## 2015-04-23 MED ORDER — KETOROLAC TROMETHAMINE 30 MG/ML IJ SOLN
INTRAMUSCULAR | Status: AC
  Filled 2015-04-23: qty 1

## 2015-04-23 MED ORDER — DOCUSATE SODIUM 100 MG PO CAPS
100.0000 mg | ORAL_CAPSULE | Freq: Two times a day (BID) | ORAL | Status: DC
  Administered 2015-04-23 – 2015-04-26 (×6): 100 mg via ORAL
  Filled 2015-04-23 (×6): qty 1

## 2015-04-23 MED ORDER — ROCURONIUM BROMIDE 100 MG/10ML IV SOLN
INTRAVENOUS | Status: DC | PRN
  Administered 2015-04-23: 50 mg via INTRAVENOUS

## 2015-04-23 MED ORDER — SODIUM CHLORIDE 0.9 % IV SOLN: 250.0000 mL | INTRAVENOUS | Status: DC

## 2015-04-23 MED FILL — Heparin Sodium (Porcine) Inj 1000 Unit/ML: INTRAMUSCULAR | Qty: 30 | Status: AC

## 2015-04-23 MED FILL — Sodium Chloride IV Soln 0.9%: INTRAVENOUS | Qty: 1000 | Status: AC

## 2015-04-23 SURGICAL SUPPLY — 63 items
ADH SKN CLS APL DERMABOND .7 (GAUZE/BANDAGES/DRESSINGS) ×1
BAG DECANTER FOR FLEXI CONT (MISCELLANEOUS) ×3 IMPLANT
BLADE CLIPPER SURG (BLADE) IMPLANT
BONE MATRIX OSTEOCEL PRO MED (Bone Implant) ×2 IMPLANT
BUR MATCHSTICK NEURO 3.0 LAGG (BURR) ×3 IMPLANT
CAGE COROENT MP 8X9X23M-8 SPIN (Cage) ×4 IMPLANT
CANISTER SUCT 3000ML PPV (MISCELLANEOUS) ×3 IMPLANT
CONT SPEC 4OZ CLIKSEAL STRL BL (MISCELLANEOUS) ×6 IMPLANT
COVER BACK TABLE 60X90IN (DRAPES) ×3 IMPLANT
DECANTER SPIKE VIAL GLASS SM (MISCELLANEOUS) ×3 IMPLANT
DERMABOND ADVANCED (GAUZE/BANDAGES/DRESSINGS) ×2
DERMABOND ADVANCED .7 DNX12 (GAUZE/BANDAGES/DRESSINGS) ×1 IMPLANT
DRAPE C-ARM 42X72 X-RAY (DRAPES) ×6 IMPLANT
DRAPE LAPAROTOMY 100X72X124 (DRAPES) ×3 IMPLANT
DRAPE POUCH INSTRU U-SHP 10X18 (DRAPES) ×3 IMPLANT
DRAPE PROXIMA HALF (DRAPES) IMPLANT
DURAPREP 26ML APPLICATOR (WOUND CARE) ×3 IMPLANT
ELECT REM PT RETURN 9FT ADLT (ELECTROSURGICAL) ×3
ELECTRODE REM PT RTRN 9FT ADLT (ELECTROSURGICAL) ×1 IMPLANT
GAUZE SPONGE 4X4 12PLY STRL (GAUZE/BANDAGES/DRESSINGS) ×3 IMPLANT
GAUZE SPONGE 4X4 16PLY XRAY LF (GAUZE/BANDAGES/DRESSINGS) IMPLANT
GLOVE BIO SURGEON STRL SZ8 (GLOVE) ×2 IMPLANT
GLOVE BIOGEL PI IND STRL 8.5 (GLOVE) ×2 IMPLANT
GLOVE BIOGEL PI INDICATOR 8.5 (GLOVE) ×4
GLOVE ECLIPSE 7.5 STRL STRAW (GLOVE) ×8 IMPLANT
GLOVE ECLIPSE 8.5 STRL (GLOVE) ×6 IMPLANT
GLOVE EXAM NITRILE LRG STRL (GLOVE) IMPLANT
GLOVE EXAM NITRILE MD LF STRL (GLOVE) IMPLANT
GLOVE EXAM NITRILE XL STR (GLOVE) IMPLANT
GLOVE EXAM NITRILE XS STR PU (GLOVE) IMPLANT
GLOVE INDICATOR 8.0 STRL GRN (GLOVE) ×6 IMPLANT
GLOVE INDICATOR 8.5 STRL (GLOVE) ×2 IMPLANT
GOWN STRL REUS W/ TWL LRG LVL3 (GOWN DISPOSABLE) IMPLANT
GOWN STRL REUS W/ TWL XL LVL3 (GOWN DISPOSABLE) IMPLANT
GOWN STRL REUS W/TWL 2XL LVL3 (GOWN DISPOSABLE) ×8 IMPLANT
GOWN STRL REUS W/TWL LRG LVL3 (GOWN DISPOSABLE)
GOWN STRL REUS W/TWL XL LVL3 (GOWN DISPOSABLE) ×6
HEMOSTAT POWDER KIT SURGIFOAM (HEMOSTASIS) IMPLANT
KIT BASIN OR (CUSTOM PROCEDURE TRAY) ×3 IMPLANT
KIT ROOM TURNOVER OR (KITS) ×3 IMPLANT
MILL MEDIUM DISP (BLADE) ×2 IMPLANT
NEEDLE HYPO 22GX1.5 SAFETY (NEEDLE) ×3 IMPLANT
NS IRRIG 1000ML POUR BTL (IV SOLUTION) ×3 IMPLANT
PACK LAMINECTOMY NEURO (CUSTOM PROCEDURE TRAY) ×3 IMPLANT
PAD ARMBOARD 7.5X6 YLW CONV (MISCELLANEOUS) ×9 IMPLANT
PATTIES SURGICAL .5 X1 (DISPOSABLE) ×3 IMPLANT
ROD RELINE LORDOTIC 5.5X45 (Rod) ×4 IMPLANT
SCREW LOCK RELINE 5.5 TULIP (Screw) ×8 IMPLANT
SCREW RELINE-O POLY 6.5X45 (Screw) ×8 IMPLANT
SPONGE LAP 4X18 X RAY DECT (DISPOSABLE) IMPLANT
SPONGE SURGIFOAM ABS GEL 100 (HEMOSTASIS) ×3 IMPLANT
SUT VIC AB 1 CT1 18XBRD ANBCTR (SUTURE) ×1 IMPLANT
SUT VIC AB 1 CT1 8-18 (SUTURE) ×3
SUT VIC AB 2-0 CP2 18 (SUTURE) ×3 IMPLANT
SUT VIC AB 3-0 SH 8-18 (SUTURE) ×3 IMPLANT
SYR 20ML ECCENTRIC (SYRINGE) ×3 IMPLANT
SYR 3ML LL SCALE MARK (SYRINGE) ×12 IMPLANT
TOWEL OR 17X24 6PK STRL BLUE (TOWEL DISPOSABLE) ×3 IMPLANT
TOWEL OR 17X26 10 PK STRL BLUE (TOWEL DISPOSABLE) ×3 IMPLANT
TRAP SPECIMEN MUCOUS 40CC (MISCELLANEOUS) ×3 IMPLANT
TRAY FOLEY CATH 16FRSI W/METER (SET/KITS/TRAYS/PACK) ×2 IMPLANT
TRAY FOLEY W/METER SILVER 14FR (SET/KITS/TRAYS/PACK) ×1 IMPLANT
WATER STERILE IRR 1000ML POUR (IV SOLUTION) ×3 IMPLANT

## 2015-04-23 NOTE — Anesthesia Postprocedure Evaluation (Signed)
  Anesthesia Post-op Note  Patient: Seth Schwartz  Procedure(s) Performed: Procedure(s) (LRB): Lumbar one-two Posterior lumbar interbody fusion (N/A)  Patient Location: PACU  Anesthesia Type: General  Level of Consciousness: awake and alert   Airway and Oxygen Therapy: Patient Spontanous Breathing  Post-op Pain: mild  Post-op Assessment: Post-op Vital signs reviewed, Patient's Cardiovascular Status Stable, Respiratory Function Stable, Patent Airway and No signs of Nausea or vomiting  Last Vitals:  Filed Vitals:   04/23/15 1517  BP: 123/65  Pulse: 85  Temp:   Resp: 12    Post-op Vital Signs: stable   Complications: No apparent anesthesia complications

## 2015-04-23 NOTE — H&P (Signed)
Seth Schwartz is an 79 y.o. male.   Chief Complaint: Back and bilateral leg pain HPI: Seth Schwartz is a 79 year old individual whom I treated over the past 20 years. [name] had degenerative changes in his lower lumbar spine. He has undergone fusion from L3-L5 then L2-L3 and L5-S1 he's had a pseudoarthrosis at the L5-S1 level which is recently heal unless he complained continues to complain of significant pain centralized back. Recent study demonstrates that he is developed degenerative listhesis at L1-L2 and now has a moderately high-grade stenosis at this level. [name] being admitted now to undergo surgical decompression and arthrodesis at L1-L2.  Past Medical History  Diagnosis Date  . Enlarged prostate     takes Uroxatral nightly  . Anxiety     takes Valium prn  . Depression     takes Luvox daily  . GERD (gastroesophageal reflux disease)     takes Omeprazole daily  . Hyperlipidemia     takes Cholestoff at night and Fish Oil bid  . Pneumonia     as a baby  . Weakness     left leg and both hands  . Chronic low back pain   . Skin spots-aging   . H/O hiatal hernia   . History of gastric ulcer 1967  . History of colon polyps   . Urinary frequency   . Urinary urgency   . History of kidney stones     lithotripsyx1  . Nocturia   . Glaucoma     uses eye drops daily  . Insomnia     takes Trazodone nightly  . Constipation     takes Benefiber and stool softener daily  . History of stress test 2010    told wnl, on treadmill  . Hypertension     takes Losartan - takes in a.m. , not referred to cardiac, had stress test 6-7 yrs. ago, told wnl  . Shortness of breath dyspnea     due to pain & being out of shape  . Arthritis     spinal DDD, stenosis  . Headache(784.0)     r/t cervical issues, occasionally has severe headache & takes NSAID & Benadryl    Past Surgical History  Procedure Laterality Date  . Hemorrhoid surgery  2009    x 2  . Trigger finger release Left 4-7yrs ago     middle finger  . Anterior cervical decomp/discectomy fusion N/A 12/17/2012    Procedure: ANTERIOR CERVICAL DECOMPRESSION/DISCECTOMY FUSION 2 LEVELS;  Surgeon: [name], MD;  Location: [facility];  Service: Neurosurgery;  Laterality: N/A;  Cervical three-four Anterior cervical decompression/diskectomy/fusion, with anterior revision of Cervical six-seven pseudoarthrosis  . Anterior cervical decomp/discectomy fusion  1998  . Colonoscopy    . Esophagogastroduodenoscopy  4/15    esophagus stretched  . Lithotripsy    . Anterior lumbar fusion N/A 04/01/2013    Procedure: ANTERIOR LUMBAR FUSION 1 LEVEL;  Surgeon: [name], MD;  Location: [place];  Service: Neurosurgery;  Laterality: N/A;  Lumbar five-Sacral One Anterior lumbar interbody fusion  . Anterior cervical decomp/discectomy fusion N/A 06/02/2014    Procedure: Cervical three-four, Cervical six-seven  Anterior Cervical Decompression/diskectomy/fusion with Iliac crest bone graft;  Surgeon: [name], MD;  Location: [facility];  Service: Neurosurgery;  Laterality: N/A;  . Back surgery  2000/2002/2007    3 lumbars, 1 cervical  with fusion  . Eye surgery Bilateral 2008    bilateral cataracts removed, 02/2015 & 6/ 2016,  glaucoma has  improved     History reviewed. No pertinent family history. Social History:  reports that he quit smoking about 35 years ago. His smoking use included Cigarettes. He has a 6.25 pack-year smoking history. He does not have any smokeless tobacco history on file. He reports that he does not drink alcohol or use illicit drugs.  Allergies:  Allergies  Allergen Reactions  . Sulfa Antibiotics Shortness Of Breath  . Penicillins Hives    Medications Prior to Admission  Medication Sig Dispense Refill  . Cholecalciferol (VITAMIN D) 2000 UNITS tablet Take 2,000 Units by mouth every evening.    . diazepam (VALIUM) 5 MG tablet Take 5 mg by mouth at bedtime.     . DULoxetine (CYMBALTA) 60 MG capsule Take 60  mg by mouth daily before breakfast.     . losartan (COZAAR) 50 MG tablet Take 50 mg by mouth daily after breakfast.     . Magnesium 400 MG TABS Take 400 mg by mouth daily.    . Misc Natural Products (PROSTATE HEALTH PO) Take 1 capsule by mouth daily with supper. TRUNATURE PROSTATE    . Multiple Vitamin (MULTIVITAMIN WITH MINERALS) TABS Take 1 tablet by mouth daily.    . Omega-3 Fatty Acids (FISH OIL EXTRA STRENGTH PO) Take 1,500 mg by mouth daily.    E[place]ney [place] Bicarbonate (ZEGERID PO) Take 1 capsule by mouth at bedtime. 10mg /1100mg     . oxyCODONE-acetaminophen (PERCOCET) 10-325 MG per tablet Take 1 tablet by mouth 3 (three) times daily as needed for pain.    . Plant Sterols and Stanols (CHOLEST OFF PO) Take 900 mg by mouth daily.    . polyethylene glycol (MIRALAX / GLYCOLAX) packet Take 17 g by mouth every evening.     . tamsulosin (FLOMAX) 0.4 MG CAPS capsule Take 0.4 mg by mouth daily.  5  . testosterone cypionate (DEPOTESTOTERONE CYPIONATE) 100 MG/ML injection Inject 50 mg into the muscle See admin instructions. For IM use only. Take every 10 days    . vitamin B-12 (CYANOCOBALAMIN) 1000 MCG tablet Take 1,000 mcg by mouth daily.    . timolol (BETIMOL) 0.5 % ophthalmic solution Place 1 drop into both eyes 2 (two) times daily.      No results found for this or any previous visit (from the past 48 hour(s)). No results found.  Review of Systems  Constitutional:       Complaints of chronic back pain  HENT: Negative.   Eyes: Negative.   Respiratory: Negative.   Cardiovascular: Negative.   Gastrointestinal: Negative.   Genitourinary: Negative.   Musculoskeletal: Positive for back pain.  Skin: Negative.   Neurological: Positive for weakness.       Generalized fatigue and walking with bilateral lower extremity modest weakness  Psychiatric/Behavioral: Negative.        Some episodes of clinical depression    Blood pressure 149/83, pulse 83, temperature 98.2 F (36.8 C), resp.  rate 18, weight 92.987 kg (205 lb), SpO2 97 %. Physical Exam  Constitutional: He appears well-developed and well-nourished.  HENT:  Head: Normocephalic and atraumatic.  Eyes: Conjunctivae are normal. Pupils are equal, round, and reactive to light.  Neck: Normal range of motion. Neck supple.  Cardiovascular: Normal rate and regular rhythm.   Respiratory: Effort normal and breath sounds normal.  GI: Soft. Bowel sounds are normal.  Musculoskeletal:  Long midline incision lower lumbar spine paraspinous muscle spasm and tenderness to palpation and percussion  Neurological:  Motor function is 4+5 in iliopsoas quadriceps  tibialis anterior and gastrocs. Deep tendon reflexes are absent in the patellae and Achilles upper extremity strength is within the limits of normal. Cranial nerve examination is normal. Station and gait is intact     Assessment/Plan Degenerative retrolisthesis L1-L2 with stenosis lumbar radiculopathy. Decompression and fusion L1-L2.  Imagene Boss J 04/23/2015, 11:33 AM

## 2015-04-23 NOTE — Progress Notes (Signed)
Orthopedic Tech Progress Note Patient Details:  FIDEL CAGGIANO September 11, 1936 [id number] Brace order giving to bio-tech vendor. Patient ID: Beverly Gust, male   DOB: [birth year], 79 y.o.   MRN: [id number]   Braulio Bosch 04/23/2015, 4:58 PM

## 2015-04-23 NOTE — Progress Notes (Signed)
Orthopedic Tech Progress Note Patient Details:  Seth Schwartz 1935/12/18 [id number] Patient already has brace. Patient ID: Beverly Gust, male   DOB: [birth year], 79 y.o.   MRN: [id number]   Braulio Bosch 04/23/2015, 10:08 PM

## 2015-04-23 NOTE — Progress Notes (Signed)
Patient arrived to unit from PACU. He was able to walk from stretcher to bed with minimal assistance. Patient appears in no distress. Safety precautions and orders reviewed with patient.  Will continue to monitor.   Ave Filter, RN

## 2015-04-23 NOTE — Progress Notes (Signed)
Patient ID: Seth Schwartz, male   DOB: [birth year], 79 y.o.   MRN: [id number] Vital signs are stable Motor function is intact in lower extremities Dressing is clean dry Legs feel comfortable Stable, okay to mobilize

## 2015-04-23 NOTE — Anesthesia Preprocedure Evaluation (Signed)
Anesthesia Evaluation  Patient identified by MRN, date of birth, ID band Patient awake    Reviewed: Allergy & Precautions, NPO status , Patient's Chart, lab work & pertinent test results  Airway Mallampati: II  TM Distance: >3 FB Neck ROM: Full    Dental  (+) Edentulous Upper   Pulmonary neg pulmonary ROS, former smoker,  breath sounds clear to auscultation  Pulmonary exam normal       Cardiovascular hypertension, Pt. on medications Normal cardiovascular examRhythm:Regular Rate:Normal     Neuro/Psych Anxiety negative neurological ROS     GI/Hepatic negative GI ROS, Neg liver ROS, GERD-  ,  Endo/Other  negative endocrine ROS  Renal/GU negative Renal ROS  negative genitourinary   Musculoskeletal negative musculoskeletal ROS (+)   Abdominal   Peds negative pediatric ROS (+)  Hematology negative hematology ROS (+)   Anesthesia Other Findings   Reproductive/Obstetrics negative OB ROS                             Anesthesia Physical Anesthesia Plan  ASA: III  Anesthesia Plan: General   Post-op Pain Management:    Induction: Intravenous  Airway Management Planned: Oral ETT  Additional Equipment:   Intra-op Plan:   Post-operative Plan: Extubation in OR  Informed Consent: I have reviewed the patients History and Physical, chart, labs and discussed the procedure including the risks, benefits and alternatives for the proposed anesthesia with the patient or authorized representative who has indicated his/her understanding and acceptance.   Dental advisory given  Plan Discussed with: CRNA and Surgeon  Anesthesia Plan Comments:         Anesthesia Quick Evaluation

## 2015-04-23 NOTE — Anesthesia Procedure Notes (Signed)
Procedure Name: Intubation Performed by: Tamala Fothergill S Intubation Type: IV induction Ventilation: Oral airway inserted - appropriate to patient size and Mask ventilation without difficulty Laryngoscope Size: Mac and 4 Grade View: Grade II Tube type: Oral Tube size: 7.5 mm Number of attempts: 1 Tube secured with: Tape Dental Injury: Teeth and Oropharynx as per pre-operative assessment

## 2015-04-23 NOTE — Transfer of Care (Signed)
Immediate Anesthesia Transfer of Care Note  Patient: Seth Schwartz  Procedure(s) Performed: Procedure(s): Lumbar one-two Posterior lumbar interbody fusion (N/A)  Patient Location: PACU  Anesthesia Type:General  Level of Consciousness: awake, alert  and oriented  Airway & Oxygen Therapy: Patient Spontanous Breathing and Patient connected to nasal cannula oxygen  Post-op Assessment: Report given to RN and Post -op Vital signs reviewed and stable  Post vital signs: Reviewed and stable  Last Vitals:  Filed Vitals:   04/23/15 0743  BP:   Pulse:   Temp: 36.8 C  Resp:     Complications: No apparent anesthesia complications

## 2015-04-23 NOTE — Op Note (Signed)
Date of surgery: 04/23/2015 Preoperative diagnosis: Lumbar spinal stenosis L1-L2 with neurogenic claudication and back pain, retrolisthesis L1-L2 Postoperative diagnosis: Lumbar spinal stenosis L1-L2 with neurogenic claudication and back pain, retrolisthesis L1-L2, status post arthrodesis L2 to the sacrum Procedure: Decompression L1-L2 with decompression of individual nerve roots at L1-L2 secondary to central and lateral recess stenosis requiring more work than decompression for simple interbody technique. removal of hardware from L2-L3 posterior lumbar interbody arthrodesis L1-L2 pedicle screw fixation L1-L2 posterior lateral arthrodesis L1-L2 using local autograft and allograft. Surgeon: [name] M.D. First assistant: Francesca Jewett M.D. Anesthesia: Gen. endotracheal Indications: Seth Schwartz is a 79 year old individual who's had significant back and bilateral lower extremity pain. [name] had previous decompression and fusion done in number of stages from L2 down to the sacrum. [name] been advised regarding the need for surgery to decompress and stabilize L1-L2 secondary to significant stenosis and advanced spondylosis.  Procedure: The patient was brought to the operating room supine on a stretcher. After the smooth induction of general endotracheal anesthesia, he was turned prone. The back was prepped with alcohol and DuraPrep and draped in a sterile fashion. Midline incision was reopened at the superior border of his previously made incision. Dissection was carried down to the lumbar dorsal fascia and by dissecting in a subperiosteal fashion the top of the old hardware was identified at L2. The dissection was then carried inferiorly around the hardware to expose the pedicle screws at L2 and L3 Neurontin between them. This dissection was done bilaterally. The screw heads were then removed the rod was removed and the individual screws were removed. Attention was then turned to L1-L2 where the laminar arch of  L1 was identified out to the pars region and the facet complex at T12-L1 was uncovered. Transverse process of L1 was decorticated and packed off for later use in grafting. Laminotomy was then created bilaterally removing the inferior margin lamina out to the entirety of the facet at the L1-L2 space. The ligament was taken up in this region. Common dural tube was identified. A 2 and 3 and a 4 mm Kerrison punch was then used to undercut all the ligamentous material. Once this was decompressed the common dural tube was explored and superiorly the L1 nerve root was decompressed inferiorly the L2 nerve root was decompressed. Common dural tube was then gently retracted medially and the underlying disc was noted to be bulging dorsally. This was then incised and a combination of curettes and rongeurs was used to remove severely degenerated and desiccated disc material from within this disc space. A series of disc space cleaners was then used to loosen and remove the endplate material at the disc space and the disc space was evacuated first on the right side and then on the left side area ultimately an interbody spacer measuring 8 mm in height could be placed into the interspace. This allowed for easier working conditions on the opposite side. Once the disc was completely evacuated of its material an 8 mm tall 23 mm long 8 lordotic spacer was chosen as the best way to distract and maintain neutral orientation of the L1-L2 interspace. The interspace was then packed with a combination of autograft and allograft the allograft being osseous cell material. 6 mL of autograft was initially packed into the interspace then spacers were placed and additional 3 mL of autograft was packed into the interspace. L1 pedicle entry sites were then chosen using fluoroscopic guidance and 6.5 x 45 mm pedicle screws were placed and  L1. The previous holes from L2 were used and 6.5 x 45 mm invasive screws were placed there. 40 mm precontoured rods  were then used to connect the rod heads together. Lateral gutters which had been previously packed away were then packed with remainder of autograft and allograft for total of 9 mL of that material. The system was connected in a neutral construct and fluoroscopic guidance revealed that there was some slight bit of lordosis consistent with the findings on his preoperative films. With this hemostasis was achieved in the soft tissues carefully and then the lumbar dorsal fascia was closed with #1  interrupted fashion 2 over was used in the subcutaneous tissues 30 Vicryls to close the subcuticular skin. Blood loss is estimated at 150 mL. Patient tolerated the procedure was returned to recovery room in stable condition.

## 2015-04-24 NOTE — Progress Notes (Signed)
Subjective: Patient reports November well no leg pain back is sore not voiding yet as it just removed his catheter.  Objective: Vital signs in last 24 hours: Temp:  [97.7 F (36.5 C)-98.7 F (37.1 C)] 98.2 F (36.8 C) (07/16 0505) Pulse Rate:  [76-97] 89 (07/16 0505) Resp:  [9-20] 16 (07/16 0505) BP: (89-158)/(51-76) 128/66 mmHg (07/16 0505) SpO2:  [93 %-100 %] 98 % (07/16 0505) Weight:  [95.4 kg (210 lb 5.1 oz)] 95.4 kg (210 lb 5.1 oz) (07/15 1623)  Intake/Output from previous day: 07/15 0701 - 07/16 0700 In: 2243 [P.O.:240; I.V.:2003] Out: 2560 [Urine:2310; Blood:250] Intake/Output this shift:    strength out of 5 wound clean dry and intact  Lab Results: No results for input(s): WBC, HGB, HCT, PLT in the last 72 hours. BMET No results for input(s): NA, K, CL, CO2, GLUCOSE, BUN, CREATININE, CALCIUM in the last 72 hours.  Studies/Results: Dg Lumbar Spine 2-3 Views  04/23/2015   CLINICAL DATA:  Patient status post fusion with lumbar stenosis.  EXAM: LUMBAR SPINE - 2-3 VIEW; DG C-ARM 61-120 MIN  COMPARISON:  Radiograph 04/01/2013  FINDINGS: Single fluoroscopic intraoperative image demonstrates lumbar spinal fusion hardware. Interbody spacer is demonstrated.  IMPRESSION: Patient status post lumbar spinal fusion with interbody spacer.   Electronically Signed   By: Lovey Newcomer M.D.   On: 04/23/2015 14:41   Dg C-arm 1-60 Min  04/23/2015   CLINICAL DATA:  Patient status post fusion with lumbar stenosis.  EXAM: LUMBAR SPINE - 2-3 VIEW; DG C-ARM 61-120 MIN  COMPARISON:  Radiograph 04/01/2013  FINDINGS: Single fluoroscopic intraoperative image demonstrates lumbar spinal fusion hardware. Interbody spacer is demonstrated.  IMPRESSION: Patient status post lumbar spinal fusion with interbody spacer.   Electronically Signed   By: Lovey Newcomer M.D.   On: 04/23/2015 14:41    Assessment/Plan: Continue mobilizes today with physical and occupational therapy possible discharge tomorrow if ambulating  and voiding spontaneously and tolerating regular diet  LOS: 1 day     Sultana Tierney P 04/24/2015, 8:49 AM

## 2015-04-24 NOTE — Progress Notes (Signed)
Patients feet and ankles are swelling and the patient and his wife are concerned it is non-pitting and most visible on the top of his feet and ankles bilaterally. Patient says he has never had this issue before will notify attending.

## 2015-04-24 NOTE — Evaluation (Signed)
Physical Therapy Evaluation Patient Details Name: Seth Schwartz MRN: [id number] DOB: [birth year] Today's Date: 04/24/2015   History of Present Illness  Patient is a 79 yo male with extensive hx of previous spinal surgeries who presents now s/p Lumbar one-two Posterior lumbar interbody fusion.  Clinical Impression  Patient seen for evaluation and education post operatively. Patient mobilizing well, ambulating without device, performed stair negotiation (full flight) without difficulty. Anticipate patient will progress well, encouraged continued mobility throughout hospital course. Education provided. Patient has had previous back surgeries and reports familiarity with education. No further acute PT needs. Will sign off.    Follow Up Recommendations No PT follow up    Equipment Recommendations  None recommended by PT    Recommendations for Other Services       Precautions / Restrictions Precautions Precautions: Back Precaution Booklet Issued: Yes (comment) Precaution Comments: verbally reviewed with patient Required Braces or Orthoses: Spinal Brace Spinal Brace: Lumbar corset Restrictions Weight Bearing Restrictions: No      Mobility  Bed Mobility Overal bed mobility: Needs Assistance Bed Mobility: Rolling;Sidelying to Sit Rolling: Supervision Sidelying to sit: Supervision       General bed mobility comments: VCs to reinforce technique  Transfers Overall transfer level: Modified independent Equipment used: None             General transfer comment: increased time to perform initially  Ambulation/Gait Ambulation/Gait assistance: Independent Ambulation Distance (Feet): 380 Feet Assistive device: None Gait Pattern/deviations: Step-through pattern Gait velocity: initially decreased but improved with distance   General Gait Details: steady with ambulation, no device, slow cadence intiially  Stairs Stairs: Yes Stairs assistance: Supervision Stair Management:  One rail Left;Step to pattern;Forwards Number of Stairs: 12 General stair comments: no physical assist required  Wheelchair Mobility    Modified Rankin (Stroke Patients Only)       Balance Overall balance assessment: No apparent balance deficits (not formally assessed)                                           Pertinent Vitals/Pain      Home Living Family/patient expects to be discharged to:: Private residence Living Arrangements: Spouse/significant other Available Help at Discharge: Family;Available 24 hours/day Type of Home: House Home Access: Stairs to enter Entrance Stairs-Rails: None Entrance Stairs-Number of Steps: 2 Home Layout: Two level;Bed/bath upstairs Home Equipment: Walker - 2 wheels;Shower seat;Bedside commode      Prior Function Level of Independence: Independent               Hand Dominance   Dominant Hand: Right    Extremity/Trunk Assessment   Upper Extremity Assessment: Overall WFL for tasks assessed           Lower Extremity Assessment: Overall WFL for tasks assessed         Communication   Communication: No difficulties  Cognition Arousal/Alertness: Awake/alert Behavior During Therapy: WFL for tasks assessed/performed Overall Cognitive Status: Within Functional Limits for tasks assessed                      General Comments      Exercises        Assessment/Plan    PT Assessment Patent does not need any further PT services  PT Diagnosis Difficulty walking;Acute pain   PT Problem List    PT Treatment Interventions     PT  Goals (Current goals can be found in the Care Plan section) Acute Rehab PT Goals Patient Stated Goal: o go home PT Goal Formulation: All assessment and education complete, DC therapy    Frequency     Barriers to discharge        Co-evaluation               End of Session Equipment Utilized During Treatment: Back brace Activity Tolerance: Patient tolerated  treatment well;No increased pain Patient left: in chair;with call bell/phone within reach;with family/visitor present Nurse Communication: Mobility status         Time: 1657-9038 PT Time Calculation (min) (ACUTE ONLY): 20 min   Charges:   PT Evaluation $Initial PT Evaluation Tier I: 1 Procedure     PT G CodesDuncan Dull May 07, 2015, 9:14 AM Alben Deeds, PT DPT  [phone]

## 2015-04-24 NOTE — Evaluation (Signed)
Occupational Therapy Evaluation and Discharge Patient Details Name: Seth Schwartz MRN: [id number] DOB: 20-Oct-1935 Today's Date: 04/24/2015    History of Present Illness Patient is a 78 yo male with extensive hx of previous spinal surgeries who presents now s/p Lumbar one-two Posterior lumbar interbody fusion.   Clinical Impression   This 79 yo male admitted and underwent above presents to acute OT with all education completed. We will D/C from acute OT.    Follow Up Recommendations  No OT follow up    Equipment Recommendations  None recommended by OT       Precautions / Restrictions Precautions Precautions: Back Precaution Booklet Issued: Yes (comment) Precaution Comments: verbally reviewed with patient Required Braces or Orthoses: Spinal Brace Spinal Brace: Lumbar corset Restrictions Weight Bearing Restrictions: No      Mobility Bed Mobility Overal bed mobility: Needs Assistance Bed Mobility: Rolling;Sidelying to Sit Rolling: Supervision Sidelying to sit: Supervision       General bed mobility comments: VCs to reinforce technique  Transfers Overall transfer level: Modified independent Equipment used: None             General transfer comment: increased time to perform initially    Balance Overall balance assessment: No apparent balance deficits (not formally assessed)                                          ADL Overall ADL's : Modified independent                                       General ADL Comments: Recommended to wife that pt use wet wipes for back peri-hygiene to A easier with cleaning and avoid twisting. Also recommended that pt use a spit cup when brushing teeth so as to avoid bending over the sink     Vision Additional Comments: No change from baseline          Pertinent Vitals/Pain Pain Assessment: No/denies pain     Hand Dominance Right   Extremity/Trunk Assessment Upper Extremity  Assessment Upper Extremity Assessment: Overall WFL for tasks assessed   Lower Extremity Assessment Lower Extremity Assessment: Overall WFL for tasks assessed       Communication Communication Communication: No difficulties   Cognition Arousal/Alertness: Awake/alert Behavior During Therapy: WFL for tasks assessed/performed Overall Cognitive Status: Within Functional Limits for tasks assessed                                Home Living Family/patient expects to be discharged to:: Private residence Living Arrangements: Spouse/significant other Available Help at Discharge: Family;Available 24 hours/day Type of Home: House Home Access: Stairs to enter [employer] of Steps: 2 Entrance Stairs-Rails: None Home Layout: Two level;Bed/bath upstairs Alternate Level Stairs-Number of Steps: 12 Alternate Level Stairs-Rails: Left Bathroom Shower/Tub: Occupational psychologist: Standard Bathroom Accessibility: Yes   Home Equipment: Environmental consultant - 2 wheels;Shower seat;Bedside commode          Prior Functioning/Environment Level of Independence: Independent             OT Diagnosis: Generalized weakness         OT Goals(Current goals can be found in the care plan section) Acute Rehab OT Goals Patient Stated Goal:  to go home  OT Frequency:             Co-evaluation PT/OT/SLP Co-Evaluation/Treatment: Yes (partial) Reason for Co-Treatment: For patient/therapist safety   OT goals addressed during session: ADL's and self-care;Strengthening/ROM      End of Session Equipment Utilized During Treatment: Back brace Nurse Communication: Mobility status (ready to sign off on pt to be up and about by himself)  Activity Tolerance: Patient tolerated treatment well Patient left:  (walking in hallway with PT)   Time: 6203-5597 OT Time Calculation (min): 11 min Charges:  OT General Charges $OT Visit: 1 Procedure OT Evaluation $Initial OT Evaluation Tier I:  1 Procedure  Almon Register [phone] 04/24/2015, 11:07 AM

## 2015-04-25 NOTE — Care Management Note (Addendum)
Case Management Note  Patient Details  Name: Seth Schwartz MRN: [id number] Date of Birth: 08-18-1936  Subjective/Objective:                  Decompression L1-L2 with decompression of individual nerve roots at L1-L2 secondary to central and lateral recess stenosis requiring more work than decompression for simple interbody technique. removal of hardware from L2-L3 posterior lumbar interbody arthrodesis L1-L2 pedicle screw fixation L1-L2 posterior lateral arthrodesis L1-L2 using local autograft and allograft  Action/Plan: None  Expected Discharge Date:   04/26/15               Expected Discharge Plan:     In-House Referral:     Discharge planning Services     Post Acute Care Choice:    Choice offered to:     DME Arranged:    DME Agency:     HH Arranged:    [place] Agency:     Status of Service:     Medicare Important Message Given:    Date Medicare IM Given:    Medicare IM give by:    Date Additional Medicare IM Given:    Additional Medicare Important Message give by:     If discussed at [place] of Stay Meetings, dates discussed:    Additional Comments: Per Physical Therapy note patient will not require any Physical Therapy needs at time of discharge, no equipment recommendations made.    Corine Shelter, RN 04/25/2015, 10:59 AM   1325 04/25/15 Spoke to nurse no needs pertaining to dressing changes.  Care Management signing off , no HH needs a this time.  LJA RN

## 2015-04-25 NOTE — Progress Notes (Signed)
Subjective: Patient reports Doing okay still condition of back pain no leg pain  Objective: Vital signs in last 24 hours: Temp:  [97.8 F (36.6 C)-98.8 F (37.1 C)] 97.8 F (36.6 C) (07/17 0522) Pulse Rate:  [70-94] 70 (07/17 0522) Resp:  [16-20] 20 (07/17 0522) BP: (103-142)/(61-79) 142/79 mmHg (07/17 0522) SpO2:  [92 %-98 %] 98 % (07/17 0522)  Intake/Output from previous day: 07/16 0701 - 07/17 0700 In: 43 [I.V.:43] Out: -  Intake/Output this shift:    Neurologically intact wound clean dry and intact  Lab Results: No results for input(s): WBC, HGB, HCT, PLT in the last 72 hours. BMET No results for input(s): NA, K, CL, CO2, GLUCOSE, BUN, CREATININE, CALCIUM in the last 72 hours.  Studies/Results: Dg Lumbar Spine 2-3 Views  04/23/2015   CLINICAL DATA:  Patient status post fusion with lumbar stenosis.  EXAM: LUMBAR SPINE - 2-3 VIEW; DG C-ARM 61-120 MIN  COMPARISON:  Radiograph 04/01/2013  FINDINGS: Single fluoroscopic intraoperative image demonstrates lumbar spinal fusion hardware. Interbody spacer is demonstrated.  IMPRESSION: Patient status post lumbar spinal fusion with interbody spacer.   Electronically Signed   By: Lovey Newcomer M.D.   On: 04/23/2015 14:41   Dg C-arm 1-60 Min  04/23/2015   CLINICAL DATA:  Patient status post fusion with lumbar stenosis.  EXAM: LUMBAR SPINE - 2-3 VIEW; DG C-ARM 61-120 MIN  COMPARISON:  Radiograph 04/01/2013  FINDINGS: Single fluoroscopic intraoperative image demonstrates lumbar spinal fusion hardware. Interbody spacer is demonstrated.  IMPRESSION: Patient status post lumbar spinal fusion with interbody spacer.   Electronically Signed   By: Lovey Newcomer M.D.   On: 04/23/2015 14:41    Assessment/Plan: Continue to mobilize physical outpatient therapy patient states he is not that the pain under well enough controlled to be discharged today we'll continue to work on pain management and mobilization today  LOS: 2 days     Kahlil Cowans P 04/25/2015,  8:58 AM

## 2015-04-25 NOTE — Social Work (Signed)
CSW consulted for SNF placement. Patient has no Skilled Needs.  CSW signing off.  Christene Lye MSW, [place]

## 2015-04-26 MED ORDER — OXYCODONE-ACETAMINOPHEN 10-325 MG PO TABS: 1.0000 | ORAL_TABLET | ORAL | Status: DC | PRN

## 2015-04-26 MED ORDER — NABUMETONE 500 MG PO TABS: 500.0000 mg | ORAL_TABLET | Freq: Every day | ORAL | Status: DC

## 2015-04-26 MED ORDER — DIAZEPAM 5 MG PO TABS: 5.0000 mg | ORAL_TABLET | Freq: Four times a day (QID) | ORAL | Status: DC | PRN

## 2015-04-26 NOTE — Care Management Important Message (Signed)
Important Message  Patient Details  Name: Seth Schwartz MRN: [id number] Date of Birth: [birth year]   Medicare Important Message Given:  Yes-second notification given    Delorse Lek 04/26/2015, 1:39 PM

## 2015-04-26 NOTE — Discharge Summary (Signed)
Physician Discharge Summary  Patient ID: Seth Schwartz MRN: [id number] DOB/AGE: 1936-05-27 79 y.o.  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 He tolerated the surgery well.  Consults: None  Significant Diagnostic Studies: None  Treatments: surgery: Decompression L1-L2 with posterior lumbar interbody arthrodesis. [place] spacers. Lumbar fixation L1-L2. Removal of hardware L2-L3.  Discharge Exam: Blood pressure 133/80, pulse 70, temperature 98.1 F (36.7 C), temperature source Oral, resp. rate 18, height 5\' 10"  (1.778 m), weight 95.4 kg (210 lb 5.1 oz), SpO2 98 %. Decision is clean and dry, motor function is intact in lower extremities. Station and gait is intact.  Disposition: 01-Home or Self Care  Discharge Instructions    Call MD for:  redness, tenderness, or signs of infection (pain, swelling, redness, odor or green/yellow discharge around incision site)    Complete by:  As directed      Call MD for:  severe uncontrolled pain    Complete by:  As directed      Call MD for:  temperature >100.4    Complete by:  As directed      Diet - low sodium heart healthy    Complete by:  As directed      Discharge instructions    Complete by:  As directed   Okay to shower. Do not apply salves or appointments to incision. No heavy lifting with the upper extremities greater than 15 pounds. May resume driving when not requiring pain medication and patient feels comfortable with doing so.     Increase activity slowly    Complete by:  As directed             Medication List     TAKE these medications        CHOLEST OFF PO  Take 900 mg by mouth daily.     diazepam 5 MG tablet  Commonly known as:  VALIUM  Take 1 tablet (5 mg total) by mouth every 6 (six) hours as needed for muscle spasms.     diazepam 5 MG tablet  Commonly known as:  VALIUM  Take 5 mg by mouth at bedtime.     DULoxetine 60 MG capsule  Commonly known as:  CYMBALTA  Take 60 mg by mouth daily before breakfast.     FISH OIL EXTRA STRENGTH PO  Take 1,500 mg by mouth daily.     losartan 50 MG tablet  Commonly known as:  COZAAR  Take 50 mg by mouth daily after breakfast.     Magnesium 400 MG Tabs  Take 400 mg by mouth daily.     multivitamin with minerals Tabs tablet  Take 1 tablet by mouth daily.     nabumetone 500 MG tablet  Commonly known as:  RELAFEN  Take 1 tablet (500 mg total) by mouth daily.     oxyCODONE-acetaminophen 10-325 MG per tablet  Commonly known as:  PERCOCET  Take 1 tablet by mouth every 3 (three) hours as needed for pain.     oxyCODONE-acetaminophen 10-325 MG per tablet  Commonly known as:  PERCOCET  Take 1 tablet by mouth 3 (three) times daily as needed for  pain.     polyethylene glycol packet  Commonly known as:  MIRALAX / GLYCOLAX  Take 17 g by mouth every evening.     PROSTATE HEALTH PO  Take 1 capsule by mouth daily with supper. TRUNATURE PROSTATE     tamsulosin 0.4 MG Caps capsule  Commonly known as:  FLOMAX  Take 0.4 mg by mouth daily.     testosterone cypionate 100 MG/ML injection  Commonly known as:  DEPOTESTOTERONE CYPIONATE  Inject 50 mg into the muscle See admin instructions. For IM use only. Take every 10 days     timolol 0.5 % ophthalmic solution  Commonly known as:  BETIMOL  Place 1 drop into both eyes 2 (two) times daily.     vitamin B-12 1000 MCG tablet  Commonly known as:  CYANOCOBALAMIN  Take 1,000 mcg by mouth daily.     Vitamin D 2000 UNITS tablet  Take 2,000 Units by mouth every evening.     ZEGERID PO  Take 1 capsule  by mouth at bedtime. 10mg /1100mg          Signed: Earleen Newport 04/26/2015, 2:43 PM

## 2015-04-26 NOTE — Progress Notes (Signed)
Patient is being d/c home. D/c instructions given and patient verbalized understanding. Condition stable.

## 2015-07-05 ENCOUNTER — Other Ambulatory Visit: Payer: Self-pay | Admitting: Neurological Surgery

## 2015-07-05 DIAGNOSIS — M899 Disorder of bone, unspecified: Secondary | ICD-10-CM

## 2015-07-09 ENCOUNTER — Ambulatory Visit
Admission: RE | Admit: 2015-07-09 | Discharge: 2015-07-09 | Disposition: A | Discharge status: Home / Self Care | Payer: Medicare Other | Source: Ambulatory Visit | Attending: Neurological Surgery | Admitting: Neurological Surgery

## 2015-07-09 DIAGNOSIS — M899 Disorder of bone, unspecified: Secondary | ICD-10-CM

## 2015-07-09 MED ORDER — IOPAMIDOL (ISOVUE-300) INJECTION 61%
75.0000 mL | Freq: Once | INTRAVENOUS | Status: AC | PRN
  Administered 2015-07-09: 75 mL via INTRAVENOUS

## 2015-07-20 ENCOUNTER — Ambulatory Visit: Payer: Medicare Other | Attending: Neurological Surgery | Admitting: Physical Therapy

## 2015-07-20 DIAGNOSIS — G8929 Other chronic pain: Secondary | ICD-10-CM | POA: Diagnosis present

## 2015-07-20 DIAGNOSIS — R29898 Other symptoms and signs involving the musculoskeletal system: Secondary | ICD-10-CM | POA: Diagnosis present

## 2015-07-20 DIAGNOSIS — M6281 Muscle weakness (generalized): Secondary | ICD-10-CM | POA: Diagnosis present

## 2015-07-20 DIAGNOSIS — M545 Low back pain: Secondary | ICD-10-CM | POA: Insufficient documentation

## 2015-07-20 NOTE — Patient Instructions (Signed)
  HAMSTRING STRETCH WITH TOWEL  While lying down on your back, hook a towel or strap under  your foot and draw up your leg until a stretch is felt under your leg. calf area.  Keep your knee in a straightened position during the stretch.  Hold 20 seconds, perform 3 repetitions.  3 times a day.    Perform all exercises below:  Hold _20___ seconds. Repeat _3___ times.  Do __3__ sessions per day. CAUTION: Movement should be gentle, steady and slow.  Knee to Chest  Lying supine, bend involved knee to chest. Perform with each leg.  Copyright  VHI. All rights reserved.   Lumbar Rotation: Caudal - Bilateral (Supine)  Feet and knees together, arms outstretched, rotate knees left, turning head in opposite direction, until stretch is felt.      Hip Flexion / Knee Extension: Straight-Leg Raise (Eccentric)   Lie on back. Lift leg with knee straight. Slowly lower leg for 3-5 seconds. ___ reps per set, ___ sets per day, ___ days per week. Lower like elevator, stopping at each floor. Add ___ lbs when you achieve ___ repetitions. Rest on elbows. Rest on straight arms.  ABDUCTION: Side-Lying (Active)   Lie on left side, top leg straight. Raise top leg as far as possible. Use ___ lbs. Complete ___ sets of ___ repetitions. Perform ___ sessions per day.  http://gtsc.exer.us/94   (Home) Extension: Hip   With support under abdomen, tighten stomach. Lift right leg in line with body. Do not hyperextend. Alternate legs. Repeat ____ times per set. Do ____ sets per session. Do ____ sessions per week.    [place] [address] [place], [place] [address] Phone # [phone] Fax [phone]

## 2015-07-20 NOTE — Therapy (Signed)
[facility] [address], [place], [place], [address] Phone: [phone]   Fax:  [phone]  Physical Therapy Evaluation  Patient Details  Name: Seth Schwartz MRN: [id number] Date of Birth: 06-18-1936 Referring Provider:  [name], MD  Encounter Date: 07/20/2015      PT End of Session - 07/20/15 1141    Visit Number 1   Date for PT Re-Evaluation 09/14/15   PT Start Time 1100   PT Stop Time 1145   PT Time Calculation (min) 45 min   Activity Tolerance Patient tolerated treatment well   Behavior During Therapy [facility] for tasks assessed/performed      Past Medical History  Diagnosis Date  . Enlarged prostate     takes Uroxatral nightly  . Anxiety     takes Valium prn  . Depression     takes Luvox daily  . GERD (gastroesophageal reflux disease)     takes Omeprazole daily  . Hyperlipidemia     takes Cholestoff at night and Fish Oil bid  . Pneumonia     as a baby  . Weakness     left leg and both hands  . Chronic low back pain   . Skin spots-aging   . H/O hiatal hernia   . History of gastric ulcer 1967  . History of colon polyps   . Urinary frequency   . Urinary urgency   . History of kidney stones     lithotripsyx1  . Nocturia   . Glaucoma     uses eye drops daily  . Insomnia     takes Trazodone nightly  . Constipation     takes Benefiber and stool softener daily  . History of stress test 2010    told wnl, on treadmill  . Hypertension     takes Losartan - takes in a.m. , not referred to cardiac, had stress test 6-7 yrs. ago, told wnl  . Shortness of breath dyspnea     due to pain & being out of shape  . Arthritis     spinal DDD, stenosis  . Headache(784.0)     r/t cervical issues, occasionally has severe headache & takes NSAID & Benadryl    Past Surgical History  Procedure Laterality Date  . Hemorrhoid surgery  2009    x 2  . Trigger finger release Left 4-4yrs ago    middle finger   . Anterior cervical decomp/discectomy fusion N/A 12/17/2012    Procedure: ANTERIOR CERVICAL DECOMPRESSION/DISCECTOMY FUSION 2 LEVELS;  Surgeon: [name], MD;  Location: [facility];  Service: Neurosurgery;  Laterality: N/A;  Cervical three-four Anterior cervical decompression/diskectomy/fusion, with anterior revision of Cervical six-seven pseudoarthrosis  . Anterior cervical decomp/discectomy fusion  1998  . Colonoscopy    . Esophagogastroduodenoscopy  4/15    esophagus stretched  . Lithotripsy    . Anterior lumbar fusion N/A 04/01/2013    Procedure: ANTERIOR LUMBAR FUSION 1 LEVEL;  Surgeon: [name], MD;  Location: [facility];  Service: Neurosurgery;  Laterality: N/A;  Lumbar five-Sacral One Anterior lumbar interbody fusion  . Anterior cervical decomp/discectomy fusion N/A 06/02/2014    Procedure: Cervical three-four, Cervical six-seven  Anterior Cervical Decompression/diskectomy/fusion with Iliac crest bone graft;  Surgeon: [name], MD;  Location: [facility];  Service: Neurosurgery;  Laterality: N/A;  . Back surgery  2000/2002/2007    3 lumbars, 1 cervical  with fusion  . Eye surgery Bilateral 2008    bilateral cataracts  removed, 02/2015 & 6/ 2016,  glaucoma has improved     There were no vitals filed for this visit.  Visit Diagnosis:  Chronic bilateral low back pain without sciatica - Plan: PT plan of care cert/re-cert  Weakness of both lower extremities - Plan: PT plan of care cert/re-cert  Weakness of back - Plan: PT plan of care cert/re-cert      Subjective Assessment - 07/20/15 1100    Subjective Pt is 8 weeks s/p L1-L2 fusion on top of L2 to sacrum previous fusion. pt has been gradually working up to increase activity, still limited by pain.   Patient is accompained by: Family member   Pertinent History cervical fusions   Limitations Walking;Sitting;Standing   How long can you sit comfortably? 1 hour   How long can you stand comfortably? 15-20 minutes   How  long can you walk comfortably? 25 minutes   Patient Stated Goals go on vacation to the beach   Currently in Pain? Yes   Pain Score 5    Pain Location Back   Pain Descriptors / Indicators Aching   Pain Type Surgical pain   Pain Onset More than a month ago   Pain Frequency Intermittent   Aggravating Factors  staying in 1 position for too long   Pain Relieving Factors meds   Effect of Pain on Daily Activities wants to be more active            [facility] PT Assessment - 07/20/15 0001    Assessment   Medical Diagnosis spinal stenosis, lumbar region   Onset Date/Surgical Date 06/01/15   Precautions   Precautions Back   Restrictions   Weight Bearing Restrictions No   Balance Screen   Has the patient fallen in the past 6 months Yes   How many times? 1   Has the patient had a decrease in activity level because of a fear of falling?  Yes  due to pain   [place] residence   Living Arrangements Spouse/significant other   Home Layout Two level   Prior Function   Level of [place]   Overall Cognitive Status Within Functional Limits for tasks assessed   Observation/Other Assessments   Focus on Therapeutic Outcomes (FOTO)  60% limited   Sensation   Light Touch Appears Intact   Posture/Postural Control   Posture Comments forward flexed in standing   ROM / Strength   AROM / PROM / Strength AROM;Strength   AROM   Overall AROM Comments limited lumbar ROM due to fusion   Strength   Strength Assessment Site Hip   Right/Left Hip Right;Left   Right Hip Flexion 3+/5   Right Hip Extension 3+/5   Right Hip ABduction 3+/5   Left Hip Flexion 3+/5   Left Hip Extension 3+/5   Left Hip ABduction 3+/5   Flexibility   Soft Tissue Assessment /Muscle Length --  decreased hamstring length bilaterally   Palpation   Palpation comment no tenderness to palpation in lumbar or sacral area   Special Tests    Special Tests --  SLR neg  bilat   Transfers   Transfers --  sit to stand increased time due to weakness                           PT Education - 07/20/15 1136    Education provided Yes   Education Details HEP, PT  POC   Person(s) Educated Patient   Methods Explanation;Demonstration;Handout   Comprehension Returned demonstration;Verbalized understanding          PT Short Term Goals - 2015/07/21 1144    PT SHORT TERM GOAL #1   Title Pt will be independent with initial HEP   Time 4   Period Weeks   Status New   PT SHORT TERM GOAL #2   Title Pt will improve B LE strength to 4-/5   Time 4   Period Weeks   Status New   PT SHORT TERM GOAL #3   Title Pt will tolerate walking x 20 minutes with pain <=5/10           PT Long Term Goals - 21-Jul-2015 1145    PT LONG TERM GOAL #1   Title Pt will improve FOTO to <=50%   Time 8   Period Weeks   Status New   PT LONG TERM GOAL #2   Title Pt will tolerate walking x 30 minutes with pain <=3/10   Time 8   Period Weeks   Status New   PT LONG TERM GOAL #3   Title Pt will demo 4+/5 B LE strength   Time 8   Period Weeks   PT LONG TERM GOAL #4   Title Pt will be independent in advanced HEP   Time 8   Period Weeks   Status New               Plan - 21-Jul-2015 1142    Clinical Impression Statement Pt with history of multiple surgeries, recent lumbar fusion.  Pt presents wtih decreased strength, mobility, ROM and activity tolerance. Pt wil benefit form skilled PT to address deficits and increase function.   Rehab Potential Good   PT Frequency 2x / week   PT Duration 8 weeks   PT Treatment/Interventions ADLs/Self Care Home Management;DME Instruction;Manual techniques;Therapeutic activities;Cryotherapy;Patient/family education;Taping;Moist Heat;Electrical Stimulation;Balance training;Neuromuscular re-education;Passive range of motion;Therapeutic exercise;Functional mobility training   PT Next Visit Plan assess HEP, progress core strength  as tolerated   PT Home Exercise Plan yes   Consulted and Agree with Plan of Care Patient          G-Codes - 07/21/15 1146    Functional Limitation Changing and maintaining body position   Changing and Maintaining Body Position Current Status (C1638) At least 60 percent but less than 80 percent impaired, limited or restricted   Changing and Maintaining Body Position Goal Status (G5364) At least 40 percent but less than 60 percent impaired, limited or restricted       Problem List Patient Active Problem List   Diagnosis Date Noted  . 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 Pseudoarthrosis of cervical spine ([place]) 06/02/2014    Isabelle Course, PT, DPT  07/21/2015, 11:49 AM  [facility] [address] [place], [place], [address] Phone: [phone]   Fax:  [phone]

## 2015-07-22 ENCOUNTER — Ambulatory Visit: Payer: Medicare Other | Admitting: Physical Therapy

## 2015-07-22 DIAGNOSIS — M545 Low back pain, unspecified: Secondary | ICD-10-CM

## 2015-07-22 DIAGNOSIS — R29898 Other symptoms and signs involving the musculoskeletal system: Secondary | ICD-10-CM

## 2015-07-22 DIAGNOSIS — G8929 Other chronic pain: Secondary | ICD-10-CM

## 2015-07-22 NOTE — Therapy (Signed)
[facility] [address] [place], [place], [address] Phone: [phone]   Fax:  [phone]  Physical Therapy Treatment  Patient Details  Name: Seth Schwartz MRN: [id number] Date of Birth: Sep 20, 1936 Referring Provider:  [name], MD  Encounter Date: 07/22/2015      PT End of Session - 07/22/15 1130    Visit Number 2   Date for PT Re-Evaluation 09/14/15   PT Start Time 1053   PT Stop Time 1132   PT Time Calculation (min) 39 min      Past Medical History  Diagnosis Date  . Enlarged prostate     takes Uroxatral nightly  . Anxiety     takes Valium prn  . Depression     takes Luvox daily  . GERD (gastroesophageal reflux disease)     takes Omeprazole daily  . Hyperlipidemia     takes Cholestoff at night and Fish Oil bid  . Pneumonia     as a baby  . Weakness     left leg and both hands  . Chronic low back pain   . Skin spots-aging   . H/O hiatal hernia   . History of gastric ulcer 1967  . History of colon polyps   . Urinary frequency   . Urinary urgency   . History of kidney stones     lithotripsyx1  . Nocturia   . Glaucoma     uses eye drops daily  . Insomnia     takes Trazodone nightly  . Constipation     takes Benefiber and stool softener daily  . History of stress test 2010    told wnl, on treadmill  . Hypertension     takes Losartan - takes in a.m. , not referred to cardiac, had stress test 6-7 yrs. ago, told wnl  . Shortness of breath dyspnea     due to pain & being out of shape  . Arthritis     spinal DDD, stenosis  . Headache(784.0)     r/t cervical issues, occasionally has severe headache & takes NSAID & Benadryl    Past Surgical History  Procedure Laterality Date  . Hemorrhoid surgery  2009    x 2  . Trigger finger release Left 4-40yrs ago    middle finger  . Anterior cervical decomp/discectomy fusion N/A 12/17/2012    Procedure: ANTERIOR CERVICAL  DECOMPRESSION/DISCECTOMY FUSION 2 LEVELS;  Surgeon: [name], MD;  Location: [facility];  Service: Neurosurgery;  Laterality: N/A;  Cervical three-four Anterior cervical decompression/diskectomy/fusion, with anterior revision of Cervical six-seven pseudoarthrosis  . Anterior cervical decomp/discectomy fusion  1998  . Colonoscopy    . Esophagogastroduodenoscopy  4/15    esophagus stretched  . Lithotripsy    . Anterior lumbar fusion N/A 04/01/2013    Procedure: ANTERIOR LUMBAR FUSION 1 LEVEL;  Surgeon: [name], MD;  Location: [facility];  Service: Neurosurgery;  Laterality: N/A;  Lumbar five-Sacral One Anterior lumbar interbody fusion  . Anterior cervical decomp/discectomy fusion N/A 06/02/2014    Procedure: Cervical three-four, Cervical six-seven  Anterior Cervical Decompression/diskectomy/fusion with Iliac crest bone graft;  Surgeon: [name], MD;  [facility];  Service: Neurosurgery;  Laterality: N/A;  . Back surgery  2000/2002/2007    3 lumbars, 1 cervical  with fusion  . Eye surgery Bilateral 2008    bilateral cataracts removed, 02/2015 & 6/ 2016,  glaucoma has improved     There were no vitals  filed for this visit.  Visit Diagnosis:  Chronic bilateral low back pain without sciatica  Weakness of both lower extremities  Weakness of back      Subjective Assessment - 07/22/15 1057    Subjective Pt reports he is sore from HEP, no increased pain, but muscle soreness   Pertinent History cervical fusions   Limitations Walking;Sitting;Standing   How long can you sit comfortably? 1 hour   How long can you stand comfortably? 15-20 minutes   How long can you walk comfortably? 25 minutes   Patient Stated Goals go on vacation to the beach   Currently in Pain? Yes   Pain Score 4    Pain Location Back   Pain Orientation Lower   Pain Descriptors / Indicators Aching   Pain Onset More than a month ago   Pain Frequency Intermittent                          [facility] Adult PT Treatment/Exercise - 07/22/15 0001    Exercises   Exercises Lumbar   Lumbar Exercises: Stretches   Passive Hamstring Stretch 2 reps;20 seconds   Single Knee to Chest Stretch 2 reps;20 seconds   Lower Trunk Rotation 60 seconds  pain free range   Lumbar Exercises: Aerobic   Stationary Bike nustep level 1 x 5 miniutes  seat 10, arms 10   Lumbar Exercises: Standing   Heel Raises 20 reps   Other Standing Lumbar Exercises hip extension 20x bilat   Lumbar Exercises: Seated   Sit to Stand 20 reps  without UE support   Lumbar Exercises: Supine   Ab Set 10 reps;3 seconds  ab set with alt UE lift x 10, cues to breath   Straight Leg Raise 20 reps   Lumbar Exercises: Sidelying   Hip Abduction 20 reps                  PT Short Term Goals - 07/22/15 1132    PT SHORT TERM GOAL #1   Title Pt will be independent with initial HEP   Time 4   Period Weeks   Status On-going   PT SHORT TERM GOAL #2   Title Pt will improve B LE strength to 4-/5   Time 4   Period Weeks   Status On-going   PT SHORT TERM GOAL #3   Title Pt will tolerate walking x 20 minutes with pain <=5/10   Time 4   Period Weeks   Status On-going           PT Long Term Goals - 07/22/15 1132    PT LONG TERM GOAL #1   Title Pt will improve FOTO to <=50%   Time 8   Period Weeks   Status On-going   PT LONG TERM GOAL #2   Title Pt will tolerate walking x 30 minutes with pain <=3/10   Time 8   Period Weeks   Status On-going   PT LONG TERM GOAL #3   Title Pt will demo 4+/5 B LE strength   Time 8   Period Weeks   Status On-going   PT LONG TERM GOAL #4   Title Pt will be independent in advanced HEP   Time 8   Period Weeks   Status On-going               Plan - 07/22/15 1131    Clinical Impression Statement Pt able to tolerate increased  core exercises without pain.  Pt with good motivation to improve.  Pt requires frequent rests during hip  abduction due to fatigue.   Pt will benefit from skilled therapeutic intervention in order to improve on the following deficits Decreased endurance;Pain;Increased muscle spasms;Decreased strength;Decreased mobility;Impaired flexibility;Decreased balance   PT Frequency 2x / week   PT Duration 8 weeks   PT Treatment/Interventions ADLs/Self Care Home Management;DME Instruction;Manual techniques;Therapeutic activities;Cryotherapy;Patient/family education;Taping;Moist Heat;Electrical Stimulation;Balance training;Neuromuscular re-education;Passive range of motion;Therapeutic exercise;Functional mobility training   PT Next Visit Plan progress core strength   Consulted and Agree with Plan of Care Patient        Problem List Patient Active Problem List   Diagnosis Date Noted  . 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 Pseudoarthrosis of cervical spine ([place]) 06/02/2014    Isabelle Course, PT, DPT  07/22/2015, 11:33 AM  [facility] [address], [place], [place], [address] Phone: [phone]   Fax:  [phone]

## 2015-07-27 ENCOUNTER — Ambulatory Visit: Payer: Medicare Other

## 2015-07-27 DIAGNOSIS — M545 Low back pain: Secondary | ICD-10-CM | POA: Diagnosis not present

## 2015-07-27 DIAGNOSIS — R29898 Other symptoms and signs involving the musculoskeletal system: Secondary | ICD-10-CM

## 2015-07-27 DIAGNOSIS — G8929 Other chronic pain: Secondary | ICD-10-CM

## 2015-07-27 NOTE — Therapy (Signed)
[facility] [address] [place], [place], [address] Phone: [phone]   Fax:  [phone]  Physical Therapy Treatment  Patient Details  Name: Seth Schwartz MRN: [id number] Date of Birth: November 01, 1935 Referring Provider: [name], MD  Encounter Date: 07/27/2015      PT End of Session - 07/27/15 1136    Visit Number 3   Number of Visits 10   Date for PT Re-Evaluation 09/14/15   PT Start Time 1100   PT Stop Time 1140   PT Time Calculation (min) 40 min   Activity Tolerance Patient tolerated treatment well   Behavior During Therapy [facility] for tasks assessed/performed      Past Medical History  Diagnosis Date  . Enlarged prostate     takes Uroxatral nightly  . Anxiety     takes Valium prn  . Depression     takes Luvox daily  . GERD (gastroesophageal reflux disease)     takes Omeprazole daily  . Hyperlipidemia     takes Cholestoff at night and Fish Oil bid  . Pneumonia     as a baby  . Weakness     left leg and both hands  . Chronic low back pain   . Skin spots-aging   . H/O hiatal hernia   . History of gastric ulcer 1967  . History of colon polyps   . Urinary frequency   . Urinary urgency   . History of kidney stones     lithotripsyx1  . Nocturia   . Glaucoma     uses eye drops daily  . Insomnia     takes Trazodone nightly  . Constipation     takes Benefiber and stool softener daily  . History of stress test 2010    told wnl, on treadmill  . Hypertension     takes Losartan - takes in a.m. , not referred to cardiac, had stress test 6-7 yrs. ago, told wnl  . Shortness of breath dyspnea     due to pain & being out of shape  . Arthritis     spinal DDD, stenosis  . Headache(784.0)     r/t cervical issues, occasionally has severe headache & takes NSAID & Benadryl    Past Surgical History  Procedure Laterality Date  . Hemorrhoid surgery  2009    x 2  . Trigger finger release Left 4-41yrs  ago    middle finger  . Anterior cervical decomp/discectomy fusion N/A 12/17/2012    Procedure: ANTERIOR CERVICAL DECOMPRESSION/DISCECTOMY FUSION 2 LEVELS;  Surgeon: [name], MD;  Location: [facility];  Service: Neurosurgery;  Laterality: N/A;  Cervical three-four Anterior cervical decompression/diskectomy/fusion, with anterior revision of Cervical six-seven pseudoarthrosis  . Anterior cervical decomp/discectomy fusion  1998  . Colonoscopy    . Esophagogastroduodenoscopy  4/15    esophagus stretched  . Lithotripsy    . Anterior lumbar fusion N/A 04/01/2013    Procedure: ANTERIOR LUMBAR FUSION 1 LEVEL;  Surgeon: [name], MD;  Location: [facility];  Service: Neurosurgery;  Laterality: N/A;  Lumbar five-Sacral One Anterior lumbar interbody fusion  . Anterior cervical decomp/discectomy fusion N/A 06/02/2014    Procedure: Cervical three-four, Cervical six-seven  Anterior Cervical Decompression/diskectomy/fusion with Iliac crest bone graft;  Surgeon: [name], MD;  Location: [facility];  Service: Neurosurgery;  Laterality: N/A;  . Back surgery  2000/2002/2007    3 lumbars, 1 cervical  with fusion  . Eye surgery Bilateral 2008  bilateral cataracts removed, 02/2015 & 6/ 2016,  glaucoma has improved     There were no vitals filed for this visit.  Visit Diagnosis:  Chronic bilateral low back pain without sciatica  Weakness of both lower extremities  Weakness of back      Subjective Assessment - 07/27/15 1103    Subjective Pt reports that he had fatigue and had to sleep a lot after last session.  Started walking on the treadmill again yesterday.     Currently in Pain? Yes   Pain Score 4    Pain Location Back   Pain Orientation Lower;Right;Left   Pain Descriptors / Indicators Aching   Pain Type Surgical pain   Pain Onset More than a month ago   Pain Frequency Intermittent   Aggravating Factors  too much exercise   Pain Relieving Factors meds, rest            [facility]  PT Assessment - 07/27/15 0001    Assessment   Referring Provider [name], MD                     [facility] Adult PT Treatment/Exercise - 07/27/15 0001    Exercises   Exercises Knee/Hip   Lumbar Exercises: Stretches   Active Hamstring Stretch 3 reps;20 seconds   Lumbar Exercises: Aerobic   Stationary Bike nustep level 1 x 5 miniutes  seat 10, arms 10   Lumbar Exercises: Standing   Heel Raises 20 reps   Other Standing Lumbar Exercises hip extension, marching and abduction 2x10 each   Knee/Hip Exercises: Seated   Long Arc Quad Both;20 reps  hold 3 seconds                PT Education - 07/27/15 1120    Education provided Yes   Education Details HEP: standing hip exercises, seated hamstring stretch   Person(s) Educated Patient   Methods Explanation;Demonstration;Handout   Comprehension Verbalized understanding;Returned demonstration          PT Short Term Goals - 07/27/15 1106    PT SHORT TERM GOAL #1   Title Pt will be independent with initial HEP   PT SHORT TERM GOAL #3   Title Pt will tolerate walking x 20 minutes with pain <=5/10   Time 4   Period Weeks   Status On-going           PT Long Term Goals - 07/22/15 1132    PT LONG TERM GOAL #1   Title Pt will improve FOTO to <=50%   Time 8   Period Weeks   Status On-going   PT LONG TERM GOAL #2   Title Pt will tolerate walking x 30 minutes with pain <=3/10   Time 8   Period Weeks   Status On-going   PT LONG TERM GOAL #3   Title Pt will demo 4+/5 B LE strength   Time 8   Period Weeks   Status On-going   PT LONG TERM GOAL #4   Title Pt will be independent in advanced HEP   Time 8   Period Weeks   Status On-going               Plan - 07/27/15 1107    Clinical Impression Statement Pt with slow mobility and had flare-up after last session.  Pt is motivated to do more each day  and needs to take it slowly.  Pt with forward trunk flexion in standing and has difficulty  maintaining upright position.  Pt walks for exercise for 15 minutes and reports 6/10 LBP when done.  Pt will benefit from skilled PT for posture retraining, core strength, flexibility and endurance training.     Pt will benefit from skilled therapeutic intervention in order to improve on the following deficits Decreased endurance;Pain;Increased muscle spasms;Decreased strength;Decreased mobility;Impaired flexibility;Decreased balance   Rehab Potential Good   PT Frequency 2x / week   PT Duration 8 weeks   PT Treatment/Interventions ADLs/Self Care Home Management;DME Instruction;Manual techniques;Therapeutic activities;Cryotherapy;Patient/family education;Taping;Moist Heat;Electrical Stimulation;Balance training;Neuromuscular re-education;Passive range of motion;Therapeutic exercise;Functional mobility training   PT Next Visit Plan Endurance, flexibility, strength. Review HEP issued this session.  Pt might reduced to 1x/wk starting next week due to high co pay.   Consulted and Agree with Plan of Care Patient        Problem List Patient Active Problem List   Diagnosis Date Noted  . 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 Pseudoarthrosis of cervical spine ([place]) 06/02/2014    Lennox Leikam, PT 07/27/2015, 11:40 AM  [facility] [address] [place], [place], [address] Phone: [phone]   Fax:  [phone]  Name: Seth Schwartz MRN: [id number] Date of Birth: 12/06/1935

## 2015-07-27 NOTE — Patient Instructions (Signed)
Knee High   Holding stable object, raise knee to hip level, then lower knee. Repeat with other knee. Complete __10_ repetitions. Do __2__ sessions per day.  ABDUCTION: Standing (Active)   Stand, feet flat. Lift right leg out to side. Use _0__ lbs. Complete __10_ repetitions. Perform __2_ sessions per day.   EXTENSION: Standing (Active)  Stand, both feet flat. Draw right leg behind body as far as possible. Use 0___ lbs. Complete 10 repetitions. Perform __2_ sessions per day.  Copyright  VHI. All rights reserved.   HIP: Hamstrings - Short Sitting    Rest leg on raised surface. Keep knee straight. Lift chest. Hold _20__ seconds. _3__ reps per set, _1__ sets per day, _3_ days per week  Copyright  VHI. All rights reserved.  [place] [address], [place], [place] [address] Phone # [phone] Fax [phone]

## 2015-07-29 ENCOUNTER — Ambulatory Visit: Payer: Medicare Other | Admitting: Physical Therapy

## 2015-07-29 DIAGNOSIS — M545 Low back pain, unspecified: Secondary | ICD-10-CM

## 2015-07-29 DIAGNOSIS — G8929 Other chronic pain: Secondary | ICD-10-CM

## 2015-07-29 DIAGNOSIS — R29898 Other symptoms and signs involving the musculoskeletal system: Secondary | ICD-10-CM

## 2015-07-29 NOTE — Therapy (Addendum)
[facility] [address] [place], [place], [address] Phone: [phone]   Fax:  [phone]  Physical Therapy Treatment  Patient Details  Name: Seth Schwartz MRN: [id number] Date of Birth: 1936-01-07 Referring Provider: [name], MD  Encounter Date: 07/29/2015      PT End of Session - 07/29/15 1139    Visit Number 4   Number of Visits 10   Date for PT Re-Evaluation 09/14/15   PT Start Time 2683   PT Stop Time 1140   PT Time Calculation (min) 43 min   Activity Tolerance Patient tolerated treatment well   Behavior During Therapy [facility] for tasks assessed/performed      Past Medical History  Diagnosis Date  . Enlarged prostate     takes Uroxatral nightly  . Anxiety     takes Valium prn  . Depression     takes Luvox daily  . GERD (gastroesophageal reflux disease)     takes Omeprazole daily  . Hyperlipidemia     takes Cholestoff at night and Fish Oil bid  . Pneumonia     as a baby  . Weakness     left leg and both hands  . Chronic low back pain   . Skin spots-aging   . H/O hiatal hernia   . History of gastric ulcer 1967  . History of colon polyps   . Urinary frequency   . Urinary urgency   . History of kidney stones     lithotripsyx1  . Nocturia   . Glaucoma     uses eye drops daily  . Insomnia     takes Trazodone nightly  . Constipation     takes Benefiber and stool softener daily  . History of stress test 2010    told wnl, on treadmill  . Hypertension     takes Losartan - takes in a.m. , not referred to cardiac, had stress test 6-7 yrs. ago, told wnl  . Shortness of breath dyspnea     due to pain & being out of shape  . Arthritis     spinal DDD, stenosis  . Headache(784.0)     r/t cervical issues, occasionally has severe headache & takes NSAID & Benadryl    Past Surgical History  Procedure Laterality Date  . Hemorrhoid surgery  2009    x 2  . Trigger finger release Left 4-37yr  ago    middle finger  . Anterior cervical decomp/discectomy fusion N/A 12/17/2012    Procedure: ANTERIOR CERVICAL DECOMPRESSION/DISCECTOMY FUSION 2 LEVELS;  Surgeon: HKristeen Miss MD;  [facility];  Service: Neurosurgery;  Laterality: N/A;  Cervical three-four Anterior cervical decompression/diskectomy/fusion, with anterior revision of Cervical six-seven pseudoarthrosis  . Anterior cervical decomp/discectomy fusion  1998  . Colonoscopy    . Esophagogastroduodenoscopy  4/15    esophagus stretched  . Lithotripsy    . Anterior lumbar fusion N/A 04/01/2013    Procedure: ANTERIOR LUMBAR FUSION 1 LEVEL;  Surgeon: HKristeen Miss MD;  [place] [facility];  Service: Neurosurgery;  Laterality: N/A;  Lumbar five-Sacral One Anterior lumbar interbody fusion  . Anterior cervical decomp/discectomy fusion N/A 06/02/2014    Procedure: Cervical three-four, Cervical six-seven  Anterior Cervical Decompression/diskectomy/fusion with Iliac crest bone graft;  Surgeon: HKristeen Miss MD;  [facility];  Service: Neurosurgery;  Laterality: N/A;  . Back surgery  2000/2002/2007    3 lumbars, 1 cervical  with fusion  . Eye surgery Bilateral 2008  bilateral cataracts removed, 02/2015 & 6/ 2016,  glaucoma has improved     There were no vitals filed for this visit.  Visit Diagnosis:  Chronic bilateral low back pain without sciatica  Weakness of both lower extremities  Weakness of back      Subjective Assessment - 07/29/15 1111    Subjective Pt states "since I've been to therapy, by back has gotten worse".  Pt with flexed trunk in standing, more difficulty walking   Pertinent History cervical fusions   Limitations Walking;Sitting;Standing   Patient Stated Goals go on vacation to the beach   Pain Score 7    Pain Location Back   Pain Orientation Lower   Aggravating Factors  over exercise   Pain Relieving Factors rest                         [facility] Adult PT  Treatment/Exercise - 07/29/15 0001    Lumbar Exercises: Aerobic   Stationary Bike nustep level 2 x 5 minutes  10/10   Lumbar Exercises: Standing   Heel Raises 20 reps   Lumbar Exercises: Seated   Long Arc Quad on [place] Both;10 reps  wt shifts on ball lateral, CW, CCW   Sit to Stand 20 reps   Lumbar Exercises: Supine   Ab Set 10 reps;3 seconds  and with march 2 x 10   Other Supine Lumbar Exercises hip abd yelllow band x 20   Other Supine Lumbar Exercises ab set with ball sqeeze with knee ext x 10 bilat                PT Education - 07/29/15 1139    Education provided Yes   Education Details HEP for siting on theraball, wt shifts and LAQ while HOLDING ON   Person(s) Educated Patient   Methods Explanation;Demonstration   Comprehension Verbalized understanding;Returned demonstration          PT Short Term Goals - 07/29/15 1141    PT SHORT TERM GOAL #1   Title Pt will be independent with initial HEP   Time 4   Period Weeks   Status On-going   PT SHORT TERM GOAL #2   Title Pt will improve B LE strength to 4-/5   Time 4   Period Weeks   Status On-going   PT SHORT TERM GOAL #3   Title Pt will tolerate walking x 20 minutes with pain <=5/10   Time 4   Period Weeks   Status On-going           PT Long Term Goals - 07/29/15 1142    PT LONG TERM GOAL #1   Title Pt will improve FOTO to <=50%   Time 8   Period Weeks   Status On-going   PT LONG TERM GOAL #2   Title Pt will tolerate walking x 30 minutes with pain <=3/10   Time 8   Period Weeks   Status On-going   PT LONG TERM GOAL #3   Title Pt will demo 4+/5 B LE strength   Time 8   Period Weeks   Status On-going   PT LONG TERM GOAL #4   Title Pt will be independent in advanced HEP   Time 8   Period Weeks   Status On-going               Plan - 07/29/15 1140    Clinical Impression Statement Pt improved throughout session, did not perform  any SLS activities this session, supine and seated therex  to help prevent LBP.  Pt will continue to benefit from skilled PT for strength, endurance, flexibility and posture   Pt will benefit from skilled therapeutic intervention in order to improve on the following deficits Decreased endurance;Pain;Increased muscle spasms;Decreased strength;Decreased mobility;Impaired flexibility;Decreased balance   Rehab Potential Good   PT Frequency 2x / week  1 x per week due to co pay   PT Duration 8 weeks   PT Treatment/Interventions ADLs/Self Care Home Management;DME Instruction;Manual techniques;Therapeutic activities;Cryotherapy;Patient/family education;Taping;Moist Heat;Electrical Stimulation;Balance training;Neuromuscular re-education;Passive range of motion;Therapeutic exercise;Functional mobility training   PT Next Visit Plan assess HEP and affect on pain.  progress core strength as tolerated        Problem List Patient Active Problem List   Diagnosis Date Noted  . 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 Pseudoarthrosis of cervical spine ([place]) 06/02/2014    Isabelle Course, PT, DPT  08-09-15, 11:44 AM G-codes: Changing position  Goal status CK D/C status: CL PHYSICAL THERAPY DISCHARGE SUMMARY  Visits from Start of Care: 4  Current functional level related to goals / functional outcomes: Pt attended 4 PT sessions and had increased pain with activity.  Pt requested to be put on hold due to pain.  Pt didn't return.     Remaining deficits: See above for most current goal status.  Pt will follow-up with MD as needed.     Education / Equipment: HEP Plan: Patient agrees to discharge.  Patient goals were not met. Patient is being discharged due to the patient's request.  ?????   Sigurd Sos, PT 10/05/2015 7:41 AM  [facility] [address], [place], [place], [address] Phone: [phone]   Fax:  [phone]  Name: Seth Schwartz MRN:  [id number] Date of Birth: Apr 03, 1936

## 2015-08-05 ENCOUNTER — Encounter: Payer: Medicare Other | Admitting: Physical Therapy

## 2015-08-10 ENCOUNTER — Encounter: Payer: Medicare Other | Admitting: Physical Therapy

## 2015-08-12 ENCOUNTER — Encounter: Payer: Medicare Other | Admitting: Physical Therapy

## 2015-08-17 ENCOUNTER — Encounter: Payer: Medicare Other | Admitting: Physical Therapy

## 2015-08-19 ENCOUNTER — Encounter: Payer: Medicare Other | Admitting: Physical Therapy

## 2015-08-24 ENCOUNTER — Encounter: Payer: Medicare Other | Admitting: Physical Therapy

## 2015-08-26 ENCOUNTER — Encounter: Payer: Medicare Other | Admitting: Physical Therapy

## 2015-12-07 DIAGNOSIS — N400 Enlarged prostate without lower urinary tract symptoms: Secondary | ICD-10-CM | POA: Diagnosis not present

## 2015-12-07 DIAGNOSIS — D751 Secondary polycythemia: Secondary | ICD-10-CM | POA: Diagnosis not present

## 2015-12-07 DIAGNOSIS — Z23 Encounter for immunization: Secondary | ICD-10-CM | POA: Diagnosis not present

## 2015-12-07 DIAGNOSIS — E669 Obesity, unspecified: Secondary | ICD-10-CM | POA: Diagnosis not present

## 2015-12-07 DIAGNOSIS — G629 Polyneuropathy, unspecified: Secondary | ICD-10-CM | POA: Diagnosis not present

## 2015-12-07 DIAGNOSIS — Z0000 Encounter for general adult medical examination without abnormal findings: Secondary | ICD-10-CM | POA: Diagnosis not present

## 2015-12-07 DIAGNOSIS — E782 Mixed hyperlipidemia: Secondary | ICD-10-CM | POA: Diagnosis not present

## 2015-12-07 DIAGNOSIS — I10 Essential (primary) hypertension: Secondary | ICD-10-CM | POA: Diagnosis not present

## 2015-12-07 DIAGNOSIS — K219 Gastro-esophageal reflux disease without esophagitis: Secondary | ICD-10-CM | POA: Diagnosis not present

## 2015-12-07 DIAGNOSIS — K635 Polyp of colon: Secondary | ICD-10-CM | POA: Diagnosis not present

## 2015-12-07 DIAGNOSIS — N200 Calculus of kidney: Secondary | ICD-10-CM | POA: Diagnosis not present

## 2015-12-20 DIAGNOSIS — R194 Change in bowel habit: Secondary | ICD-10-CM | POA: Diagnosis not present

## 2015-12-20 DIAGNOSIS — R110 Nausea: Secondary | ICD-10-CM | POA: Diagnosis not present

## 2015-12-20 DIAGNOSIS — K219 Gastro-esophageal reflux disease without esophagitis: Secondary | ICD-10-CM | POA: Diagnosis not present

## 2015-12-30 DIAGNOSIS — L570 Actinic keratosis: Secondary | ICD-10-CM | POA: Diagnosis not present

## 2015-12-30 DIAGNOSIS — L218 Other seborrheic dermatitis: Secondary | ICD-10-CM | POA: Diagnosis not present

## 2015-12-30 DIAGNOSIS — D2371 Other benign neoplasm of skin of right lower limb, including hip: Secondary | ICD-10-CM | POA: Diagnosis not present

## 2015-12-30 DIAGNOSIS — L821 Other seborrheic keratosis: Secondary | ICD-10-CM | POA: Diagnosis not present

## 2015-12-30 DIAGNOSIS — D2372 Other benign neoplasm of skin of left lower limb, including hip: Secondary | ICD-10-CM | POA: Diagnosis not present

## 2016-01-03 DIAGNOSIS — R194 Change in bowel habit: Secondary | ICD-10-CM | POA: Diagnosis not present

## 2016-01-25 DIAGNOSIS — M25552 Pain in left hip: Secondary | ICD-10-CM | POA: Diagnosis not present

## 2016-02-03 DIAGNOSIS — Z6829 Body mass index (BMI) 29.0-29.9, adult: Secondary | ICD-10-CM | POA: Diagnosis not present

## 2016-02-03 DIAGNOSIS — M25552 Pain in left hip: Secondary | ICD-10-CM | POA: Diagnosis not present

## 2016-02-03 DIAGNOSIS — M5416 Radiculopathy, lumbar region: Secondary | ICD-10-CM | POA: Diagnosis not present

## 2016-02-11 ENCOUNTER — Other Ambulatory Visit: Payer: Self-pay | Admitting: Neurological Surgery

## 2016-02-11 DIAGNOSIS — M5416 Radiculopathy, lumbar region: Secondary | ICD-10-CM

## 2016-02-11 DIAGNOSIS — M25552 Pain in left hip: Secondary | ICD-10-CM

## 2016-02-15 DIAGNOSIS — H40013 Open angle with borderline findings, low risk, bilateral: Secondary | ICD-10-CM | POA: Diagnosis not present

## 2016-02-15 DIAGNOSIS — Z961 Presence of intraocular lens: Secondary | ICD-10-CM | POA: Diagnosis not present

## 2016-02-19 ENCOUNTER — Ambulatory Visit
Admission: RE | Admit: 2016-02-19 | Discharge: 2016-02-19 | Disposition: A | Discharge status: Home / Self Care | Payer: Medicare Other | Source: Ambulatory Visit | Attending: Neurological Surgery | Admitting: Neurological Surgery

## 2016-02-19 DIAGNOSIS — S73192A Other sprain of left hip, initial encounter: Secondary | ICD-10-CM | POA: Diagnosis not present

## 2016-02-19 DIAGNOSIS — M5416 Radiculopathy, lumbar region: Secondary | ICD-10-CM

## 2016-02-19 DIAGNOSIS — M4326 Fusion of spine, lumbar region: Secondary | ICD-10-CM | POA: Diagnosis not present

## 2016-02-19 DIAGNOSIS — M25552 Pain in left hip: Secondary | ICD-10-CM

## 2016-02-19 MED ORDER — GADOBENATE DIMEGLUMINE 529 MG/ML IV SOLN
19.0000 mL | Freq: Once | INTRAVENOUS | Status: AC | PRN
  Administered 2016-02-19: 19 mL via INTRAVENOUS

## 2016-02-24 DIAGNOSIS — Z6829 Body mass index (BMI) 29.0-29.9, adult: Secondary | ICD-10-CM | POA: Diagnosis not present

## 2016-03-02 DIAGNOSIS — M25552 Pain in left hip: Secondary | ICD-10-CM | POA: Diagnosis not present

## 2016-03-28 DIAGNOSIS — G629 Polyneuropathy, unspecified: Secondary | ICD-10-CM | POA: Diagnosis not present

## 2016-03-28 DIAGNOSIS — K219 Gastro-esophageal reflux disease without esophagitis: Secondary | ICD-10-CM | POA: Diagnosis not present

## 2016-03-28 DIAGNOSIS — R739 Hyperglycemia, unspecified: Secondary | ICD-10-CM | POA: Diagnosis not present

## 2016-03-28 DIAGNOSIS — L749 Eccrine sweat disorder, unspecified: Secondary | ICD-10-CM | POA: Diagnosis not present

## 2016-03-28 DIAGNOSIS — E669 Obesity, unspecified: Secondary | ICD-10-CM | POA: Diagnosis not present

## 2016-03-28 DIAGNOSIS — D751 Secondary polycythemia: Secondary | ICD-10-CM | POA: Diagnosis not present

## 2016-03-28 DIAGNOSIS — N400 Enlarged prostate without lower urinary tract symptoms: Secondary | ICD-10-CM | POA: Diagnosis not present

## 2016-03-28 DIAGNOSIS — K635 Polyp of colon: Secondary | ICD-10-CM | POA: Diagnosis not present

## 2016-03-28 DIAGNOSIS — R61 Generalized hyperhidrosis: Secondary | ICD-10-CM | POA: Diagnosis not present

## 2016-03-28 DIAGNOSIS — E782 Mixed hyperlipidemia: Secondary | ICD-10-CM | POA: Diagnosis not present

## 2016-03-28 DIAGNOSIS — N200 Calculus of kidney: Secondary | ICD-10-CM | POA: Diagnosis not present

## 2016-03-28 DIAGNOSIS — I10 Essential (primary) hypertension: Secondary | ICD-10-CM | POA: Diagnosis not present

## 2016-03-30 DIAGNOSIS — M25552 Pain in left hip: Secondary | ICD-10-CM | POA: Diagnosis not present

## 2016-04-14 DIAGNOSIS — E291 Testicular hypofunction: Secondary | ICD-10-CM | POA: Diagnosis not present

## 2016-04-17 DIAGNOSIS — M25551 Pain in right hip: Secondary | ICD-10-CM | POA: Diagnosis not present

## 2016-04-17 DIAGNOSIS — M25552 Pain in left hip: Secondary | ICD-10-CM | POA: Diagnosis not present

## 2016-05-24 DIAGNOSIS — Z6827 Body mass index (BMI) 27.0-27.9, adult: Secondary | ICD-10-CM | POA: Diagnosis not present

## 2016-05-24 DIAGNOSIS — M5416 Radiculopathy, lumbar region: Secondary | ICD-10-CM | POA: Diagnosis not present

## 2016-06-09 DIAGNOSIS — M461 Sacroiliitis, not elsewhere classified: Secondary | ICD-10-CM | POA: Diagnosis not present

## 2016-07-13 DIAGNOSIS — M533 Sacrococcygeal disorders, not elsewhere classified: Secondary | ICD-10-CM | POA: Diagnosis not present

## 2016-08-01 DIAGNOSIS — D751 Secondary polycythemia: Secondary | ICD-10-CM | POA: Diagnosis not present

## 2016-08-01 DIAGNOSIS — N200 Calculus of kidney: Secondary | ICD-10-CM | POA: Diagnosis not present

## 2016-08-01 DIAGNOSIS — E1142 Type 2 diabetes mellitus with diabetic polyneuropathy: Secondary | ICD-10-CM | POA: Diagnosis not present

## 2016-08-01 DIAGNOSIS — E669 Obesity, unspecified: Secondary | ICD-10-CM | POA: Diagnosis not present

## 2016-08-01 DIAGNOSIS — N400 Enlarged prostate without lower urinary tract symptoms: Secondary | ICD-10-CM | POA: Diagnosis not present

## 2016-08-01 DIAGNOSIS — Z23 Encounter for immunization: Secondary | ICD-10-CM | POA: Diagnosis not present

## 2016-08-01 DIAGNOSIS — I10 Essential (primary) hypertension: Secondary | ICD-10-CM | POA: Diagnosis not present

## 2016-08-01 DIAGNOSIS — K635 Polyp of colon: Secondary | ICD-10-CM | POA: Diagnosis not present

## 2016-08-01 DIAGNOSIS — G629 Polyneuropathy, unspecified: Secondary | ICD-10-CM | POA: Diagnosis not present

## 2016-08-01 DIAGNOSIS — E782 Mixed hyperlipidemia: Secondary | ICD-10-CM | POA: Diagnosis not present

## 2016-08-01 DIAGNOSIS — Z7984 Long term (current) use of oral hypoglycemic drugs: Secondary | ICD-10-CM | POA: Diagnosis not present

## 2016-08-10 DIAGNOSIS — M47817 Spondylosis without myelopathy or radiculopathy, lumbosacral region: Secondary | ICD-10-CM | POA: Diagnosis not present

## 2016-08-10 DIAGNOSIS — M533 Sacrococcygeal disorders, not elsewhere classified: Secondary | ICD-10-CM | POA: Diagnosis not present

## 2016-08-11 DIAGNOSIS — E1142 Type 2 diabetes mellitus with diabetic polyneuropathy: Secondary | ICD-10-CM | POA: Diagnosis not present

## 2016-08-11 DIAGNOSIS — E782 Mixed hyperlipidemia: Secondary | ICD-10-CM | POA: Diagnosis not present

## 2016-08-11 DIAGNOSIS — Z7984 Long term (current) use of oral hypoglycemic drugs: Secondary | ICD-10-CM | POA: Diagnosis not present

## 2016-08-21 DIAGNOSIS — E291 Testicular hypofunction: Secondary | ICD-10-CM | POA: Diagnosis not present

## 2016-08-24 DIAGNOSIS — M47817 Spondylosis without myelopathy or radiculopathy, lumbosacral region: Secondary | ICD-10-CM | POA: Diagnosis not present

## 2016-08-28 DIAGNOSIS — H401130 Primary open-angle glaucoma, bilateral, stage unspecified: Secondary | ICD-10-CM | POA: Diagnosis not present

## 2016-08-28 DIAGNOSIS — H40043 Steroid responder, bilateral: Secondary | ICD-10-CM | POA: Diagnosis not present

## 2016-10-12 DIAGNOSIS — Z6826 Body mass index (BMI) 26.0-26.9, adult: Secondary | ICD-10-CM | POA: Diagnosis not present

## 2016-10-12 DIAGNOSIS — M533 Sacrococcygeal disorders, not elsewhere classified: Secondary | ICD-10-CM | POA: Diagnosis not present

## 2016-10-27 DIAGNOSIS — M461 Sacroiliitis, not elsewhere classified: Secondary | ICD-10-CM | POA: Diagnosis not present

## 2016-11-30 DIAGNOSIS — H401111 Primary open-angle glaucoma, right eye, mild stage: Secondary | ICD-10-CM | POA: Diagnosis not present

## 2016-12-07 DIAGNOSIS — M899 Disorder of bone, unspecified: Secondary | ICD-10-CM | POA: Diagnosis not present

## 2016-12-07 DIAGNOSIS — E1142 Type 2 diabetes mellitus with diabetic polyneuropathy: Secondary | ICD-10-CM | POA: Diagnosis not present

## 2016-12-07 DIAGNOSIS — Z0000 Encounter for general adult medical examination without abnormal findings: Secondary | ICD-10-CM | POA: Diagnosis not present

## 2016-12-07 DIAGNOSIS — N400 Enlarged prostate without lower urinary tract symptoms: Secondary | ICD-10-CM | POA: Diagnosis not present

## 2016-12-07 DIAGNOSIS — R202 Paresthesia of skin: Secondary | ICD-10-CM | POA: Diagnosis not present

## 2016-12-07 DIAGNOSIS — G629 Polyneuropathy, unspecified: Secondary | ICD-10-CM | POA: Diagnosis not present

## 2016-12-07 DIAGNOSIS — I10 Essential (primary) hypertension: Secondary | ICD-10-CM | POA: Diagnosis not present

## 2016-12-07 DIAGNOSIS — N200 Calculus of kidney: Secondary | ICD-10-CM | POA: Diagnosis not present

## 2016-12-07 DIAGNOSIS — D751 Secondary polycythemia: Secondary | ICD-10-CM | POA: Diagnosis not present

## 2016-12-07 DIAGNOSIS — Z7984 Long term (current) use of oral hypoglycemic drugs: Secondary | ICD-10-CM | POA: Diagnosis not present

## 2016-12-07 DIAGNOSIS — Z125 Encounter for screening for malignant neoplasm of prostate: Secondary | ICD-10-CM | POA: Diagnosis not present

## 2016-12-07 DIAGNOSIS — E782 Mixed hyperlipidemia: Secondary | ICD-10-CM | POA: Diagnosis not present

## 2016-12-27 ENCOUNTER — Other Ambulatory Visit: Payer: Self-pay | Admitting: Neurological Surgery

## 2016-12-27 DIAGNOSIS — M899 Disorder of bone, unspecified: Secondary | ICD-10-CM

## 2016-12-27 DIAGNOSIS — M533 Sacrococcygeal disorders, not elsewhere classified: Secondary | ICD-10-CM

## 2016-12-29 ENCOUNTER — Ambulatory Visit
Admission: RE | Admit: 2016-12-29 | Discharge: 2016-12-29 | Disposition: A | Discharge status: Home / Self Care | Payer: Medicare Other | Source: Ambulatory Visit | Attending: Neurological Surgery | Admitting: Neurological Surgery

## 2016-12-29 DIAGNOSIS — M533 Sacrococcygeal disorders, not elsewhere classified: Secondary | ICD-10-CM

## 2016-12-29 DIAGNOSIS — M899 Disorder of bone, unspecified: Secondary | ICD-10-CM

## 2016-12-29 DIAGNOSIS — R102 Pelvic and perineal pain: Secondary | ICD-10-CM | POA: Diagnosis not present

## 2016-12-29 DIAGNOSIS — R51 Headache: Secondary | ICD-10-CM | POA: Diagnosis not present

## 2016-12-29 MED ORDER — IOPAMIDOL (ISOVUE-300) INJECTION 61%
75.0000 mL | Freq: Once | INTRAVENOUS | Status: AC | PRN
  Administered 2016-12-29: 75 mL via INTRAVENOUS

## 2017-01-03 DIAGNOSIS — D692 Other nonthrombocytopenic purpura: Secondary | ICD-10-CM | POA: Diagnosis not present

## 2017-01-03 DIAGNOSIS — D1801 Hemangioma of skin and subcutaneous tissue: Secondary | ICD-10-CM | POA: Diagnosis not present

## 2017-01-03 DIAGNOSIS — M7130 Other bursal cyst, unspecified site: Secondary | ICD-10-CM | POA: Diagnosis not present

## 2017-01-03 DIAGNOSIS — L821 Other seborrheic keratosis: Secondary | ICD-10-CM | POA: Diagnosis not present

## 2017-01-03 DIAGNOSIS — L565 Disseminated superficial actinic porokeratosis (DSAP): Secondary | ICD-10-CM | POA: Diagnosis not present

## 2017-01-03 DIAGNOSIS — C44629 Squamous cell carcinoma of skin of left upper limb, including shoulder: Secondary | ICD-10-CM | POA: Diagnosis not present

## 2017-01-03 DIAGNOSIS — L570 Actinic keratosis: Secondary | ICD-10-CM | POA: Diagnosis not present

## 2017-01-03 DIAGNOSIS — D485 Neoplasm of uncertain behavior of skin: Secondary | ICD-10-CM | POA: Diagnosis not present

## 2017-01-03 DIAGNOSIS — H401131 Primary open-angle glaucoma, bilateral, mild stage: Secondary | ICD-10-CM | POA: Diagnosis not present

## 2017-01-03 DIAGNOSIS — H61001 Unspecified perichondritis of right external ear: Secondary | ICD-10-CM | POA: Diagnosis not present

## 2017-01-04 DIAGNOSIS — M533 Sacrococcygeal disorders, not elsewhere classified: Secondary | ICD-10-CM | POA: Diagnosis not present

## 2017-01-15 ENCOUNTER — Encounter (INDEPENDENT_AMBULATORY_CARE_PROVIDER_SITE_OTHER): Payer: Self-pay | Admitting: Orthopedic Surgery

## 2017-01-15 ENCOUNTER — Ambulatory Visit (INDEPENDENT_AMBULATORY_CARE_PROVIDER_SITE_OTHER): Payer: PPO | Admitting: Orthopedic Surgery

## 2017-01-15 DIAGNOSIS — M1612 Unilateral primary osteoarthritis, left hip: Secondary | ICD-10-CM | POA: Diagnosis not present

## 2017-01-15 DIAGNOSIS — E1142 Type 2 diabetes mellitus with diabetic polyneuropathy: Secondary | ICD-10-CM | POA: Diagnosis not present

## 2017-01-15 NOTE — Progress Notes (Addendum)
Office Visit Note   Patient: Seth Schwartz           Date of Birth: 01/11/1936           MRN: [id number] Visit Date: 01/15/2017              Requested by: Vernie Shanks, MD [address] [place], [place] [address] PCP: Anthoney Harada, MD  Chief Complaint  Patient presents with  . Left Hip - Pain  . Left Middle Finger - Follow-up    Cyst      HPI: Patient is 81 year old gentleman with type 2 diabetes who states he is status post about 8 lumbar spine fusion surgeries and multiple lumbar injections. Patient states that the glaucoma pressure as I has been elevated due to the increased steroid injections. Patient also complains of a cyst on the long finger of the left hand DIP joint. 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 Patient states that his left lateral hip pain and left groin pain is worse after prolonged walking. Patient states he has had a injection with Dr. Ernestina Patches and this did not provide him any relief   Assessment & Plan: Visit Diagnoses:  1. Diabetic polyneuropathy associated with type 2 diabetes mellitus ([place])   2. Unilateral primary osteoarthritis, left hip     Plan: Patient is to start working on an exercise program such as stationary bicycling to strengthen the muscles. Patient has significant atrophy and atonia of his muscles. Discussed that he could try Aleve 2 by mouth twice a day but not to take other anti-inflammatories with this. Recommended observation for the mucoid cyst of the left long finger. There is no signs of infection no complicating features. Also discussed proper diet for his diabetes to be limited to greens beans nuts and fruit.  Follow-Up Instructions: Return if symptoms worsen or fail to improve.   Ortho Exam  Patient is alert, oriented, no adenopathy, well-dressed, normal affect, normal respiratory effort. Patient does have an abductor lurch on the left. Patient has symmetric internal and  external rotation of both hips with about 30 of internal rotation bilaterally and 45 of external rotation bilaterally has no pain with range of motion of the hip but does have pain with weightbearing. His CT scan was reviewed as well as the plain radiographs were reviewed of the left hip and this does show some subcondylar sclerosis with some arthritis involving the joint. There is a joint space. Examination the left long finger he has mucoid cyst just distal to the DIP joint of the long finger. This is starting to cause some nail deformity from the cyst there is no signs of infection this is approximately 3 mm in diameter.  Imaging: No results found.  Labs: No results found for: HGBA1C, ESRSEDRATE, CRP, LABURIC, REPTSTATUS, GRAMSTAIN, CULT, LABORGA  Orders:  No orders of the defined types were placed in this encounter.  No orders of the defined types were placed in this encounter.    Procedures: No procedures performed  Clinical Data: No additional findings.  ROS:  All other systems negative, except as noted in the HPI. Review of Systems  Objective: Vital Signs: There were no vitals taken for this visit.  Specialty Comments:  No specialty comments available.  PMFS History: Patient Active Problem List   Diagnosis Date Noted  . 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 Pseudoarthrosis of cervical spine ([place]) 06/02/2014  Past Medical History:  Diagnosis Date  . Anxiety    takes Valium prn  . Arthritis    spinal DDD, stenosis  . Chronic low back pain   . Constipation    takes Benefiber and stool softener daily  . Depression    takes Luvox daily  . Enlarged prostate    takes Uroxatral nightly  . GERD (gastroesophageal reflux disease)    takes Omeprazole daily  . Glaucoma    uses eye drops daily  . H/O hiatal hernia   . Headache(784.0)    r/t cervical issues, occasionally has severe headache & takes NSAID & Benadryl  . History  of colon polyps   . History of gastric ulcer 1967  . History of kidney stones    lithotripsyx1  . History of stress test 2010   told wnl, on treadmill  . Hyperlipidemia    takes Cholestoff at night and Fish Oil bid  . Hypertension    takes Losartan - takes in a.m. , not referred to cardiac, had stress test 6-7 yrs. ago, told wnl  . Insomnia    takes Trazodone nightly  . Nocturia   . Pneumonia    as a baby  . Shortness of breath dyspnea    due to pain & being out of shape  . Skin spots-aging   . Urinary frequency   . Urinary urgency   . Weakness    left leg and both hands    History reviewed. No pertinent family history.  Past Surgical History:  Procedure Laterality Date  . ANTERIOR CERVICAL DECOMP/DISCECTOMY FUSION N/A 12/17/2012   Procedure: ANTERIOR CERVICAL DECOMPRESSION/DISCECTOMY FUSION 2 LEVELS;  Surgeon: [name], MD;  Location: [place] [facility];  Service: Neurosurgery;  Laterality: N/A;  Cervical three-four Anterior cervical decompression/diskectomy/fusion, with anterior revision of Cervical six-seven pseudoarthrosis  . ANTERIOR CERVICAL DECOMP/DISCECTOMY FUSION  1998  . ANTERIOR CERVICAL DECOMP/DISCECTOMY FUSION N/A 06/02/2014   Procedure: Cervical three-four, Cervical six-seven  Anterior Cervical Decompression/diskectomy/fusion with Iliac crest bone graft;  Surgeon: [name], MD;  Location: [place];  Service: Neurosurgery;  Laterality: N/A;  . ANTERIOR LUMBAR FUSION N/A 04/01/2013   Procedure: ANTERIOR LUMBAR FUSION 1 LEVEL;  Surgeon: [name], MD;  Location: [facility];  Service: Neurosurgery;  Laterality: N/A;  Lumbar five-Sacral One Anterior lumbar interbody fusion  . BACK SURGERY  2000/2002/2007   3 lumbars, 1 cervical  with fusion  . COLONOSCOPY    . ESOPHAGOGASTRODUODENOSCOPY  4/15   esophagus stretched  . EYE SURGERY Bilateral 2008   bilateral cataracts removed, 02/2015 & 6/ 2016,  glaucoma has improved   . HEMORRHOID SURGERY  2009   x 2  .  LITHOTRIPSY    . TRIGGER FINGER RELEASE Left 4-23yrs ago   middle finger   Social History   Occupational History  . Not on file.   Social History Main Topics  . Smoking status: Former Smoker    Packs/day: 0.25    Years: 25.00    Types: Cigarettes    Quit date: 05/28/1979  . Smokeless tobacco: Not on file     Comment: quit about 25yrs ago  . Alcohol use No     Comment: none in 58yrs   . Drug use: No  . Sexual activity: Not Currently

## 2017-02-06 DIAGNOSIS — K219 Gastro-esophageal reflux disease without esophagitis: Secondary | ICD-10-CM | POA: Diagnosis not present

## 2017-02-06 DIAGNOSIS — L749 Eccrine sweat disorder, unspecified: Secondary | ICD-10-CM | POA: Diagnosis not present

## 2017-02-06 DIAGNOSIS — E1142 Type 2 diabetes mellitus with diabetic polyneuropathy: Secondary | ICD-10-CM | POA: Diagnosis not present

## 2017-02-06 DIAGNOSIS — G629 Polyneuropathy, unspecified: Secondary | ICD-10-CM | POA: Diagnosis not present

## 2017-02-06 DIAGNOSIS — K635 Polyp of colon: Secondary | ICD-10-CM | POA: Diagnosis not present

## 2017-02-06 DIAGNOSIS — E669 Obesity, unspecified: Secondary | ICD-10-CM | POA: Diagnosis not present

## 2017-02-06 DIAGNOSIS — D751 Secondary polycythemia: Secondary | ICD-10-CM | POA: Diagnosis not present

## 2017-02-06 DIAGNOSIS — E782 Mixed hyperlipidemia: Secondary | ICD-10-CM | POA: Diagnosis not present

## 2017-02-06 DIAGNOSIS — N200 Calculus of kidney: Secondary | ICD-10-CM | POA: Diagnosis not present

## 2017-02-06 DIAGNOSIS — N400 Enlarged prostate without lower urinary tract symptoms: Secondary | ICD-10-CM | POA: Diagnosis not present

## 2017-02-06 DIAGNOSIS — I10 Essential (primary) hypertension: Secondary | ICD-10-CM | POA: Diagnosis not present

## 2017-02-15 DIAGNOSIS — R948 Abnormal results of function studies of other organs and systems: Secondary | ICD-10-CM | POA: Diagnosis not present

## 2017-02-15 DIAGNOSIS — E291 Testicular hypofunction: Secondary | ICD-10-CM | POA: Diagnosis not present

## 2017-02-20 DIAGNOSIS — E291 Testicular hypofunction: Secondary | ICD-10-CM | POA: Diagnosis not present

## 2017-03-01 DIAGNOSIS — M47817 Spondylosis without myelopathy or radiculopathy, lumbosacral region: Secondary | ICD-10-CM | POA: Diagnosis not present

## 2017-03-27 DIAGNOSIS — H401122 Primary open-angle glaucoma, left eye, moderate stage: Secondary | ICD-10-CM | POA: Diagnosis not present

## 2017-03-27 DIAGNOSIS — H401111 Primary open-angle glaucoma, right eye, mild stage: Secondary | ICD-10-CM | POA: Diagnosis not present

## 2017-04-05 DIAGNOSIS — H401111 Primary open-angle glaucoma, right eye, mild stage: Secondary | ICD-10-CM | POA: Diagnosis not present

## 2017-04-05 DIAGNOSIS — D3131 Benign neoplasm of right choroid: Secondary | ICD-10-CM | POA: Diagnosis not present

## 2017-04-05 DIAGNOSIS — E119 Type 2 diabetes mellitus without complications: Secondary | ICD-10-CM | POA: Diagnosis not present

## 2017-04-05 DIAGNOSIS — D3132 Benign neoplasm of left choroid: Secondary | ICD-10-CM | POA: Diagnosis not present

## 2017-04-05 DIAGNOSIS — H401122 Primary open-angle glaucoma, left eye, moderate stage: Secondary | ICD-10-CM | POA: Diagnosis not present

## 2017-04-23 DIAGNOSIS — D751 Secondary polycythemia: Secondary | ICD-10-CM | POA: Diagnosis not present

## 2017-04-23 DIAGNOSIS — R202 Paresthesia of skin: Secondary | ICD-10-CM | POA: Diagnosis not present

## 2017-04-23 DIAGNOSIS — M1990 Unspecified osteoarthritis, unspecified site: Secondary | ICD-10-CM | POA: Diagnosis not present

## 2017-04-23 DIAGNOSIS — E669 Obesity, unspecified: Secondary | ICD-10-CM | POA: Diagnosis not present

## 2017-04-23 DIAGNOSIS — N400 Enlarged prostate without lower urinary tract symptoms: Secondary | ICD-10-CM | POA: Diagnosis not present

## 2017-04-23 DIAGNOSIS — G629 Polyneuropathy, unspecified: Secondary | ICD-10-CM | POA: Diagnosis not present

## 2017-04-23 DIAGNOSIS — E1142 Type 2 diabetes mellitus with diabetic polyneuropathy: Secondary | ICD-10-CM | POA: Diagnosis not present

## 2017-04-23 DIAGNOSIS — K219 Gastro-esophageal reflux disease without esophagitis: Secondary | ICD-10-CM | POA: Diagnosis not present

## 2017-04-23 DIAGNOSIS — R42 Dizziness and giddiness: Secondary | ICD-10-CM | POA: Diagnosis not present

## 2017-04-23 DIAGNOSIS — I10 Essential (primary) hypertension: Secondary | ICD-10-CM | POA: Diagnosis not present

## 2017-04-23 DIAGNOSIS — E782 Mixed hyperlipidemia: Secondary | ICD-10-CM | POA: Diagnosis not present

## 2017-07-16 DIAGNOSIS — R739 Hyperglycemia, unspecified: Secondary | ICD-10-CM | POA: Diagnosis not present

## 2017-07-16 DIAGNOSIS — R202 Paresthesia of skin: Secondary | ICD-10-CM | POA: Diagnosis not present

## 2017-07-16 DIAGNOSIS — M1990 Unspecified osteoarthritis, unspecified site: Secondary | ICD-10-CM | POA: Diagnosis not present

## 2017-07-16 DIAGNOSIS — Z23 Encounter for immunization: Secondary | ICD-10-CM | POA: Diagnosis not present

## 2017-07-16 DIAGNOSIS — R61 Generalized hyperhidrosis: Secondary | ICD-10-CM | POA: Diagnosis not present

## 2017-07-16 DIAGNOSIS — E782 Mixed hyperlipidemia: Secondary | ICD-10-CM | POA: Diagnosis not present

## 2017-08-14 DIAGNOSIS — M65312 Trigger thumb, left thumb: Secondary | ICD-10-CM | POA: Diagnosis not present

## 2017-08-14 DIAGNOSIS — M1990 Unspecified osteoarthritis, unspecified site: Secondary | ICD-10-CM | POA: Diagnosis not present

## 2017-08-14 DIAGNOSIS — M7072 Other bursitis of hip, left hip: Secondary | ICD-10-CM | POA: Diagnosis not present

## 2017-08-14 DIAGNOSIS — R61 Generalized hyperhidrosis: Secondary | ICD-10-CM | POA: Diagnosis not present

## 2017-08-14 DIAGNOSIS — M65311 Trigger thumb, right thumb: Secondary | ICD-10-CM | POA: Diagnosis not present

## 2017-08-14 DIAGNOSIS — R7301 Impaired fasting glucose: Secondary | ICD-10-CM | POA: Diagnosis not present

## 2017-08-14 DIAGNOSIS — E782 Mixed hyperlipidemia: Secondary | ICD-10-CM | POA: Diagnosis not present

## 2017-08-14 DIAGNOSIS — Z23 Encounter for immunization: Secondary | ICD-10-CM | POA: Diagnosis not present

## 2017-08-28 DIAGNOSIS — M65312 Trigger thumb, left thumb: Secondary | ICD-10-CM | POA: Diagnosis not present

## 2017-08-28 DIAGNOSIS — Z7984 Long term (current) use of oral hypoglycemic drugs: Secondary | ICD-10-CM | POA: Diagnosis not present

## 2017-08-28 DIAGNOSIS — E782 Mixed hyperlipidemia: Secondary | ICD-10-CM | POA: Diagnosis not present

## 2017-08-28 DIAGNOSIS — M1990 Unspecified osteoarthritis, unspecified site: Secondary | ICD-10-CM | POA: Diagnosis not present

## 2017-08-28 DIAGNOSIS — E1165 Type 2 diabetes mellitus with hyperglycemia: Secondary | ICD-10-CM | POA: Diagnosis not present

## 2017-08-28 DIAGNOSIS — E1142 Type 2 diabetes mellitus with diabetic polyneuropathy: Secondary | ICD-10-CM | POA: Diagnosis not present

## 2017-08-28 DIAGNOSIS — F339 Major depressive disorder, recurrent, unspecified: Secondary | ICD-10-CM | POA: Diagnosis not present

## 2017-08-28 DIAGNOSIS — M65311 Trigger thumb, right thumb: Secondary | ICD-10-CM | POA: Diagnosis not present

## 2017-08-28 DIAGNOSIS — R61 Generalized hyperhidrosis: Secondary | ICD-10-CM | POA: Diagnosis not present

## 2017-08-28 DIAGNOSIS — M7072 Other bursitis of hip, left hip: Secondary | ICD-10-CM | POA: Diagnosis not present

## 2017-09-28 DIAGNOSIS — R61 Generalized hyperhidrosis: Secondary | ICD-10-CM | POA: Diagnosis not present

## 2017-09-28 DIAGNOSIS — M1990 Unspecified osteoarthritis, unspecified site: Secondary | ICD-10-CM | POA: Diagnosis not present

## 2017-09-28 DIAGNOSIS — F339 Major depressive disorder, recurrent, unspecified: Secondary | ICD-10-CM | POA: Diagnosis not present

## 2017-09-28 DIAGNOSIS — Z7984 Long term (current) use of oral hypoglycemic drugs: Secondary | ICD-10-CM | POA: Diagnosis not present

## 2017-09-28 DIAGNOSIS — M130 Polyarthritis, unspecified: Secondary | ICD-10-CM | POA: Diagnosis not present

## 2017-09-28 DIAGNOSIS — M26609 Unspecified temporomandibular joint disorder, unspecified side: Secondary | ICD-10-CM | POA: Diagnosis not present

## 2017-09-28 DIAGNOSIS — E782 Mixed hyperlipidemia: Secondary | ICD-10-CM | POA: Diagnosis not present

## 2017-09-28 DIAGNOSIS — E1142 Type 2 diabetes mellitus with diabetic polyneuropathy: Secondary | ICD-10-CM | POA: Diagnosis not present

## 2017-10-08 DIAGNOSIS — R768 Other specified abnormal immunological findings in serum: Secondary | ICD-10-CM | POA: Diagnosis not present

## 2017-10-17 DIAGNOSIS — R768 Other specified abnormal immunological findings in serum: Secondary | ICD-10-CM | POA: Diagnosis not present

## 2017-10-17 DIAGNOSIS — D860 Sarcoidosis of lung: Secondary | ICD-10-CM | POA: Diagnosis not present

## 2017-10-17 DIAGNOSIS — M12871 Other specific arthropathies, not elsewhere classified, right ankle and foot: Secondary | ICD-10-CM | POA: Diagnosis not present

## 2017-10-17 DIAGNOSIS — M79641 Pain in right hand: Secondary | ICD-10-CM | POA: Diagnosis not present

## 2017-10-17 DIAGNOSIS — M12872 Other specific arthropathies, not elsewhere classified, left ankle and foot: Secondary | ICD-10-CM | POA: Diagnosis not present

## 2017-10-17 DIAGNOSIS — M79642 Pain in left hand: Secondary | ICD-10-CM | POA: Diagnosis not present

## 2017-10-17 DIAGNOSIS — M2550 Pain in unspecified joint: Secondary | ICD-10-CM | POA: Diagnosis not present

## 2017-10-17 DIAGNOSIS — M79643 Pain in unspecified hand: Secondary | ICD-10-CM | POA: Diagnosis not present

## 2017-10-17 DIAGNOSIS — M19041 Primary osteoarthritis, right hand: Secondary | ICD-10-CM | POA: Diagnosis not present

## 2017-10-17 DIAGNOSIS — M19042 Primary osteoarthritis, left hand: Secondary | ICD-10-CM | POA: Diagnosis not present

## 2017-10-17 DIAGNOSIS — M064 Inflammatory polyarthropathy: Secondary | ICD-10-CM | POA: Diagnosis not present

## 2017-10-17 DIAGNOSIS — M25649 Stiffness of unspecified hand, not elsewhere classified: Secondary | ICD-10-CM | POA: Diagnosis not present

## 2017-10-24 NOTE — Progress Notes (Signed)
This encounter was created in error - please disregard.

## 2017-10-29 ENCOUNTER — Telehealth (INDEPENDENT_AMBULATORY_CARE_PROVIDER_SITE_OTHER): Payer: Self-pay | Admitting: Orthopedic Surgery

## 2017-10-29 NOTE — Telephone Encounter (Signed)
Patient called stating that Dr. Sharol Given advised him that he had arthritis in his left hip and that when he was ready to have his surgery, Dr. Sharol Given would refer him to a surgeon that does a particular surgery.  CB#[phone].  Thank you.

## 2017-10-30 ENCOUNTER — Other Ambulatory Visit: Payer: Self-pay

## 2017-10-30 DIAGNOSIS — M1990 Unspecified osteoarthritis, unspecified site: Secondary | ICD-10-CM | POA: Diagnosis not present

## 2017-10-30 DIAGNOSIS — R768 Other specified abnormal immunological findings in serum: Secondary | ICD-10-CM | POA: Diagnosis not present

## 2017-10-30 DIAGNOSIS — M2550 Pain in unspecified joint: Secondary | ICD-10-CM | POA: Diagnosis not present

## 2017-10-30 DIAGNOSIS — M25649 Stiffness of unspecified hand, not elsewhere classified: Secondary | ICD-10-CM | POA: Diagnosis not present

## 2017-10-30 DIAGNOSIS — M064 Inflammatory polyarthropathy: Secondary | ICD-10-CM | POA: Diagnosis not present

## 2017-10-30 DIAGNOSIS — M79643 Pain in unspecified hand: Secondary | ICD-10-CM | POA: Diagnosis not present

## 2017-10-30 NOTE — Patient Outreach (Signed)
[facility]) Care Management  10/30/2017  Seth Schwartz 11-10-1935 [id number]   TELEPHONE SCREENING Referral date: 10/24/17 Referral source: Nurse call line Referral reason: elevated blood sugar Insurance: Health team advantage   PROVIDER;  Dr. Jacelyn Grip Dr. Ellene Route  Telephone call to patient regarding nurse call line referral. HIPAA verified with patient.  Discussed reason for call with patient. Patient states his blood sugar elevated to the 270's on Wednesday of last week and he felt weak and washed out. Patient states his blood sugars normal range between 120 -125 fasting.  Patient states he did not call his doctors office as advised by the nurse call line. Patient states he drank at least 8 oz of water every hour for a few hours as the nurse advised and his blood sugar came down within the normal range. RNCM advised patient to call his primary MD office today to notify them of his elevated blood sugar with symptoms that he experienced last week. Patient verbalized agreement.  Patient states he was diagnosed with diabetes approximately 1 1/2 years ago and he is taking metformin daily. Patient states he tries to watch his diet regarding carbs and sweets. Patient states, " I love breads and sweets but I'm trying to monitor the intake of this."  Patient reports seeing his primary MD approximately 2 months ago. Patient states his last A1c was 6.9. Patient reports it is up from 6.7.   Patient states his next follow up with his primary MD is for his physical in March.  Patient states he has a history of 8 back/ neck surgeries with fusion. Patient reports he is having a lot of pain in his left hip. Patient states he has called the orthopedic because he is ready to have  hip replacement surgery. Patient state he has osteoarthritis in his hip.  Patient states he is also claustrophobic. Patient states his doctor is aware of this.  Patient verbally agreed to [facility] care management services.    RNCM advised patient to notify MD of any changes in condition prior to scheduled appointment. RNCM provided contact name and number: [phone] or main office number [phone] and 24 hour nurse advise line [phone].  RNCM verified patient aware of 911 services for urgent/ emergent needs.  ASSESSMENT: patient would benefit from ongoing follow up with health coach for diabetes management and education  PLAN: RNCM will refer patient to health coach.   Quinn Plowman RN,BSN,CCM [facility] Telephonic  [phone]

## 2017-10-31 ENCOUNTER — Other Ambulatory Visit: Payer: Self-pay

## 2017-10-31 DIAGNOSIS — M064 Inflammatory polyarthropathy: Secondary | ICD-10-CM | POA: Diagnosis not present

## 2017-10-31 DIAGNOSIS — E782 Mixed hyperlipidemia: Secondary | ICD-10-CM | POA: Diagnosis not present

## 2017-10-31 DIAGNOSIS — E119 Type 2 diabetes mellitus without complications: Secondary | ICD-10-CM | POA: Diagnosis not present

## 2017-10-31 NOTE — Patient Outreach (Signed)
[facility]) Care Management  10/31/2017  Seth Schwartz Mar 18, 1936 [id number]   Telephone call to patient for introductory call.  Patient able to verify HIPAA. Patient states that he is very happy to be in the program.  Explained to the patient health coach role.  Patient is open to the call.    Plan:  RN Health Coach will make outreach call to the patient for initial assessment in the month of January.  Seth Arms RN, BSN, [place] Direct Dial:  [phone] Fax: [phone]

## 2017-11-01 NOTE — Telephone Encounter (Signed)
I called patient and made appointment with Dr. Erlinda Hong this Monday. Patient is very happy to see Dr. Erlinda Hong as his wife has seen him in the past. He is consult for surgery THA anterior approach.

## 2017-11-01 NOTE — Telephone Encounter (Signed)
Refer to Dr. Erlinda Hong

## 2017-11-05 ENCOUNTER — Ambulatory Visit (INDEPENDENT_AMBULATORY_CARE_PROVIDER_SITE_OTHER): Payer: PPO

## 2017-11-05 ENCOUNTER — Encounter (INDEPENDENT_AMBULATORY_CARE_PROVIDER_SITE_OTHER): Payer: Self-pay | Admitting: Orthopaedic Surgery

## 2017-11-05 ENCOUNTER — Ambulatory Visit (INDEPENDENT_AMBULATORY_CARE_PROVIDER_SITE_OTHER): Payer: PPO | Admitting: Orthopaedic Surgery

## 2017-11-05 DIAGNOSIS — M1612 Unilateral primary osteoarthritis, left hip: Secondary | ICD-10-CM | POA: Diagnosis not present

## 2017-11-05 DIAGNOSIS — E119 Type 2 diabetes mellitus without complications: Secondary | ICD-10-CM | POA: Diagnosis not present

## 2017-11-05 NOTE — Progress Notes (Signed)
Office Visit Note   Patient: Seth Schwartz           Date of Birth: May 25, 1936           MRN: [id number] Visit Date: 11/05/2017              Requested by: Vernie Shanks, MD [place], [place] [address] PCP: Vernie Shanks, MD   Assessment & Plan: Visit Diagnoses:  1. Primary osteoarthritis of left hip     Plan: Impression is left hip degenerative joint disease.  X-rays show moderate arthritis and joint space narrowing.  Recommend a cortisone injection with Dr. Ernestina Patches.  Follow-up in 4 weeks to see how he responds.  Patient did recently get diagnosed with diabetes and he has hypercalcemia which is currently undergoing workup.  Follow-Up Instructions: Return in about 4 weeks (around 12/03/2017).   Orders:  Orders Placed This Encounter  Procedures  . XR HIP UNILAT W OR W/O PELVIS 2-3 VIEWS LEFT   No orders of the defined types were placed in this encounter.     Procedures: No procedures performed   Clinical Data: No additional findings.   Subjective: Chief Complaint  Patient presents with  . Left Hip - Pain    Patient is a 82 year old gentleman who comes in with hip pain.  He has had multiple back surgeries by Dr. Ellene Route.  He is endorsing growing and thigh pain that is worse with activity and ambulation and prolonged standing.  It causes him pain at night.  Denies any numbness and tingling or radiation of pain past the knee    Review of Systems  Constitutional: Negative.   All other systems reviewed and are negative.    Objective: Vital Signs: There were no vitals taken for this visit.  Physical Exam  Constitutional: He is oriented to person, place, and time. He appears well-developed and well-nourished.  Pulmonary/Chest: Effort normal.  Abdominal: Soft.  Neurological: He is alert and oriented to person, place, and time.  Skin: Skin is warm.  Psychiatric: He has a normal mood and affect. His behavior is normal. Judgment and thought content  normal.  Nursing note and vitals reviewed.   Ortho Exam Left hip exam shows painful rotation especially with internal rotation.  Trochanteric bursa is nontender.  Positive Stinchfield sign.  Negative straight leg. Specialty Comments:  No specialty comments available.  Imaging: Xr Hip Unilat W Or W/o Pelvis 2-3 Views Left  Result Date: 11/05/2017 Moderate DJD of hips.      PMFS History: Patient Active Problem List   Diagnosis Date Noted  . 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 Pseudoarthrosis of cervical spine ([place]) 06/02/2014   Past Medical History:  Diagnosis Date  . Anxiety    takes Valium prn  . Arthritis    spinal DDD, stenosis  . Chronic low back pain   . Constipation    takes Benefiber and stool softener daily  . Depression    takes Luvox daily  . Enlarged prostate    takes Uroxatral nightly  . GERD (gastroesophageal reflux disease)    takes Omeprazole daily  . Glaucoma    uses eye drops daily  . H/O hiatal hernia   . Headache(784.0)    r/t cervical issues, occasionally has severe headache & takes NSAID & Benadryl  . History of colon polyps   . History of gastric ulcer 1967  . History of kidney stones    lithotripsyx1  .  History of stress test 2010   told wnl, on treadmill  . Hyperlipidemia    takes Cholestoff at night and Fish Oil bid  . Hypertension    takes Losartan - takes in a.m. , not referred to cardiac, had stress test 6-7 yrs. ago, told wnl  . Insomnia    takes Trazodone nightly  . Nocturia   . Pneumonia    as a baby  . Shortness of breath dyspnea    due to pain & being out of shape  . Skin spots-aging   . Urinary frequency   . Urinary urgency   . Weakness    left leg and both hands    History reviewed. No pertinent family history.  Past Surgical History:  Procedure Laterality Date  . ANTERIOR CERVICAL DECOMP/DISCECTOMY FUSION N/A 12/17/2012   Procedure: ANTERIOR CERVICAL DECOMPRESSION/DISCECTOMY  FUSION 2 LEVELS;  Surgeon: [name], MD;  Location: [place] [facility];  Service: Neurosurgery;  Laterality: N/A;  Cervical three-four Anterior cervical decompression/diskectomy/fusion, with anterior revision of Cervical six-seven pseudoarthrosis  . ANTERIOR CERVICAL DECOMP/DISCECTOMY FUSION  1998  . ANTERIOR CERVICAL DECOMP/DISCECTOMY FUSION N/A 06/02/2014   Procedure: Cervical three-four, Cervical six-seven  Anterior Cervical Decompression/diskectomy/fusion with Iliac crest bone graft;  Surgeon: [name], MD;  Location: [place] [facility];  Service: Neurosurgery;  Laterality: N/A;  . ANTERIOR LUMBAR FUSION N/A 04/01/2013   Procedure: ANTERIOR LUMBAR FUSION 1 LEVEL;  Surgeon: [name], MD;  Location: [place] [facility] OR[facility];  Service: Neurosurgery;  Laterality: N/A;  Lumbar five-Sacral One Anterior lumbar interbody fusion  . BACK SURGERY  2000/2002/2007   3 lumbars, 1 cervical  with fusion  . COLONOSCOPY    . ESOPHAGOGASTRODUODENOSCOPY  4/15   esophagus stretched  . EYE SURGERY Bilateral 2008   bilateral cataracts removed, 02/2015 & 6/ 2016,  glaucoma has improved   . HEMORRHOID SURGERY  2009   x 2  . LITHOTRIPSY    . TRIGGER FINGER RELEASE Left 4-17yrs ago   middle finger   Social History   Occupational History  . Not on file  Tobacco Use  . Smoking status: Former Smoker    Packs/day: 0.25    Years: 25.00    Pack years: 6.25    Types: Cigarettes    Last attempt to quit: 05/28/1979    Years since quitting: 38.4  . Tobacco comment: quit about 54yrs ago  Substance and Sexual Activity  . Alcohol use: No    Comment: none in 17yrs   . Drug use: No  . Sexual activity: Not Currently

## 2017-11-05 NOTE — Addendum Note (Signed)
Addended by: Precious Bard on: 11/05/2017 11:26 AM   Modules accepted: Orders

## 2017-11-06 ENCOUNTER — Ambulatory Visit: Payer: Self-pay

## 2017-11-08 ENCOUNTER — Other Ambulatory Visit: Payer: Self-pay

## 2017-11-08 NOTE — Patient Outreach (Signed)
[facility]) Care Management  11/08/2017  VICTORMANUEL MCLURE [birth year] [id number]   1st telephone call to the patient for an initial assessment. Patient came to the phone and stated that now was not a good time.  He asked if I could call him tomorrow.  Plan: RN Health Coach will make an outreach attempt to the patient in one business day.  [name] RN, BSN, [place] Direct Dial:  [phone] Fax: [phone]

## 2017-11-09 ENCOUNTER — Other Ambulatory Visit: Payer: Self-pay

## 2017-11-09 NOTE — Patient Outreach (Signed)
[facility]) Care Management  [place]  11/09/2017   TOMA ARTS [birth year] [id number]  Subjective: Telephone call to the patient for initial assessment. HIPAA verified. Discussed and offered [facility] care management services with patient. Patient verbally agreed to services. The patient states that he lives with his wife who helps with hs care.  He is independent with his ADLS/IADLS.  He is adherent and manages his medications.  He drives himself to his appointments.  The patient denies any falls. He states that he does have chronic pain all over that he rates as a 7/10.  He has medications to manage his pain. The patient wears glasses and has a walker in the home.  The patient had not checked his blood sugar this morning yet but yesterday it was 120. He is very enthusiastic about working to improve his health.  The patient has a Orthopedist Dr. Sherrian Divers that will be injecting his hip.  He has an appointment with his Endocrinologist Dr. Garnet Koyanagi on February 20,2019.  He also sees a Merchant navy officer Dr. Buck Mam.  The patient states that he has an advanced directive and his primary Dr Jacelyn Grip has a copy of it.  Objective:   Encounter Medications:  Outpatient Encounter Medications as of 11/09/2017  Medication Sig Note  . diazepam (VALIUM) 5 MG tablet Take 1 tablet (5 mg total) by mouth every 6 (six) hours as needed for muscle spasms.   . fluvoxaMINE (LUVOX) 50 MG tablet Take 50 mg by mouth 2 (two) times daily.   Marland Kitchen gabapentin (NEURONTIN) 300 MG capsule Take 300 mg by mouth 2 (two) times daily.   Marland Kitchen HYDROcodone-acetaminophen (NORCO) 10-325 MG tablet    . metFORMIN (GLUCOPHAGE) 500 MG tablet    . Omega-3 Fatty Acids (FISH OIL EXTRA STRENGTH PO) Take 1,500 mg by mouth daily. 04/19/2015: Has stopped for procedure, will resume afterwards  . polyethylene glycol (MIRALAX / GLYCOLAX) packet Take 17 g by mouth every evening.    . SUPER B COMPLEX/C PO Take 1 tablet by mouth daily.   . timolol  (BETIMOL) 0.5 % ophthalmic solution Place 1 drop into both eyes 2 (two) times daily. 04/19/2015: Pt unsure if med will be continued, has Dr apt on 04/22/15 and will know then  . atorvastatin (LIPITOR) 20 MG tablet    . DULoxetine (CYMBALTA) 60 MG capsule Take 60 mg by mouth daily before breakfast.    . losartan (COZAAR) 50 MG tablet Take 50 mg by mouth daily after breakfast.    . Magnesium 400 MG TABS Take 400 mg by mouth daily. 04/19/2015: Has stopped for procedure, will resume afterwards  . Misc Natural Products (PROSTATE HEALTH PO) Take 1 capsule by mouth daily with supper. TRUNATURE PROSTATE 04/19/2015: Has stopped for procedure, will resume afterwards  . Multiple Vitamin (MULTIVITAMIN WITH MINERALS) TABS Take 1 tablet by mouth daily. 04/19/2015: Has stopped for procedure, will resume afterwards  . oxyCODONE-acetaminophen (PERCOCET) 10-325 MG per tablet Take 1 tablet by mouth 3 (three) times daily as needed for pain.   . Plant Sterols and Stanols (CHOLEST OFF PO) Take 900 mg by mouth daily. 04/20/2015: Holding for surgery- last dose taken 04/16/2015  . tamsulosin (FLOMAX) 0.4 MG CAPS capsule Take 0.4 mg by mouth daily.   . vitamin B-12 (CYANOCOBALAMIN) 1000 MCG tablet Take 1,000 mcg by mouth daily. 04/19/2015: Has stopped for procedure, will resume afterwards   No facility-administered encounter medications on file as of 11/09/2017.     Functional Status:  No  flowsheet data found.  Fall/Depression Screening: Fall Risk  11/09/2017  Falls in the past year? No   PHQ 2/9 Scores 11/09/2017 10/30/2017  PHQ - 2 Score 1 1    Assessment: Patient will benefit from health coach outreach for disease management and support.   THN CM Care Plan Problem One     Most Recent Value  Care Plan Problem One  Knowledge deficit of disease process  Role Documenting the Problem One  Health Coach  Care Plan for Problem One  Active  THN Long Term Goal   In 90 days the patient will verbalize that he has lowered his a1c by  1-2 points  [facility] Long Term Goal Start Date  11/09/17  Interventions for Problem One Cole[place] talked with the patient about his goals for his a1c andhow we can work together to  reach his goal  [facility] CM Short Term Goal #1   In 30 days the patient will verbalize that he is recording his blood sugars daily and foods if high.  THN CM Short Term Goal #1 Start Date  11/09/17  Interventions for Short Term Goal #1  RN Health Coach discussed with the patient the benefits of recording his values daily  THN CM Short Term Goal #2   In 30 days the patient will verbalize that he has added some exercise to his daily routine.  THN CM Short Term Goal #2 Start Date  11/09/17  Interventions for Short Term Goal #2  [place] dicussed how exercis affect his health and will send information to the patient for chairs exercises.     Plan: RN Health Coach will provide ongoing education for patient on diabetes through phone calls and sending printed information to patient for further discussion.  RN Health Coach will send welcome packet with consent to patient as well as printed information on diabetes.  RN Health Coach will send initial barriers letter, assessment, and care plan to primary care physician.   [name] RN, BSN, [place] Direct Dial:  [phone] Fax: [phone]

## 2017-11-19 ENCOUNTER — Encounter (INDEPENDENT_AMBULATORY_CARE_PROVIDER_SITE_OTHER): Payer: Self-pay | Admitting: Physical Medicine and Rehabilitation

## 2017-11-19 ENCOUNTER — Ambulatory Visit (INDEPENDENT_AMBULATORY_CARE_PROVIDER_SITE_OTHER): Payer: PPO

## 2017-11-19 ENCOUNTER — Ambulatory Visit (INDEPENDENT_AMBULATORY_CARE_PROVIDER_SITE_OTHER): Payer: PPO | Admitting: Physical Medicine and Rehabilitation

## 2017-11-19 DIAGNOSIS — M25552 Pain in left hip: Secondary | ICD-10-CM

## 2017-11-19 NOTE — Progress Notes (Deleted)
Pt states pain in left hip, left groin and left leg. Pt states pain has been going on for about 2 years. Pt states walking makes pain worse, pain medication makes it better. -[place]ye Allergies.

## 2017-11-19 NOTE — Progress Notes (Addendum)
Seth Schwartz - 82 y.o. male MRN [id number]  Date of birth: [birth year]  Office Visit Note: Visit Date: 11/19/2017 PCP: Vernie Shanks, MD Referred by: Vernie Shanks, MD  Subjective: Chief Complaint  Patient presents with  . Left Hip - Pain  . Left Leg - Pain   HPI: [name] is a 82 year old gentleman with.  He reports this is been worsening progressively over the last 2 years.  He gets some relief with pain medication and rest and worsening with movement.  He comes in today at the request of Dr. Erlinda Schwartz for diagnostic and therapeutic anesthetic hip arthrogram on the left.  He does have diabetes and we did caution him on blood sugar control after the steroid injection.    ROS Otherwise per HPI.  Assessment & Plan: Visit Diagnoses:  1. Pain in left hip     Plan: Findings:  Diagnostic and hopefully therapeutic left anesthetic hip arthrogram.  Patient had ABSOLUTELY NO relief during the anesthetic phase of the injection.    Meds & Orders: No orders of the defined types were placed in this encounter.   Orders Placed This Encounter  Procedures  . Large Joint Inj: L hip joint  . XR C-ARM NO REPORT    Follow-up: Return for Dr. Erlinda Schwartz as scheduled.   Procedures: Large Joint Inj: L hip joint on 11/19/2017 9:40 AM Indications: pain and diagnostic evaluation Details: 22 G needle, anterior approach  Arthrogram: Yes  Medications: 80 mg triamcinolone acetonide 40 MG/ML; 3 mL bupivacaine 0.5 % Outcome: tolerated well, no immediate complications  Arthrogram demonstrated excellent flow of contrast throughout the joint surface without extravasation or obvious defect.  The patient had ABSOLUTELY NO relief of symptoms during the anesthetic phase of the injection.  Procedure, treatment alternatives, risks and benefits explained, specific risks discussed. Consent was given by the patient. Immediately prior to procedure a time out was called to verify the correct patient, procedure,  equipment, support staff and site/side marked as required. Patient was prepped and draped in the usual sterile fashion.      No notes on file   Clinical History: No specialty comments available.  He reports that he quit smoking about 38 years ago. His smoking use included cigarettes. He has a 6.25 pack-year smoking history. he has never used smokeless tobacco. No results for input(s): HGBA1C, LABURIC in the last 8760 hours.  Objective:  VS:  HT:    WT:   BMI:     BP:   HR: bpm  TEMP: ( )  RESP:  Physical Exam  Musculoskeletal:  Painful range of motion and lack of rotation of the left hip.    Ortho Exam Imaging: No results found.  Past Medical/Family/Surgical/Social History: Medications & Allergies reviewed per EMR Patient Active Problem List   Diagnosis Date Noted  . 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 Pseudoarthrosis of cervical spine ([place]) 06/02/2014   Past Medical History:  Diagnosis Date  . Anxiety    takes Valium prn  . Arthritis    spinal DDD, stenosis  . Chronic low back pain   . Constipation    takes Benefiber and stool softener daily  . Depression    takes Luvox daily  . Diabetes ([place])   . Enlarged prostate    takes Uroxatral nightly  . GERD (gastroesophageal reflux disease)    takes Omeprazole daily  . Glaucoma    uses eye drops daily  . H/O hiatal  hernia   . Headache(784.0)    r/t cervical issues, occasionally has severe headache & takes NSAID & Benadryl  . History of colon polyps   . History of gastric ulcer 1967  . History of kidney stones    lithotripsyx1  . History of stress test 2010   told wnl, on treadmill  . Hyperlipidemia    takes Cholestoff at night and Fish Oil bid  . Hypertension    takes Losartan - takes in a.m. , not referred to cardiac, had stress test 6-7 yrs. ago, told wnl  . Insomnia    takes Trazodone nightly  . Nocturia   . Pneumonia    as a baby  . Shortness of breath dyspnea      due to pain & being out of shape  . Skin spots-aging   . Urinary frequency   . Urinary urgency   . Weakness    left leg and both hands   History reviewed. No pertinent family history. Past Surgical History:  Procedure Laterality Date  . ANTERIOR CERVICAL DECOMP/DISCECTOMY FUSION N/A 12/17/2012   Procedure: ANTERIOR CERVICAL DECOMPRESSION/DISCECTOMY FUSION 2 LEVELS;  Surgeon: [name], MD;  Location: [place] [facility];  Service: Neurosurgery;  Laterality: N/A;  Cervical three-four Anterior cervical decompression/diskectomy/fusion, with anterior revision of Cervical six-seven pseudoarthrosis  . ANTERIOR CERVICAL DECOMP/DISCECTOMY FUSION  1998  . ANTERIOR CERVICAL DECOMP/DISCECTOMY FUSION N/A 06/02/2014   Procedure: Cervical three-four, Cervical six-seven  Anterior Cervical Decompression/diskectomy/fusion with Iliac crest bone graft;  Surgeon: [name], MD;  Location: [place] [facility];  Service: Neurosurgery;  Laterality: N/A;  . ANTERIOR LUMBAR FUSION N/A 04/01/2013   Procedure: ANTERIOR LUMBAR FUSION 1 LEVEL;  Surgeon: [name], MD;  Location: [place] [facility];  Service: Neurosurgery;  Laterality: N/A;  Lumbar five-Sacral One Anterior lumbar interbody fusion  . BACK SURGERY  2000/2002/2007   3 lumbars, 1 cervical  with fusion  . COLONOSCOPY    . ESOPHAGOGASTRODUODENOSCOPY  4/15   esophagus stretched  . EYE SURGERY Bilateral 2008   bilateral cataracts removed, 02/2015 & 6/ 2016,  glaucoma has improved   . HEMORRHOID SURGERY  2009   x 2  . LITHOTRIPSY    . TRIGGER FINGER RELEASE Left 4-45yrs ago   middle finger   Social History   Occupational History  . Not on file  Tobacco Use  . Smoking status: Former Smoker    Packs/day: 0.25    Years: 25.00    Pack years: 6.25    Types: Cigarettes    Last attempt to quit: 05/28/1979    Years since quitting: 38.5  . Smokeless tobacco: Never Used  . Tobacco comment: quit about 63yrs ago  Substance and Sexual Activity  . Alcohol use: No     Comment: none in 23yrs   . Drug use: No  . Sexual activity: Not Currently

## 2017-11-19 NOTE — Patient Instructions (Signed)

## 2017-11-27 DIAGNOSIS — M79643 Pain in unspecified hand: Secondary | ICD-10-CM | POA: Diagnosis not present

## 2017-11-27 DIAGNOSIS — Z79899 Other long term (current) drug therapy: Secondary | ICD-10-CM | POA: Diagnosis not present

## 2017-11-27 DIAGNOSIS — R768 Other specified abnormal immunological findings in serum: Secondary | ICD-10-CM | POA: Diagnosis not present

## 2017-11-27 DIAGNOSIS — M064 Inflammatory polyarthropathy: Secondary | ICD-10-CM | POA: Diagnosis not present

## 2017-11-27 DIAGNOSIS — M1990 Unspecified osteoarthritis, unspecified site: Secondary | ICD-10-CM | POA: Diagnosis not present

## 2017-11-27 DIAGNOSIS — M25649 Stiffness of unspecified hand, not elsewhere classified: Secondary | ICD-10-CM | POA: Diagnosis not present

## 2017-11-27 DIAGNOSIS — M2550 Pain in unspecified joint: Secondary | ICD-10-CM | POA: Diagnosis not present

## 2017-11-28 ENCOUNTER — Other Ambulatory Visit (HOSPITAL_COMMUNITY): Payer: Self-pay | Admitting: Internal Medicine

## 2017-11-28 DIAGNOSIS — E119 Type 2 diabetes mellitus without complications: Secondary | ICD-10-CM | POA: Diagnosis not present

## 2017-11-28 DIAGNOSIS — E210 Primary hyperparathyroidism: Secondary | ICD-10-CM

## 2017-11-28 DIAGNOSIS — E782 Mixed hyperlipidemia: Secondary | ICD-10-CM | POA: Diagnosis not present

## 2017-12-03 ENCOUNTER — Other Ambulatory Visit (HOSPITAL_COMMUNITY): Payer: Self-pay | Admitting: Internal Medicine

## 2017-12-03 DIAGNOSIS — E210 Primary hyperparathyroidism: Secondary | ICD-10-CM

## 2017-12-03 MED ORDER — TRIAMCINOLONE ACETONIDE 40 MG/ML IJ SUSP
80.0000 mg | INTRAMUSCULAR | Status: AC | PRN
  Administered 2017-11-19: 80 mg via INTRA_ARTICULAR

## 2017-12-03 MED ORDER — BUPIVACAINE HCL 0.5 % IJ SOLN
3.0000 mL | INTRAMUSCULAR | Status: AC | PRN
  Administered 2017-11-19: 3 mL via INTRA_ARTICULAR

## 2017-12-04 ENCOUNTER — Ambulatory Visit (INDEPENDENT_AMBULATORY_CARE_PROVIDER_SITE_OTHER): Payer: PPO | Admitting: Orthopaedic Surgery

## 2017-12-05 ENCOUNTER — Ambulatory Visit (HOSPITAL_COMMUNITY): Payer: PPO

## 2017-12-05 ENCOUNTER — Other Ambulatory Visit (HOSPITAL_COMMUNITY): Payer: PPO

## 2017-12-06 ENCOUNTER — Encounter (HOSPITAL_COMMUNITY): Payer: PPO

## 2017-12-06 ENCOUNTER — Encounter (HOSPITAL_COMMUNITY)
Admission: RE | Admit: 2017-12-06 | Discharge: 2017-12-06 | Disposition: A | Discharge status: Home / Self Care | Payer: PPO | Source: Ambulatory Visit | Attending: Internal Medicine | Admitting: Internal Medicine

## 2017-12-06 DIAGNOSIS — E210 Primary hyperparathyroidism: Secondary | ICD-10-CM | POA: Insufficient documentation

## 2017-12-06 MED ORDER — TECHNETIUM TC 99M SESTAMIBI GENERIC - CARDIOLITE: 25.0000 | Freq: Once | INTRAVENOUS | Status: DC | PRN

## 2017-12-06 NOTE — Discharge Instructions (Signed)
Parathyroid Hormone Test Why am I having this test? Parathyroid hormone (PTH) is made by your parathyroid glands. These are four tiny glands that surround the larger thyroid gland in your neck. When your blood calcium levels are low, your parathyroid glands release PTH into the blood. This causes several changes in your body that result in an increase in your blood calcium level. Some of these changes include pulling calcium out of your bones and decreasing how much calcium your kidneys allow to leave your body through urination. When blood calcium levels are normal or too high, blood PTH levels decrease. Your health care provider may want you to have this test if you:  Have signs of higher-than-normal blood calcium levels (hypercalcemia).  Have signs of lower-than-normal blood calcium levels (hypocalcemia).  Have a kidney infection.  Have kidney disease.  What kind of sample is taken? A blood sample is required for this test. It is usually obtained by inserting a needle into a vein or sticking a finger with a small needle. How do I prepare for this test? Do not eat or drink anything after midnight on the night before the procedure or as directed by your health care provider. What are the reference ranges? Reference rangesare considered healthy ranges established after testing a large group of healthy people. Reference ranges may vary among different people, labs, and hospitals. It is your responsibility to obtain your test results. Ask the lab or department performing the test when and how you will get your results. Reference ranges for this test are:  PTH intact (whole): 10-65 pg/mL (SI units).  PTH N-terminal: 8-24 pg/mL (SI units).  PTH C-terminal: 50-330 pg/mL (SI units).  What do the results mean? PTH test results that are higher than the reference range may indicate a number of health conditions. These may include:  Overactive parathyroid glands  (hyperparathyroidism).  Non-PTH-producing tumors.  Kidney defect that is present at birth (congenital).  Hypocalcemia.  Long-term (chronic) kidney failure.  Malabsorption syndrome. This means your body does not properly absorb nutrients such as calcium from the intestinal tract.  Vitamin D deficiency. Adequate vitamin D levels are required for calcium to be absorbed from the intestinal tract. Vitamin D deficiency can lead to low levels of calcium in the blood.  PTH test results that are lower than the reference range may indicate a number of health conditions. These may include:  Underactive parathyroid glands (hypoparathyroidism).  Hypercalcemia.  Bone tumor.  Hypercalcemia of malignancy.  Sarcoidosis.  Vitamin D intoxication.  Milk-alkali syndrome.  DiGeorge syndrome.  Talk with your health care provider to discuss your results, treatment options, and if necessary, the need for more tests. Talk with your health care provider if you have any questions about your results. Talk with your health care provider to discuss your results, treatment options, and if necessary, the need for more tests. Talk with your health care provider if you have any questions about your results. This information is not intended to replace advice given to you by your health care provider. Make sure you discuss any questions you have with your health care provider. Document Released: 10/28/2004 Document Revised: 05/31/2016 Document Reviewed: 02/16/2014 Elsevier Interactive Patient Education  [employer].

## 2017-12-07 ENCOUNTER — Encounter (INDEPENDENT_AMBULATORY_CARE_PROVIDER_SITE_OTHER): Payer: Self-pay | Admitting: Physical Medicine and Rehabilitation

## 2017-12-07 ENCOUNTER — Encounter (HOSPITAL_COMMUNITY): Admission: RE | Admit: 2017-12-07 | Payer: PPO | Source: Ambulatory Visit

## 2017-12-07 ENCOUNTER — Other Ambulatory Visit (HOSPITAL_COMMUNITY): Payer: PPO

## 2017-12-10 ENCOUNTER — Other Ambulatory Visit: Payer: Self-pay

## 2017-12-10 NOTE — Patient Outreach (Signed)
[facility]) Care Management  [place]  12/10/2017   Seth Schwartz 12/14/1935 [id number]  Subjective: Successful outreach to the patient for monthly assessment.  HIPAA verified.  The patient states that he is doing well.  He has pain and rates it at 6/10.  His  Pain medications helps relieve some of the pain.   He denies any falls.   The patient states that his blood sugar this morning was 108.  It has been running between 114-124.  His wife has changed their diet to healthier options.  The patient is unable to exercise because of his hip.  His physician told him he will need a hip replacement.  The patient states that his hip replacement will be postponed because he has problems with his parathyroid.  The physician would like to get this corrected then work on his hip.  The patient has an appointment with his primary physician for a check up and blood work.  He will get his a1c rechecked then.    Encounter Medications:  Outpatient Encounter Medications as of 12/10/2017  Medication Sig Note  . cholecalciferol (VITAMIN D) 400 units TABS tablet Take 400 Units by mouth.   . Chromium 400 MCG TABS Take by mouth 2 (two) times daily.   . Coenzyme Q10 (EQL COQ10) 300 MG CAPS Take by mouth.   . diazepam (VALIUM) 5 MG tablet Take 1 tablet (5 mg total) by mouth every 6 (six) hours as needed for muscle spasms.   . fluvoxaMINE (LUVOX) 50 MG tablet Take 50 mg by mouth 2 (two) times daily.   Marland Kitchen gabapentin (NEURONTIN) 300 MG capsule Take 300 mg by mouth 2 (two) times daily.   Marland Kitchen HYDROcodone-acetaminophen (NORCO) 10-325 MG tablet    . hydroxychloroquine (PLAQUENIL) 200 MG tablet Take 200 mg by mouth 2 (two) times daily.   . metFORMIN (GLUCOPHAGE) 500 MG tablet    . Omega-3 Fatty Acids (FISH OIL EXTRA STRENGTH PO) Take 1,500 mg by mouth daily. 04/19/2015: Has stopped for procedure, will resume afterwards  . polyethylene glycol (MIRALAX / GLYCOLAX) packet Take 17 g by mouth every  evening.    . SUPER B COMPLEX/C PO Take 1 tablet by mouth daily.   . timolol (BETIMOL) 0.5 % ophthalmic solution Place 1 drop into both eyes 2 (two) times daily. 04/19/2015: Pt unsure if med will be continued, has Dr apt on 04/22/15 and will know then  . atorvastatin (LIPITOR) 20 MG tablet    . DULoxetine (CYMBALTA) 60 MG capsule Take 60 mg by mouth daily before breakfast.    . losartan (COZAAR) 50 MG tablet Take 50 mg by mouth daily after breakfast.    . Magnesium 400 MG TABS Take 400 mg by mouth daily. 04/19/2015: Has stopped for procedure, will resume afterwards  . Misc Natural Products (PROSTATE HEALTH PO) Take 1 capsule by mouth daily with supper. TRUNATURE PROSTATE 04/19/2015: Has stopped for procedure, will resume afterwards  . Multiple Vitamin (MULTIVITAMIN WITH MINERALS) TABS Take 1 tablet by mouth daily. 04/19/2015: Has stopped for procedure, will resume afterwards  . oxyCODONE-acetaminophen (PERCOCET) 10-325 MG per tablet Take 1 tablet by mouth 3 (three) times daily as needed for pain.   . Plant Sterols and Stanols (CHOLEST OFF PO) Take 900 mg by mouth daily. 04/20/2015: Holding for surgery- last dose taken 04/16/2015  . tamsulosin (FLOMAX) 0.4 MG CAPS capsule Take 0.4 mg by mouth daily.   . vitamin B-12 (CYANOCOBALAMIN) 1000 MCG tablet Take 1,000 mcg  by mouth daily. 04/19/2015: Has stopped for procedure, will resume afterwards   Facility-Administered Encounter Medications as of 12/10/2017  Medication  . technetium sestamibi generic (CARDIOLITE) injection 25 millicurie    Functional Status:  In your present state of health, do you have any difficulty performing the following activities: 11/09/2017  Hearing? N  Difficulty concentrating or making decisions? N  Walking or climbing stairs? Y  Dressing or bathing? N  Doing errands, shopping? N  Preparing Food and eating ? N  Using the Toilet? N  In the past six months, have you accidently leaked urine? N  Do you have problems with loss of bowel  control? N  Managing your Medications? N  Managing your Finances? N  Housekeeping or managing your Housekeeping? N  Some recent data might be hidden    Fall/Depression Screening: Fall Risk  12/10/2017 11/09/2017  Falls in the past year? No No   PHQ 2/9 Scores 11/09/2017 10/30/2017  PHQ - 2 Score 1 1    Assessment: Patient continues to benefit from health coach outreach for disease management and support.    THN CM Care Plan Problem One     Most Recent Value  THN Long Term Goal   In 90 days the patient will verbalize that he has lowered his a1c by 1-2 points  Interventions for Problem One Long Term Goal  [facility] talked with the patient about his diet, medication and exercise and how all of these affect his a1c.  THN CM Short Term Goal #1   In 30 days the patient will verbalize that he is recording his blood sugars daily and foods if high.  THN CM Short Term Goal #1 Start Date  12/10/17  [facility] CM Short Term Goal #1 Met Date  12/10/17  Interventions for Short Term Goal #1  [facility] dicussed with the patient his blood sugar values that he records daily.  THN CM Short Term Goal #2   In 30 days the patient will verbalize that he has added some exercise to his daily routine.  THN CM Short Term Goal #2 Start Date  12/10/17  [facility] CM Short Term Goal #2 Met Date  12/10/17  Interventions for Short Term Goal #2  [facility] dicussed with the patient about exercise and he has increased a small amoount but unable to d a lot due to his hps.     Plan: RN Health Coach will contact patient in the month of April and patient agrees to next outreach.  [name] RN, BSN, [place] Direct Dial:  [phone] Fax: [phone]

## 2017-12-11 DIAGNOSIS — E291 Testicular hypofunction: Secondary | ICD-10-CM | POA: Diagnosis not present

## 2017-12-11 DIAGNOSIS — M130 Polyarthritis, unspecified: Secondary | ICD-10-CM | POA: Diagnosis not present

## 2017-12-11 DIAGNOSIS — R739 Hyperglycemia, unspecified: Secondary | ICD-10-CM | POA: Diagnosis not present

## 2017-12-11 DIAGNOSIS — R61 Generalized hyperhidrosis: Secondary | ICD-10-CM | POA: Diagnosis not present

## 2017-12-11 DIAGNOSIS — E1142 Type 2 diabetes mellitus with diabetic polyneuropathy: Secondary | ICD-10-CM | POA: Diagnosis not present

## 2017-12-11 DIAGNOSIS — F419 Anxiety disorder, unspecified: Secondary | ICD-10-CM | POA: Diagnosis not present

## 2017-12-11 DIAGNOSIS — E1165 Type 2 diabetes mellitus with hyperglycemia: Secondary | ICD-10-CM | POA: Diagnosis not present

## 2017-12-11 DIAGNOSIS — Z7984 Long term (current) use of oral hypoglycemic drugs: Secondary | ICD-10-CM | POA: Diagnosis not present

## 2017-12-11 DIAGNOSIS — Z0000 Encounter for general adult medical examination without abnormal findings: Secondary | ICD-10-CM | POA: Diagnosis not present

## 2017-12-11 DIAGNOSIS — E782 Mixed hyperlipidemia: Secondary | ICD-10-CM | POA: Diagnosis not present

## 2017-12-11 DIAGNOSIS — M25549 Pain in joints of unspecified hand: Secondary | ICD-10-CM | POA: Diagnosis not present

## 2017-12-11 DIAGNOSIS — M479 Spondylosis, unspecified: Secondary | ICD-10-CM | POA: Diagnosis not present

## 2017-12-11 DIAGNOSIS — N400 Enlarged prostate without lower urinary tract symptoms: Secondary | ICD-10-CM | POA: Diagnosis not present

## 2017-12-11 DIAGNOSIS — I10 Essential (primary) hypertension: Secondary | ICD-10-CM | POA: Diagnosis not present

## 2017-12-11 DIAGNOSIS — M25552 Pain in left hip: Secondary | ICD-10-CM | POA: Diagnosis not present

## 2017-12-11 DIAGNOSIS — D751 Secondary polycythemia: Secondary | ICD-10-CM | POA: Diagnosis not present

## 2017-12-11 DIAGNOSIS — Z125 Encounter for screening for malignant neoplasm of prostate: Secondary | ICD-10-CM | POA: Diagnosis not present

## 2017-12-13 ENCOUNTER — Other Ambulatory Visit: Payer: Self-pay

## 2017-12-13 NOTE — Patient Outreach (Signed)
[facility]) Care Management  12/13/2017  Seth Schwartz 22-Oct-1935 [id number]   Received call from the patient asking for advice. HIPAA verified.  The patient called and asked for advice about a scan he had done on his parathyroid. The patient's physician is off today and is confused about his situation.  He is starting to worry what is happening with him.  RN Health Coach talked with the patient and advised him to call the office and tell them what he explained to  me and ask if he can have some one talk with him so he will not worry.  I explained that  He may have to make an appointment with his physician next week to be more in depth about his care.  The patient verbalized understanding.   Plan:  RN Health Coach will outreach the patient at our next scheduled interval.  [name] RN, BSN, [place]:  [phone] Fax: [phone]

## 2018-01-02 DIAGNOSIS — E210 Primary hyperparathyroidism: Secondary | ICD-10-CM | POA: Diagnosis not present

## 2018-01-03 DIAGNOSIS — D485 Neoplasm of uncertain behavior of skin: Secondary | ICD-10-CM | POA: Diagnosis not present

## 2018-01-03 DIAGNOSIS — C4441 Basal cell carcinoma of skin of scalp and neck: Secondary | ICD-10-CM | POA: Diagnosis not present

## 2018-01-03 DIAGNOSIS — L570 Actinic keratosis: Secondary | ICD-10-CM | POA: Diagnosis not present

## 2018-01-03 DIAGNOSIS — D2239 Melanocytic nevi of other parts of face: Secondary | ICD-10-CM | POA: Diagnosis not present

## 2018-01-03 DIAGNOSIS — Z85828 Personal history of other malignant neoplasm of skin: Secondary | ICD-10-CM | POA: Diagnosis not present

## 2018-01-03 DIAGNOSIS — L821 Other seborrheic keratosis: Secondary | ICD-10-CM | POA: Diagnosis not present

## 2018-01-07 ENCOUNTER — Other Ambulatory Visit: Payer: Self-pay | Admitting: Surgery

## 2018-01-07 DIAGNOSIS — E210 Primary hyperparathyroidism: Secondary | ICD-10-CM

## 2018-01-10 ENCOUNTER — Other Ambulatory Visit: Payer: Self-pay

## 2018-01-10 ENCOUNTER — Ambulatory Visit
Admission: RE | Admit: 2018-01-10 | Discharge: 2018-01-10 | Disposition: A | Discharge status: Home / Self Care | Payer: PPO | Source: Ambulatory Visit | Attending: Surgery | Admitting: Surgery

## 2018-01-10 DIAGNOSIS — E213 Hyperparathyroidism, unspecified: Secondary | ICD-10-CM | POA: Diagnosis not present

## 2018-01-10 DIAGNOSIS — E210 Primary hyperparathyroidism: Secondary | ICD-10-CM

## 2018-01-10 NOTE — Patient Outreach (Signed)
[facility]) Care Management  01/10/2018  Seth Schwartz [birth year] [id number]   1st unsuccessful telephone call placed to the patient for monthly assessment. No answer HIPAA compliant voicemail left with contact information.  Plan:  RN Health Coach will make an outreach attempt to the patient with in three business days.  [name] RN, BSN, [place] Direct Dial:  [phone]  Fax: [phone]

## 2018-01-10 NOTE — Patient Outreach (Signed)
[place] [facility]) Care Management  [place]  01/10/2018   Seth Schwartz 1936/03/08 [id number]  Subjective: Received call from the patient for monthly assessment. HIPAA verified. The patient states that his blood sugars have been doing good.  They have been ranging from 125-135. He has been monitoring his food and fluid intake.  The patient states that he has pain when he walks and he rates that 8/10.  When he takes his medication he rates the pain at a 5/10.  He denies having any falls. The patient states that he had a physical done last month. The patient states that he has been having some issues with his parathyroid and has an appointment today to have a scan done.    Encounter Medications:  Outpatient Encounter Medications as of 01/10/2018  Medication Sig Note  . cholecalciferol (VITAMIN D) 400 units TABS tablet Take 400 Units by mouth.   . Chromium 400 MCG TABS Take by mouth 2 (two) times daily.   . Coenzyme Q10 (EQL COQ10) 300 MG CAPS Take by mouth.   . diazepam (VALIUM) 5 MG tablet Take 1 tablet (5 mg total) by mouth every 6 (six) hours as needed for muscle spasms.   . fluvoxaMINE (LUVOX) 50 MG tablet Take 50 mg by mouth 2 (two) times daily.   Marland Kitchen gabapentin (NEURONTIN) 300 MG capsule Take 300 mg by mouth 2 (two) times daily.   Marland Kitchen HYDROcodone-acetaminophen (NORCO) 10-325 MG tablet    . hydroxychloroquine (PLAQUENIL) 200 MG tablet Take 200 mg by mouth 2 (two) times daily.   . metFORMIN (GLUCOPHAGE) 500 MG tablet    . polyethylene glycol (MIRALAX / GLYCOLAX) packet Take 17 g by mouth every evening.    . rosuvastatin (CRESTOR) 20 MG tablet Take 20 mg by mouth daily.   . SUPER B COMPLEX/C PO Take 1 tablet by mouth daily.   . timolol (BETIMOL) 0.5 % ophthalmic solution Place 1 drop into both eyes 2 (two) times daily. 04/19/2015: Pt unsure if med will be continued, has Dr apt on 04/22/15 and will know then  . vitamin B-12 (CYANOCOBALAMIN) 1000 MCG tablet Take 1,000 mcg by  mouth daily. 04/19/2015: Has stopped for procedure, will resume afterwards  . atorvastatin (LIPITOR) 20 MG tablet    . DULoxetine (CYMBALTA) 60 MG capsule Take 60 mg by mouth daily before breakfast.    . losartan (COZAAR) 50 MG tablet Take 50 mg by mouth daily after breakfast.    . Magnesium 400 MG TABS Take 400 mg by mouth daily. 04/19/2015: Has stopped for procedure, will resume afterwards  . Misc Natural Products (PROSTATE HEALTH PO) Take 1 capsule by mouth daily with supper. TRUNATURE PROSTATE 04/19/2015: Has stopped for procedure, will resume afterwards  . Multiple Vitamin (MULTIVITAMIN WITH MINERALS) TABS Take 1 tablet by mouth daily. 04/19/2015: Has stopped for procedure, will resume afterwards  . Omega-3 Fatty Acids (FISH OIL EXTRA STRENGTH PO) Take 1,500 mg by mouth daily. 04/19/2015: Has stopped for procedure, will resume afterwards  . oxyCODONE-acetaminophen (PERCOCET) 10-325 MG per tablet Take 1 tablet by mouth 3 (three) times daily as needed for pain.   . Plant Sterols and Stanols (CHOLEST OFF PO) Take 900 mg by mouth daily. 04/20/2015: Holding for surgery- last dose taken 04/16/2015  . tamsulosin (FLOMAX) 0.4 MG CAPS capsule Take 0.4 mg by mouth daily.    No facility-administered encounter medications on file as of 01/10/2018.     Functional Status:  In your present state of health,  do you have any difficulty performing the following activities: 11/09/2017  Hearing? N  Difficulty concentrating or making decisions? N  Walking or climbing stairs? Y  Dressing or bathing? N  Doing errands, shopping? N  Preparing Food and eating ? N  Using the Toilet? N  In the past six months, have you accidently leaked urine? N  Do you have problems with loss of bowel control? N  Managing your Medications? N  Managing your Finances? N  Housekeeping or managing your Housekeeping? N  Some recent data might be hidden    Fall/Depression Screening: Fall Risk  01/10/2018 12/10/2017 11/09/2017  Falls in the past  year? No No No   PHQ 2/9 Scores 11/09/2017 10/30/2017  PHQ - 2 Score 1 1    Assessment: Patient continues to benefit from health coach outreach for disease management and support.    THN CM Care Plan Problem One     Most Recent Value  THN Long Term Goal   In 90 days the patient will verbalize that he has lowered his a1c by 1-2 points  [facility] Long Term Goal Start Date  01/10/18  Interventions for Problem One Long Term Goal  [facility] reinterated with the patient about his diet and medications     Plan:  [facility] will contact patient in the month of May  and patient agrees to next outreach.  [name] RN, BSN, [place] Direct Dial:  [phone]  Fax: [phone]

## 2018-01-14 ENCOUNTER — Ambulatory Visit: Payer: Self-pay

## 2018-01-15 ENCOUNTER — Other Ambulatory Visit: Payer: Self-pay | Admitting: Surgery

## 2018-01-15 DIAGNOSIS — E210 Primary hyperparathyroidism: Secondary | ICD-10-CM

## 2018-01-23 ENCOUNTER — Ambulatory Visit
Admission: RE | Admit: 2018-01-23 | Discharge: 2018-01-23 | Disposition: A | Discharge status: Home / Self Care | Payer: PPO | Source: Ambulatory Visit | Attending: Surgery | Admitting: Surgery

## 2018-01-23 DIAGNOSIS — E210 Primary hyperparathyroidism: Secondary | ICD-10-CM

## 2018-01-23 MED ORDER — IOPAMIDOL (ISOVUE-300) INJECTION 61%
75.0000 mL | Freq: Once | INTRAVENOUS | Status: AC | PRN
Start: 2018-01-23 — End: 2018-01-23
  Administered 2018-01-23: 75 mL via INTRAVENOUS

## 2018-01-30 ENCOUNTER — Ambulatory Visit: Payer: Self-pay | Admitting: Surgery

## 2018-01-30 DIAGNOSIS — E210 Primary hyperparathyroidism: Secondary | ICD-10-CM | POA: Diagnosis not present

## 2018-02-04 DIAGNOSIS — E119 Type 2 diabetes mellitus without complications: Secondary | ICD-10-CM | POA: Diagnosis not present

## 2018-02-07 ENCOUNTER — Encounter (HOSPITAL_COMMUNITY): Payer: Self-pay

## 2018-02-07 NOTE — Patient Instructions (Addendum)
Your procedure is scheduled on: Friday, Feb 15, 2018    Report to [facility] Main  Entrance    Report to admitting at 8:00 AM   Call this number if you have problems the morning of surgery [phone]   Do not eat food or drink liquids :After Midnight.   Do NOT smoke after Midnight   Take these medicines the morning of surgery with A SIP OF WATER: Fluvoxamine, Gabapentin, Rosuvastatin   DO NOT TAKE ANY DIABETIC MEDICATIONS DAY OF YOUR SURGERY!                               You may not have any metal on your body including jewelry, and body piercings             Do not wear lotions, powders, perfumes/cologne, or deodorant                          Men may shave face and neck.   Do not bring valuables to the hospital. [place].   Contacts, dentures or bridgework may not be worn into surgery.   Leave suitcase in the car. After surgery it may be brought to your room.               Please read over the following fact sheets you were given:   [facility] - Preparing for Surgery Before surgery, you can play an important role.  Because skin is not sterile, your skin needs to be as free of germs as possible.  You can reduce the number of germs on your skin by washing with CHG (chlorahexidine gluconate) soap before surgery.  CHG is an antiseptic cleaner which kills germs and bonds with the skin to continue killing germs even after washing. Please DO NOT use if you have an allergy to CHG or antibacterial soaps.  If your skin becomes reddened/irritated stop using the CHG and inform your nurse when you arrive at Short Stay. Do not shave (including legs and underarms) for at least 48 hours prior to the first CHG shower.  You may shave your face/neck.  Please follow these instructions carefully:  1.  Shower with CHG Soap the night before surgery and the  morning of surgery.  2.  If you choose to wash your hair, wash your hair  first as usual with your normal  shampoo.  3.  After you shampoo, rinse your hair and body thoroughly to remove the shampoo.                             4.  Use CHG as you would any other liquid soap.  You can apply chg directly to the skin and wash.  Gently with a scrungie or clean washcloth.  5.  Apply the CHG Soap to your body ONLY FROM THE NECK DOWN.   Do   not use on face/ open                           Wound or open sores. Avoid contact with eyes, ears mouth and   genitals (private parts).  Wash face,  Genitals (private parts) with your normal soap.             6.  Wash thoroughly, paying special attention to the area where your    surgery  will be performed.  7.  Thoroughly rinse your body with warm water from the neck down.  8.  DO NOT shower/wash with your normal soap after using and rinsing off the CHG Soap.                9.  Pat yourself dry with a clean towel.            10.  Wear clean pajamas.            11.  Place clean sheets on your bed the night of your first shower and do not  sleep with pets. Day of Surgery : Do not apply any lotions/deodorants the morning of surgery.  Please wear clean clothes to the hospital/surgery center.  FAILURE TO FOLLOW THESE INSTRUCTIONS MAY RESULT IN THE CANCELLATION OF YOUR SURGERY  PATIENT SIGNATURE_________________________________  NURSE SIGNATURE__________________________________  ________________________________________________________________________

## 2018-02-07 NOTE — Pre-Procedure Instructions (Signed)
The following are in the patient's hard chart: Last office visit note Dr. Garnet Koyanagi 11/28/2017 Last office visit note Dr. Jacelyn Grip 12/11/2017 Hgb A1C (6.7) 12/11/2017

## 2018-02-10 ENCOUNTER — Encounter (HOSPITAL_COMMUNITY): Payer: Self-pay | Admitting: Surgery

## 2018-02-10 DIAGNOSIS — E210 Primary hyperparathyroidism: Secondary | ICD-10-CM | POA: Diagnosis present

## 2018-02-10 NOTE — H&P (Signed)
General Surgery [facility]  Beverly Gust Patient #: [id number] DOB: [birth year] Married / Language: English / Race: White Male   History of Present Illness  The patient is a 82 year old male who presents with primary hyperparathyroidism.  CHIEF COMPLAINT: Primary hyperparathyroidism  Patient returns to discuss diagnostic studies including nuclear medicine parathyroid scan, ultrasound examination of the neck, and 4D CT scan of the neck with parathyroid protocol. All of these studies were negative for parathyroid adenoma. The patient does have convincing evidence of primary hyperparathyroidism by his biochemical markers. He returns today accompanied by his wife to discuss his results and to make plans for further management.   Allergies Penicillins  Hives. Salicylates  Allergies Reconciled   Medication History Timolol Maleate (0.5% Solution, Ophthalmic) Active. OneTouch Delica Lancets Fine Active. OneTouch Verio (w/Device Kit,) Active. Crestor (20MG Tablet, 1 Oral daily) Active. Curcumin 95 (500MG Capsule, 1 Oral daily) Active. DiazePAM (2MG Tablet, Oral) Active. DiazePAM (5MG Tablet, 1/2 Oral two times daily) Active. Fish Oil Pearls (150MG Capsule, 1 Oral daily) Active. FluvoxaMINE Maleate (50MG Tablet, 1 Oral two times daily) Active. Gabapentin (300MG Capsule, 1 Oral two times daily) Active. Hydroxychloroquine Sulfate (200MG Tablet, 1 Oral daily) Active. MetFORMIN HCl (500MG Tablet, 1 Oral daily) Active. MiraLax (Oral) Active. Papaya Enzyme (Oral) Active. Stool Softener (240MG Capsule, Oral) Active. Super B Complex (Oral) Active. Medications Reconciled  Vitals Weight: 201.19 lb Height: 70in Body Surface Area: 2.09 m Body Mass Index: 28.87 kg/m  Temp.: 98.37F(Oral)  Pulse: 84 (Regular)  BP: 134/70 (Sitting, Right Arm, Standard)  Physical Exam  Limited examination  Anterior cervical region shows a  well-healed scar in the left anterior neck measuring approximately 3.5 cm in length. No palpable abnormality. Voice quality is essentially normal at conversational level.    Assessment & Plan  PRIMARY HYPERPARATHYROIDISM (E21.0) HYPERCALCEMIA (E83.52)  Patient presents today accompanied by his wife to discuss his test results and make a decision regarding strategies for further management.  I have recommended proceeding with neck exploration. This would involve general anesthesia. We would plan a traditional thyroid-type incision. We would plan to explore both sides of the neck in hopes of identifying all 4 parathyroid glands and removing any gland that was suspicious of being a parathyroid adenoma. I explained to them that this procedure is usually successful in finding the adenoma. However there is a chance that the patient has an ectopic parathyroid gland that will not be found upon neck exploration. We discussed the hospital stay to be anticipated. We discussed his postoperative recovery and potential for postoperative complications. They understand and wish to proceed with surgery in the near future.  The risks and benefits of the procedure have been discussed at length with the patient. The patient understands the proposed procedure, potential alternative treatments, and the course of recovery to be expected. All of the patient's questions have been answered at this time. The patient wishes to proceed with surgery.  [name], MD [facility] Office: [phone]   PREVIOUS DICTATION:  Beverly Gust Documented: 01/02/2018 10:19 AM Location: [facility] Patient #: [id number] DOB: Feb 16, 1936 Married / Language: Cleophus Molt / Race: White Male   History of Present Illness Earnstine Regal MD; 01/02/2018 10:59 AM) The patient is a 82 year old male who presents with primary hyperparathyroidism.  CC: hypercalcemia, suspect primary  hyperparathyroidism  Patient is referred by Dr. Boyce Medici at [facility] for surgical evaluation and management of suspected primary hyperparathyroidism. Patient's  primary care physician is Dr. Yaakov Guthrie. Patient was noted on routine laboratory studies to have a markedly elevated serum calcium level of 11.4. Patient complained of chronic fatigue. He does have a distant history of nephrolithiasis. He has no recent fractures. He reports having a bone density scan which was normal. Patient is being seen by rheumatology for degenerative joint disease and arthritis. The patient had laboratory levels showing a 24-hour urine collection with an elevated calcium level of 559. Intact PTH level were normal at 50 and 48, although we would expect these to be suppressed giving the level of calcium. Patient subsequently underwent nuclear medicine parathyroid scan on December 06, 2017. This did not localize any evidence of parathyroid adenoma. Patient has had 2 prior anterior neck procedures for spinal fusion. He does have a sister with history of parathyroid disease. He presents today accompanied by his wife for further evaluation and recommendations for management.   Past Surgical History Mammie Lorenzo, LPN; 3/71/0626 94:85 AM) Anal Fissure Repair  Hemorrhoidectomy  Vasectomy   Diagnostic Studies History Mammie Lorenzo, LPN; 4/62/7035 00:93 AM) Colonoscopy  1-5 years ago  Allergies Sabino Gasser; 01/02/2018 10:20 AM) Penicillins  Hives. Salicylates   Medication History Sabino Gasser; 01/02/2018 81:82 AM) Glory Rosebush Delica Lancets Fine Active. OneTouch Verio (w/Device Kit,) Active. Crestor (20MG Tablet, 1 Oral daily) Active. Curcumin 95 (500MG Capsule, 1 Oral daily) Active. DiazePAM (2MG Tablet, Oral) Active. DiazePAM (5MG Tablet, 1/2 Oral two times daily) Active. Fish Oil Pearls (150MG Capsule, 1 Oral daily) Active. FluvoxaMINE Maleate (50MG Tablet, 1  Oral two times daily) Active. Gabapentin (300MG Capsule, 1 Oral two times daily) Active. Hydroxychloroquine Sulfate (200MG Tablet, 1 Oral daily) Active. MetFORMIN HCl (500MG Tablet, 1 Oral daily) Active. MiraLax (Oral) Active. Papaya Enzyme (Oral) Active. Stool Softener (240MG Capsule, Oral) Active. Super B Complex (Oral) Active. Medications Reconciled  Social History Mammie Lorenzo, LPN; 9/93/7169 67:89 AM) Caffeine use  Carbonated beverages, Coffee. No alcohol use  Tobacco use  Former smoker.  Family History Mammie Lorenzo, LPN; 3/81/0175 10:25 AM) Arthritis  Mother. Colon Cancer  Mother. Depression  Mother. Melanoma  Father. Migraine Headache  Daughter. Rectal Cancer  Mother. Thyroid problems  Sister.  Other Problems Mammie Lorenzo, LPN; 8/52/7782 42:35 AM) Anxiety Disorder  Arthritis  Back Pain  Depression  Diabetes Mellitus  Gastroesophageal Reflux Disease  Hemorrhoids  Hypercholesterolemia  Kidney Stone     Review of Systems Claiborne Billings Dockery LPN; 3/61/4431 54:00 AM) General Present- Fatigue and Weight Gain. Not Present- Appetite Loss, Chills, Fever, Night Sweats and Weight Loss. Skin Present- Dryness. Not Present- Change in Wart/Mole, Hives, Jaundice, New Lesions, Non-Healing Wounds, Rash and Ulcer. HEENT Present- Nose Bleed, Oral Ulcers, Ringing in the Ears, Seasonal Allergies, Sinus Pain and Wears glasses/contact lenses. Not Present- Earache, Hearing Loss, Hoarseness, Sore Throat, Visual Disturbances and Yellow Eyes. Respiratory Present- Snoring. Not Present- Bloody sputum, Chronic Cough, Difficulty Breathing and Wheezing. Gastrointestinal Present- Constipation, Difficulty Swallowing, Excessive gas, Hemorrhoids, Indigestion and Rectal Pain. Not Present- Abdominal Pain, Bloating, Bloody Stool, Change in Bowel Habits, Chronic diarrhea, Gets full quickly at meals, Nausea and Vomiting. Male Genitourinary Present- Frequency, Impotence and  Nocturia. Not Present- Blood in Urine, Change in Urinary Stream, Painful Urination, Urgency and Urine Leakage.  Vitals Sabino Gasser; 01/02/2018 10:29 AM) 01/02/2018 10:28 AM Weight: 201.25 lb Height: 70in Body Surface Area: 2.09 m Body Mass Index: 28.88 kg/m  Temp.: 97.75F(Oral)  Pulse: 90 (Regular)  BP: 124/80 (Sitting, Left Arm, Standard)       Physical Exam (  Earnstine Regal MD; 01/02/2018 10:59 AM) The physical exam findings are as follows: Note:See vital signs recorded above  GENERAL APPEARANCE Development: normal Nutritional status: normal Gross deformities: none  SKIN Rash, lesions, ulcers: none Induration, erythema: none Nodules: none palpable  EYES Conjunctiva and lids: normal Pupils: equal and reactive Iris: normal bilaterally  EARS, NOSE, MOUTH, THROAT External ears: no lesion or deformity External nose: no lesion or deformity Hearing: grossly normal Lips: no lesion or deformity Dentition: normal for age Oral mucosa: moist  NECK Symmetric: yes Trachea: midline Thyroid: no palpable nodules in the thyroid bed Well-healed surgical incisions left anterior neck.  CHEST Respiratory effort: normal Retraction or accessory muscle use: no Breath sounds: normal bilaterally Rales, rhonchi, wheeze: none  CARDIOVASCULAR Auscultation: regular rhythm, normal rate Murmurs: none Pulses: carotid and radial pulse 2+ palpable Lower extremity edema: none Lower extremity varicosities: none  MUSCULOSKELETAL Station and gait: normal Digits and nails: no clubbing or cyanosis Muscle strength: grossly normal all extremities Range of motion: grossly normal all extremities Deformity: none  LYMPHATIC Cervical: none palpable Supraclavicular: none palpable  PSYCHIATRIC Oriented to person, place, and time: yes Mood and affect: normal for situation Judgment and insight: appropriate for situation    Assessment & Plan Earnstine Regal MD; 01/02/2018  11:02 AM) HYPERCALCEMIA (E83.52) Current Plans Pt Education - Pamphlet Given - The Parathyroid Surgery Book: discussed with patient and provided information. Follow Up - Call [facility] office after tests / studies doneto discuss further plans  Patient presents today accompanied by his wife for evaluation for suspected primary hyperparathyroidism. Patient is given written literature on parathyroid surgery to review at home.  Patient has undergone a thorough evaluation including laboratory studies, nuclear medicine study, 24-hour urine collection, and bone density scanning. Unfortunately, the nuclear medicine scan was negative as it is an approximately 20-25% of cases. My next step would be to proceed with a high-resolution ultrasound examination of the neck. Hopefully this will localize the parathyroid adenoma. We discussed the fact that most cases of primary hyperparathyroidism have a single gland adenoma in 95% of cases. There is a chance of multi-gland disease. If the ultrasound fails to reveal the adenoma, then we will obtain a 4D CT scan in hopes of defining the location of the adenoma. If the adenoma can be identified radiographically, I believe the patient will be a good candidate for outpatient minimally invasive surgery.  Patient will undergo ultrasound examination and we will contact him with those results as soon as they are available.  PRIMARY HYPERPARATHYROIDISM (E21.0)  [name], MD [facility] Main Surgery Office: [phone]

## 2018-02-12 ENCOUNTER — Encounter (HOSPITAL_COMMUNITY)
Admission: RE | Admit: 2018-02-12 | Discharge: 2018-02-12 | Disposition: A | Discharge status: Home / Self Care | Payer: PPO | Source: Ambulatory Visit | Attending: Surgery | Admitting: Surgery

## 2018-02-12 ENCOUNTER — Other Ambulatory Visit: Payer: Self-pay

## 2018-02-12 ENCOUNTER — Encounter (HOSPITAL_COMMUNITY): Payer: Self-pay

## 2018-02-12 DIAGNOSIS — E210 Primary hyperparathyroidism: Secondary | ICD-10-CM | POA: Diagnosis not present

## 2018-02-12 DIAGNOSIS — K219 Gastro-esophageal reflux disease without esophagitis: Secondary | ICD-10-CM | POA: Diagnosis not present

## 2018-02-12 DIAGNOSIS — F419 Anxiety disorder, unspecified: Secondary | ICD-10-CM | POA: Diagnosis not present

## 2018-02-12 DIAGNOSIS — E7800 Pure hypercholesterolemia, unspecified: Secondary | ICD-10-CM | POA: Diagnosis not present

## 2018-02-12 DIAGNOSIS — F329 Major depressive disorder, single episode, unspecified: Secondary | ICD-10-CM | POA: Diagnosis not present

## 2018-02-12 DIAGNOSIS — I10 Essential (primary) hypertension: Secondary | ICD-10-CM | POA: Diagnosis not present

## 2018-02-12 DIAGNOSIS — D351 Benign neoplasm of parathyroid gland: Secondary | ICD-10-CM | POA: Diagnosis not present

## 2018-02-12 DIAGNOSIS — Z79899 Other long term (current) drug therapy: Secondary | ICD-10-CM | POA: Diagnosis not present

## 2018-02-12 DIAGNOSIS — Z7984 Long term (current) use of oral hypoglycemic drugs: Secondary | ICD-10-CM | POA: Diagnosis not present

## 2018-02-12 DIAGNOSIS — E119 Type 2 diabetes mellitus without complications: Secondary | ICD-10-CM | POA: Diagnosis not present

## 2018-02-12 DIAGNOSIS — Z87891 Personal history of nicotine dependence: Secondary | ICD-10-CM | POA: Diagnosis not present

## 2018-02-12 HISTORY — DX: Personal history of other diseases of the nervous system and sense organs: Z86.69

## 2018-02-12 HISTORY — DX: Migraine, unspecified, not intractable, without status migrainosus: G43.909

## 2018-02-12 HISTORY — DX: Allergic rhinitis, unspecified: J30.9

## 2018-02-12 HISTORY — DX: Induration penis plastica: N48.6

## 2018-02-12 HISTORY — DX: Male erectile dysfunction, unspecified: N52.9

## 2018-02-12 HISTORY — DX: Melanocytic nevi, unspecified: D22.9

## 2018-02-12 HISTORY — DX: Unspecified osteoarthritis, unspecified site: M19.90

## 2018-02-12 HISTORY — DX: Hypercalcemia: E83.52

## 2018-02-12 HISTORY — DX: Polyneuropathy, unspecified: G62.9

## 2018-02-12 HISTORY — DX: Hyperparathyroidism, unspecified: E21.3

## 2018-02-12 HISTORY — DX: Adverse effect of unspecified anesthetic, initial encounter: T41.45XA

## 2018-02-12 HISTORY — DX: Other specified abnormal findings of blood chemistry: R79.89

## 2018-02-12 HISTORY — DX: Dizziness and giddiness: R42

## 2018-02-12 HISTORY — DX: Personal history of other diseases of the digestive system: Z87.19

## 2018-02-12 HISTORY — DX: Other complications of anesthesia, initial encounter: T88.59XA

## 2018-02-12 LAB — BASIC METABOLIC PANEL WITH GFR
Anion gap: 8 (ref 5–15)
BUN: 18 mg/dL (ref 6–20)
CO2: 25 mmol/L (ref 22–32)
Calcium: 11.3 mg/dL — ABNORMAL HIGH (ref 8.9–10.3)
Chloride: 109 mmol/L (ref 101–111)
Creatinine, Ser: 1.23 mg/dL (ref 0.61–1.24)
GFR calc Af Amer: >60 mL/min
GFR calc non Af Amer: 54 mL/min — ABNORMAL LOW
Glucose, Bld: 64 mg/dL — ABNORMAL LOW (ref 65–99)
Potassium: 4.9 mmol/L (ref 3.5–5.1)
Sodium: 142 mmol/L (ref 135–145)

## 2018-02-12 LAB — CBC
HCT: 41.8 % (ref 39.0–52.0)
Hemoglobin: 14.1 g/dL (ref 13.0–17.0)
MCH: 29 pg (ref 26.0–34.0)
MCHC: 33.7 g/dL (ref 30.0–36.0)
MCV: 85.8 fL (ref 78.0–100.0)
Platelets: 187 K/uL (ref 150–400)
RBC: 4.87 MIL/uL (ref 4.22–5.81)
RDW: 13.3 % (ref 11.5–15.5)
WBC: 6 K/uL (ref 4.0–10.5)

## 2018-02-12 LAB — GLUCOSE, CAPILLARY: Glucose-Capillary: 117 mg/dL — ABNORMAL HIGH (ref 65–99)

## 2018-02-12 LAB — HEMOGLOBIN A1C
Hgb A1c MFr Bld: 6.7 % — ABNORMAL HIGH (ref 4.8–5.6)
Mean Plasma Glucose: 145.59 mg/dL

## 2018-02-12 MED ORDER — CIPROFLOXACIN IN D5W 400 MG/200ML IV SOLN
400.0000 mg | INTRAVENOUS | Status: DC
  Filled 2018-02-12: qty 200

## 2018-02-13 ENCOUNTER — Other Ambulatory Visit: Payer: Self-pay

## 2018-02-13 NOTE — Patient Outreach (Signed)
[facility]) Care Management  02/13/2018  Seth Schwartz January 07, 1936 [id number]   Seth Schwartz is a 82 year old gentleman with medical history which includes pseudoarthritis of the cervical pouch, s/p stenosis of gastric pouch as complication of bariatric surgery and primary hyperparathyroidism. [name] was referred to [facility] for health coaching and is being followed by my colleague [name] RN. On behalf of Ms. Cheryll Cockayne, I reached out to [name] at home today. His wife answered the phone and said [name] was sleeping but she expressed her gratitude for my call and went on to tell me about plans for neck exploration for management of possible parathyroid adenoma by Dr. [name]. Mrs. Seminara indicated that she and [name] had an appointment with Dr. Harlow Asa on 02/10/18 and were given very clear instructions for the pre- and peri-operative period by the OR team and nurses. She denies having any questions about preparation for surgery and gladly accepted my offer to call again next week to check on [name] and his progress. I offered Mrs. Symanski my contact information and encouraged her to call me if she or [name] have any questions or needs between now and when I reach out next week.   Plan: I will reach out to [name] by phone next week to assess ongoing need and provide health coaching services.   [facility] CM Care Plan Problem Two     Most Recent Value  Care Plan Problem Two  Knowledge Deficits related to post surgical care and needs  (Pended)   Role Documenting the Problem Two  Health Coach  (Pended)   [place] for Problem Two  Active  (Pended)         [facility]  [phone]

## 2018-02-15 ENCOUNTER — Ambulatory Visit (HOSPITAL_COMMUNITY): Payer: PPO | Admitting: Anesthesiology

## 2018-02-15 ENCOUNTER — Observation Stay (HOSPITAL_COMMUNITY)
Admission: RE | Admit: 2018-02-15 | Discharge: 2018-02-16 | Disposition: A | Discharge status: Home / Self Care | Payer: PPO | Source: Ambulatory Visit | Attending: Surgery | Admitting: Surgery

## 2018-02-15 ENCOUNTER — Other Ambulatory Visit: Payer: Self-pay

## 2018-02-15 ENCOUNTER — Encounter (HOSPITAL_COMMUNITY): Payer: Self-pay | Admitting: *Deleted

## 2018-02-15 ENCOUNTER — Encounter (HOSPITAL_COMMUNITY)
Admission: RE | Disposition: A | Discharge status: Home / Self Care | Payer: Self-pay | Source: Ambulatory Visit | Attending: Surgery

## 2018-02-15 DIAGNOSIS — Z79899 Other long term (current) drug therapy: Secondary | ICD-10-CM | POA: Diagnosis not present

## 2018-02-15 DIAGNOSIS — I10 Essential (primary) hypertension: Secondary | ICD-10-CM | POA: Diagnosis not present

## 2018-02-15 DIAGNOSIS — Z87891 Personal history of nicotine dependence: Secondary | ICD-10-CM | POA: Diagnosis not present

## 2018-02-15 DIAGNOSIS — E119 Type 2 diabetes mellitus without complications: Secondary | ICD-10-CM | POA: Insufficient documentation

## 2018-02-15 DIAGNOSIS — E210 Primary hyperparathyroidism: Secondary | ICD-10-CM

## 2018-02-15 DIAGNOSIS — D351 Benign neoplasm of parathyroid gland: Principal | ICD-10-CM | POA: Insufficient documentation

## 2018-02-15 DIAGNOSIS — Z7984 Long term (current) use of oral hypoglycemic drugs: Secondary | ICD-10-CM | POA: Insufficient documentation

## 2018-02-15 DIAGNOSIS — F419 Anxiety disorder, unspecified: Secondary | ICD-10-CM | POA: Insufficient documentation

## 2018-02-15 DIAGNOSIS — F329 Major depressive disorder, single episode, unspecified: Secondary | ICD-10-CM | POA: Diagnosis not present

## 2018-02-15 DIAGNOSIS — E7800 Pure hypercholesterolemia, unspecified: Secondary | ICD-10-CM | POA: Insufficient documentation

## 2018-02-15 DIAGNOSIS — K219 Gastro-esophageal reflux disease without esophagitis: Secondary | ICD-10-CM | POA: Diagnosis not present

## 2018-02-15 HISTORY — PX: PARATHYROID EXPLORATION: SHX732

## 2018-02-15 LAB — GLUCOSE, CAPILLARY
Glucose-Capillary: 131 mg/dL — ABNORMAL HIGH (ref 65–99)
Glucose-Capillary: 127 mg/dL — ABNORMAL HIGH (ref 65–99)

## 2018-02-15 SURGERY — EXPLORATION, PARATHYROID
Anesthesia: General | Site: Neck

## 2018-02-15 MED ORDER — CHLORHEXIDINE GLUCONATE CLOTH 2 % EX PADS: 6.0000 | MEDICATED_PAD | Freq: Once | CUTANEOUS | Status: DC

## 2018-02-15 MED ORDER — 0.9 % SODIUM CHLORIDE (POUR BTL) OPTIME
TOPICAL | Status: DC | PRN
  Administered 2018-02-15: 1000 mL

## 2018-02-15 MED ORDER — DEXAMETHASONE SODIUM PHOSPHATE 10 MG/ML IJ SOLN
INTRAMUSCULAR | Status: DC | PRN
  Administered 2018-02-15: 10 mg via INTRAVENOUS

## 2018-02-15 MED ORDER — LIDOCAINE 2% (20 MG/ML) 5 ML SYRINGE
INTRAMUSCULAR | Status: AC
  Filled 2018-02-15: qty 5

## 2018-02-15 MED ORDER — ONDANSETRON HCL 4 MG/2ML IJ SOLN
INTRAMUSCULAR | Status: DC | PRN
  Administered 2018-02-15 (×2): 4 mg via INTRAVENOUS

## 2018-02-15 MED ORDER — HYDROMORPHONE HCL 1 MG/ML IJ SOLN
INTRAMUSCULAR | Status: AC
  Administered 2018-02-15: 0.5 mg via INTRAVENOUS
  Filled 2018-02-15: qty 1

## 2018-02-15 MED ORDER — MEPERIDINE HCL 50 MG/ML IJ SOLN: 6.2500 mg | INTRAMUSCULAR | Status: DC | PRN

## 2018-02-15 MED ORDER — FENTANYL CITRATE (PF) 100 MCG/2ML IJ SOLN
INTRAMUSCULAR | Status: AC
  Filled 2018-02-15: qty 2

## 2018-02-15 MED ORDER — ACETAMINOPHEN 325 MG PO TABS
650.0000 mg | ORAL_TABLET | Freq: Four times a day (QID) | ORAL | Status: DC | PRN
  Administered 2018-02-15: 650 mg via ORAL
  Filled 2018-02-15: qty 2

## 2018-02-15 MED ORDER — FENTANYL CITRATE (PF) 100 MCG/2ML IJ SOLN
INTRAMUSCULAR | Status: DC | PRN
  Administered 2018-02-15: 50 ug via INTRAVENOUS
  Administered 2018-02-15: 150 ug via INTRAVENOUS

## 2018-02-15 MED ORDER — HYDROCODONE-ACETAMINOPHEN 5-325 MG PO TABS: 1.0000 | ORAL_TABLET | ORAL | 0 refills | Status: DC | PRN

## 2018-02-15 MED ORDER — KCL IN DEXTROSE-NACL 20-5-0.45 MEQ/L-%-% IV SOLN
INTRAVENOUS | Status: DC
  Administered 2018-02-15 – 2018-02-16 (×2): via INTRAVENOUS
  Filled 2018-02-15 (×3): qty 1000

## 2018-02-15 MED ORDER — FAMOTIDINE 20 MG PO TABS
20.0000 mg | ORAL_TABLET | Freq: Every day | ORAL | Status: DC
  Administered 2018-02-15: 20 mg via ORAL
  Filled 2018-02-15: qty 1

## 2018-02-15 MED ORDER — SUGAMMADEX SODIUM 200 MG/2ML IV SOLN
INTRAVENOUS | Status: DC | PRN
  Administered 2018-02-15: 200 mg via INTRAVENOUS

## 2018-02-15 MED ORDER — METFORMIN HCL 500 MG PO TABS
500.0000 mg | ORAL_TABLET | Freq: Every day | ORAL | Status: DC
  Filled 2018-02-15: qty 1

## 2018-02-15 MED ORDER — CIPROFLOXACIN IN D5W 400 MG/200ML IV SOLN
400.0000 mg | INTRAVENOUS | Status: AC
  Administered 2018-02-15: 400 mg via INTRAVENOUS
  Filled 2018-02-15: qty 200

## 2018-02-15 MED ORDER — BUPIVACAINE HCL (PF) 0.25 % IJ SOLN
INTRAMUSCULAR | Status: DC | PRN
  Administered 2018-02-15: 15 mL

## 2018-02-15 MED ORDER — ONDANSETRON HCL 4 MG/2ML IJ SOLN: 4.0000 mg | Freq: Once | INTRAMUSCULAR | Status: DC | PRN

## 2018-02-15 MED ORDER — LIDOCAINE 2% (20 MG/ML) 5 ML SYRINGE
INTRAMUSCULAR | Status: DC | PRN
  Administered 2018-02-15: 100 mg via INTRAVENOUS

## 2018-02-15 MED ORDER — DIAZEPAM 5 MG PO TABS
2.5000 mg | ORAL_TABLET | Freq: Every day | ORAL | Status: DC
  Administered 2018-02-15: 2.5 mg via ORAL
  Filled 2018-02-15: qty 1

## 2018-02-15 MED ORDER — GABAPENTIN 300 MG PO CAPS
300.0000 mg | ORAL_CAPSULE | Freq: Two times a day (BID) | ORAL | Status: DC
  Administered 2018-02-15 – 2018-02-16 (×3): 300 mg via ORAL
  Filled 2018-02-15 (×3): qty 1

## 2018-02-15 MED ORDER — BUPIVACAINE HCL (PF) 0.25 % IJ SOLN
INTRAMUSCULAR | Status: AC
  Filled 2018-02-15: qty 30

## 2018-02-15 MED ORDER — ONDANSETRON 4 MG PO TBDP: 4.0000 mg | ORAL_TABLET | Freq: Four times a day (QID) | ORAL | Status: DC | PRN

## 2018-02-15 MED ORDER — HYDROMORPHONE HCL 1 MG/ML IJ SOLN
0.5000 mg | INTRAMUSCULAR | Status: AC | PRN
  Administered 2018-02-15 (×4): 0.5 mg via INTRAVENOUS

## 2018-02-15 MED ORDER — TRAMADOL HCL 50 MG PO TABS: 50.0000 mg | ORAL_TABLET | Freq: Four times a day (QID) | ORAL | Status: DC | PRN

## 2018-02-15 MED ORDER — ROCURONIUM BROMIDE 10 MG/ML (PF) SYRINGE
PREFILLED_SYRINGE | INTRAVENOUS | Status: DC | PRN
  Administered 2018-02-15: 50 mg via INTRAVENOUS
  Administered 2018-02-15: 10 mg via INTRAVENOUS

## 2018-02-15 MED ORDER — EPHEDRINE SULFATE-NACL 50-0.9 MG/10ML-% IV SOSY
PREFILLED_SYRINGE | INTRAVENOUS | Status: DC | PRN
  Administered 2018-02-15: 10 mg via INTRAVENOUS
  Administered 2018-02-15 (×3): 5 mg via INTRAVENOUS
  Administered 2018-02-15: 10 mg via INTRAVENOUS

## 2018-02-15 MED ORDER — HYDROMORPHONE HCL 1 MG/ML IJ SOLN: 1.0000 mg | INTRAMUSCULAR | Status: DC | PRN

## 2018-02-15 MED ORDER — HYDROMORPHONE HCL 1 MG/ML IJ SOLN
0.2500 mg | INTRAMUSCULAR | Status: DC | PRN
  Administered 2018-02-15: 0.5 mg via INTRAVENOUS
  Administered 2018-02-15: 0.25 mg via INTRAVENOUS
  Administered 2018-02-15 (×2): 0.5 mg via INTRAVENOUS
  Administered 2018-02-15: 0.25 mg via INTRAVENOUS

## 2018-02-15 MED ORDER — PROPOFOL 10 MG/ML IV BOLUS
INTRAVENOUS | Status: DC | PRN
  Administered 2018-02-15: 150 mg via INTRAVENOUS

## 2018-02-15 MED ORDER — HYDROMORPHONE HCL 1 MG/ML IJ SOLN: 0.5000 mg | INTRAMUSCULAR | Status: DC

## 2018-02-15 MED ORDER — SUGAMMADEX SODIUM 200 MG/2ML IV SOLN
INTRAVENOUS | Status: AC
  Filled 2018-02-15: qty 4

## 2018-02-15 MED ORDER — HYDROXYCHLOROQUINE SULFATE 200 MG PO TABS
200.0000 mg | ORAL_TABLET | Freq: Two times a day (BID) | ORAL | Status: DC
  Administered 2018-02-15 – 2018-02-16 (×2): 200 mg via ORAL
  Filled 2018-02-15 (×2): qty 1

## 2018-02-15 MED ORDER — TIMOLOL MALEATE 0.5 % OP SOLN
1.0000 [drp] | Freq: Every day | OPHTHALMIC | Status: DC
  Administered 2018-02-15: 1 [drp] via OPHTHALMIC
  Filled 2018-02-15: qty 5

## 2018-02-15 MED ORDER — PROPOFOL 10 MG/ML IV BOLUS
INTRAVENOUS | Status: AC
  Filled 2018-02-15: qty 20

## 2018-02-15 MED ORDER — HYDROCODONE-ACETAMINOPHEN 5-325 MG PO TABS
0.5000 | ORAL_TABLET | ORAL | Status: DC | PRN
  Administered 2018-02-15 (×2): 1 via ORAL
  Administered 2018-02-16: 2 via ORAL
  Administered 2018-02-16: 1 via ORAL
  Filled 2018-02-15 (×3): qty 1
  Filled 2018-02-15: qty 2

## 2018-02-15 MED ORDER — ACETAMINOPHEN 650 MG RE SUPP: 650.0000 mg | Freq: Four times a day (QID) | RECTAL | Status: DC | PRN

## 2018-02-15 MED ORDER — EPHEDRINE 5 MG/ML INJ
INTRAVENOUS | Status: AC
  Filled 2018-02-15: qty 10

## 2018-02-15 MED ORDER — HYDROMORPHONE HCL 1 MG/ML IJ SOLN
INTRAMUSCULAR | Status: AC
  Filled 2018-02-15: qty 1

## 2018-02-15 MED ORDER — LACTATED RINGERS IV SOLN
INTRAVENOUS | Status: DC
  Administered 2018-02-15: 08:00:00 via INTRAVENOUS

## 2018-02-15 MED ORDER — FLUVOXAMINE MALEATE 50 MG PO TABS
50.0000 mg | ORAL_TABLET | Freq: Two times a day (BID) | ORAL | Status: DC
  Administered 2018-02-15 – 2018-02-16 (×2): 50 mg via ORAL
  Filled 2018-02-15 (×2): qty 1

## 2018-02-15 MED ORDER — ONDANSETRON HCL 4 MG/2ML IJ SOLN: 4.0000 mg | Freq: Four times a day (QID) | INTRAMUSCULAR | Status: DC | PRN

## 2018-02-15 MED ORDER — ONDANSETRON HCL 4 MG/2ML IJ SOLN
INTRAMUSCULAR | Status: AC
  Filled 2018-02-15: qty 2

## 2018-02-15 SURGICAL SUPPLY — 36 items
ATTRACTOMAT 16X20 MAGNETIC DRP (DRAPES) ×3 IMPLANT
BLADE HEX COATED 2.75 (ELECTRODE) ×3 IMPLANT
BLADE SURG 15 STRL LF DISP TIS (BLADE) ×1 IMPLANT
BLADE SURG 15 STRL SS (BLADE) ×3
CHLORAPREP W/TINT 26ML (MISCELLANEOUS) ×4 IMPLANT
CLIP VESOCCLUDE MED 6/CT (CLIP) ×3 IMPLANT
CLIP VESOCCLUDE SM WIDE 6/CT (CLIP) ×3 IMPLANT
CLOSURE WOUND 1/2 X4 (GAUZE/BANDAGES/DRESSINGS) ×1
COVER SURGICAL LIGHT HANDLE (MISCELLANEOUS) ×3 IMPLANT
DISSECTOR ROUND CHERRY 3/8 STR (MISCELLANEOUS) ×3 IMPLANT
DRAPE LAPAROTOMY T 98X78 PEDS (DRAPES) ×3 IMPLANT
ELECT PENCIL ROCKER SW 15FT (MISCELLANEOUS) ×3 IMPLANT
ELECT REM PT RETURN 15FT ADLT (MISCELLANEOUS) ×3 IMPLANT
GAUZE 4X4 16PLY RFD (DISPOSABLE) ×3 IMPLANT
GAUZE SPONGE 4X4 12PLY STRL (GAUZE/BANDAGES/DRESSINGS) ×2 IMPLANT
GLOVE SURG ORTHO 8.0 STRL STRW (GLOVE) ×3 IMPLANT
GOWN STRL REUS W/TWL LRG LVL3 (GOWN DISPOSABLE) ×3 IMPLANT
GOWN STRL REUS W/TWL XL LVL3 (GOWN DISPOSABLE) ×6 IMPLANT
HEMOSTAT SURGICEL 2X4 FIBR (HEMOSTASIS) ×3 IMPLANT
ILLUMINATOR WAVEGUIDE N/F (MISCELLANEOUS) ×4 IMPLANT
KIT BASIN OR (CUSTOM PROCEDURE TRAY) ×3 IMPLANT
LIGHT WAVEGUIDE WIDE FLAT (MISCELLANEOUS) IMPLANT
NEEDLE HYPO 22GX1.5 SAFETY (NEEDLE) ×3 IMPLANT
NS IRRIG 1000ML POUR BTL (IV SOLUTION) ×3 IMPLANT
PACK BASIC VI WITH GOWN DISP (CUSTOM PROCEDURE TRAY) ×3 IMPLANT
STAPLER VISISTAT 35W (STAPLE) ×3 IMPLANT
STRIP CLOSURE SKIN 1/2X4 (GAUZE/BANDAGES/DRESSINGS) ×2 IMPLANT
SUT MNCRL AB 4-0 PS2 18 (SUTURE) ×3 IMPLANT
SUT SILK 2 0 (SUTURE) ×3
SUT SILK 2-0 18XBRD TIE 12 (SUTURE) ×1 IMPLANT
SUT SILK 3 0 (SUTURE)
SUT SILK 3-0 18XBRD TIE 12 (SUTURE) IMPLANT
SUT VIC AB 3-0 SH 18 (SUTURE) ×5 IMPLANT
SYR CONTROL 10ML LL (SYRINGE) ×3 IMPLANT
TOWEL OR 17X26 10 PK STRL BLUE (TOWEL DISPOSABLE) ×3 IMPLANT
YANKAUER SUCT BULB TIP 10FT TU (MISCELLANEOUS) ×3 IMPLANT

## 2018-02-15 NOTE — Brief Op Note (Signed)
02/15/2018  11:49 AM  PATIENT:  Seth Schwartz  82 y.o. male  PRE-OPERATIVE DIAGNOSIS:  Primary Hyperparathyroidism  POST-OPERATIVE DIAGNOSIS:  Primary Hyperparathyroidism  PROCEDURE:  Procedure(s): NECK EXPLORATION WITH RIGHT INFERIOR PARATHYROIDECTOMY AND BIOPSY LEFT INFERIOR PARATHYROID (N/A)  SURGEON:  Surgeon(s) and Role:    * [name], MD - Primary  ANESTHESIA:   general  EBL:  25 mL   BLOOD ADMINISTERED:none  DRAINS: none   LOCAL MEDICATIONS USED:  MARCAINE     SPECIMEN:  Excision  DISPOSITION OF SPECIMEN:  PATHOLOGY  COUNTS:  YES  TOURNIQUET:  * No tourniquets in log *  DICTATION: .Other Dictation: Dictation Number [id number]  PLAN OF CARE: Admit for overnight observation  PATIENT DISPOSITION:  PACU - hemodynamically stable.   Delay start of Pharmacological VTE agent (>24hrs) due to surgical blood loss or risk of bleeding: yes  [name], MD [facility] Surgery Office: [phone]

## 2018-02-15 NOTE — Interval H&P Note (Signed)
History and Physical Interval Note:  02/15/2018 9:39 AM  Seth Schwartz  has presented today for surgery, with the diagnosis of Primary Hyperparathyroidism.  The various methods of treatment have been discussed with the patient and family. After consideration of risks, benefits and other options for treatment, the patient has consented to    Procedure(s): NECK EXPLORATION WITH PARATHYROIDECTOMY (N/A) as a surgical intervention .    The patient's history has been reviewed, patient examined, no change in status, stable for surgery.  I have reviewed the patient's chart and labs.  Questions were answered to the patient's satisfaction.    [name], [facility] Office: [place]

## 2018-02-15 NOTE — Anesthesia Procedure Notes (Signed)
Procedure Name: Intubation Date/Time: 02/15/2018 10:00 AM Performed by: Lavina Hamman, CRNA Pre-anesthesia Checklist: Patient identified, Emergency Drugs available, Suction available, Patient being monitored and Timeout performed Patient Re-evaluated:Patient Re-evaluated prior to induction Oxygen Delivery Method: Circle system utilized Preoxygenation: Pre-oxygenation with 100% oxygen Induction Type: IV induction Ventilation: Mask ventilation without difficulty Laryngoscope Size: Mac and 4 Grade View: Grade II Tube type: Oral Tube size: 7.5 mm Number of attempts: 1 Airway Equipment and Method: Stylet Placement Confirmation: ETT inserted through vocal cords under direct vision,  positive ETCO2,  CO2 detector and breath sounds checked- equal and bilateral Secured at: 23 cm Tube secured with: Tape Dental Injury: Teeth and Oropharynx as per pre-operative assessment

## 2018-02-15 NOTE — Anesthesia Postprocedure Evaluation (Signed)
Anesthesia Post Note  Patient: Beverly Gust  Procedure(s) Performed: NECK EXPLORATION WITH RIGHT INFERIOR PARATHYROIDECTOMY AND BIOPSY OF LEFT INFERIOR PARATHYROID (N/A Neck)     Patient location during evaluation: PACU Anesthesia Type: General Level of consciousness: awake and alert Pain management: pain level controlled Vital Signs Assessment: post-procedure vital signs reviewed and stable Respiratory status: spontaneous breathing, nonlabored ventilation, respiratory function stable and patient connected to nasal cannula oxygen Cardiovascular status: blood pressure returned to baseline and stable Postop Assessment: no apparent nausea or vomiting Anesthetic complications: no    Last Vitals:  Vitals:   02/15/18 1315 02/15/18 1411  BP: 129/65 127/82  Pulse: 72 89  Resp: (!) 21 14  Temp:  37 C  SpO2: 98% 95%    Last Pain:  Vitals:   02/15/18 1330  TempSrc:   PainSc: 3                  Chauncey Sciulli DAVID

## 2018-02-15 NOTE — Op Note (Signed)
NAME: KIMBERLEY, DASTRUP MEDICAL RECORD NW:[id number] ACCOUNT [id number] DATE OF BIRTH:1936/08/08 FACILITY: [facility] LOCATION: Orion Crook, MD  OPERATIVE REPORT  DATE OF PROCEDURE:  02/15/2018  PREOPERATIVE DIAGNOSIS:  Primary hyperparathyroidism.  POSTOPERATIVE DIAGNOSIS:  Primary hyperparathyroidism.  PROCEDURE: 1.  Neck exploration. 2.  Right inferior parathyroidectomy. 3.  Biopsy, left inferior parathyroid gland.  SURGEON:  Earnstine Regal, MD  ANESTHESIA:  General per Dr. Lillia Abed.  ESTIMATED BLOOD LOSS:  Minimal.  PREPARATION:  ChloraPrep.  COMPLICATIONS:  None.  INDICATIONS:  The patient is a 82 year old white male referred by Dr. Boyce Medici at [facility] for evaluation and management of suspected primary hyperparathyroidism.  The patient's primary care physician is Dr. Yaakov Guthrie.   Routine laboratory studies showed an elevated calcium level of 11.4.  The patient complains of chronic fatigue.  A 24-hour urine collection for calcium was markedly elevated at 559.  Intact PTH levels, however, were in the normal range at 48 and at 50 on  repeat analysis.  The patient underwent nuclear medicine parathyroid scan, which was negative for evidence of parathyroid adenoma.  The patient subsequently underwent ultrasound examination and 4D CT scan with parathyroid protocol, both of which also  failed to reveal evidence of parathyroid adenoma.  The patient now comes to surgery for neck exploration.  PROCEDURE PERFORMED:  Procedure was done in OR #1 at the [facility].  The patient was brought to the operating room and placed in a supine position on the operating room table.  Following administration of general anesthesia, the  patient was positioned and then prepped and draped in the usual aseptic fashion.  After ascertaining an adequate level of anesthesia had been achieved, a Kocher incision was made with a #15 blade.   Dissection was carried through subcutaneous tissues and  platysma.  Subplatysmal flaps were elevated cephalad and caudad from the thyroid notch to the sternal notch.  A Mahorner self-retaining retractor was placed for exposure.  Strap muscles were incised in the midline.  Dissection was begun on the left side.   The patient had had previous carotid endarterectomy as well as anterior cervical fusion.  There was a moderate amount of scar tissue present on the left.  After mobilizing the strap muscles, the left thyroid lobe was dissected out.  It was mobilized.   This allowed for exploration along the lateral and posterior aspect of the left thyroid lobe.  This included the inferior portion of the gland and the thyrothymic tract.  A normal-appearing parathyroid gland was identified in the superior position.   Likewise, a normal-appearing gland was identified at the origin of the thyrothymic tract.  Next, we explored the right neck.  The strap muscles were again reflected laterally.  The right thyroid lobe was mobilized.  Exploration around the superior pole failed to reveal any parathyroid tissue.  Exploration inferiorly just below the inferior  pole of the thyroid gland revealed an enlarged parathyroid gland measuring approximately 1 cm in greatest dimension consistent with a small parathyroid adenoma.  This was dissected out.  Vascular pedicle was divided between small Ligaclips, and the gland  was submitted to pathology for review.  Dr. Joya Martyr did a frozen section and confirmed parathyroid tissue which appeared hypercellular and was felt to be consistent with parathyroid adenoma.  A biopsy was then performed on the left inferior parathyroid gland by placing a medium Ligaclip across the midportion of the gland and excised using the end of the gland  with a #15-blade.  The specimen was submitted for frozen section.  Again, Dr. Lyndon Code  confirmed parathyroid tissue but felt that this tissue looked to be  normal.  The neck was irrigated with warm saline.  Good hemostasis was achieved throughout the operative field.  Fibrillar was placed throughout the operative field.  Strap muscles were  reapproximated in the midline of interrupted 3-0 Vicryl sutures.  The platysma was closed with interrupted 3-0 Vicryl sutures.  Skin was anesthetized with local anesthetic.  Skin edges were reapproximated with a running 4-0 Monocryl subcuticular suture.   Wound was washed and dried and Steri-Strips were applied.  Sterile dressings were applied.  The patient was awakened from anesthesia and brought to the recovery room.  The patient tolerated the procedure well.  [name], [facility] Office: [phone]    LN/NUANCE  D:02/15/2018 T:02/15/2018 JOB:[id number]

## 2018-02-15 NOTE — Discharge Instructions (Signed)
[facility]  THYROID & PARATHYROID SURGERY:  POST-OP INSTRUCTIONS  Always review your discharge instruction sheet from the facility where your surgery was performed.  A prescription for pain medication may be given to you upon discharge.  Take your pain medication as prescribed.  If narcotic pain medicine is not needed, then you may take acetaminophen (Tylenol) or ibuprofen (Advil) as needed.  Take your usually prescribed medications unless otherwise directed.  If you need a refill on your pain medication, please contact our office during regular business hours.  Prescriptions cannot be processed by our office after 5 pm or on weekends.  Start with a light diet upon arrival home, such as soup and crackers or toast.  Be sure to drink plenty of fluids daily.  Resume your normal diet the day after surgery.  Most patients will experience some swelling and bruising on the chest and neck area.  Ice packs will help.  Swelling and bruising can take several days to resolve.   It is common to experience some constipation after surgery.  Increasing fluid intake and taking a stool softener (Colace) will usually help or prevent this problem.  A mild laxative (Milk of Magnesia or Miralax) should be taken according to package directions if there has been no bowel movement after 48 hours.  You have steri-strips and a gauze dressing over your incision.  You may remove the gauze bandage on the second day after surgery, and you may shower at that time.  Leave your steri-strips (small skin tapes) in place directly over the incision.  These strips should remain on the skin for 5-7 days and then be removed.  You may get them wet in the shower and pat them dry.  You may resume regular (light) daily activities beginning the next day (such as daily self-care, walking, climbing stairs) gradually increasing activities as tolerated.  You may have sexual intercourse when it is comfortable.  Refrain from  any heavy lifting or straining until approved by your doctor.  You may drive when you no longer are taking prescription pain medication, you can comfortably wear a seatbelt, and you can safely maneuver your car and apply brakes.  You should see your doctor in the office for a follow-up appointment approximately three weeks after your surgery.  Make sure that you call for this appointment within a day or two after you arrive home to insure a convenient appointment time.  WHEN TO CALL YOUR DOCTOR: -- Fever greater than 101.5 -- Inability to urinate -- Nausea and/or vomiting - persistent -- Extreme swelling or bruising -- Continued bleeding from incision -- Increased pain, redness, or drainage from the incision -- Difficulty swallowing or breathing -- Muscle cramping or spasms -- Numbness or tingling in hands or around lips  The clinic staff is available to answer your questions during regular business hours.  Please don't hesitate to call and ask to speak to one of the nurses if you have concerns.  Chadric Kimberley, MD [facility] Office: [phone] 

## 2018-02-15 NOTE — Anesthesia Preprocedure Evaluation (Signed)
Anesthesia Evaluation  Patient identified by MRN, date of birth, ID band Patient awake    Reviewed: Allergy & Precautions, NPO status , Patient's Chart, lab work & pertinent test results  Airway Mallampati: I  TM Distance: >3 FB Neck ROM: Full    Dental   Pulmonary former smoker,    Pulmonary exam normal        Cardiovascular hypertension, Pt. on medications Normal cardiovascular exam     Neuro/Psych Anxiety Depression    GI/Hepatic GERD  Medicated and Controlled,  Endo/Other  diabetes, Type 2, Oral Hypoglycemic Agents  Renal/GU      Musculoskeletal   Abdominal   Peds  Hematology   Anesthesia Other Findings   Reproductive/Obstetrics                             Anesthesia Physical Anesthesia Plan  ASA: II  Anesthesia Plan: General   Post-op Pain Management:    Induction: Intravenous  PONV Risk Score and Plan: 2 and Ondansetron and Midazolam  Airway Management Planned: Oral ETT  Additional Equipment:   Intra-op Plan:   Post-operative Plan: Extubation in OR  Informed Consent: I have reviewed the patients History and Physical, chart, labs and discussed the procedure including the risks, benefits and alternatives for the proposed anesthesia with the patient or authorized representative who has indicated his/her understanding and acceptance.     Plan Discussed with: CRNA and Surgeon  Anesthesia Plan Comments:         Anesthesia Quick Evaluation

## 2018-02-15 NOTE — Progress Notes (Signed)
Pt ambulated unassisted to bathroom in PACU and from stretcher to bed on floor.

## 2018-02-15 NOTE — Transfer of Care (Signed)
Immediate Anesthesia Transfer of Care Note  Patient: Seth Schwartz  Procedure(s) Performed: NECK EXPLORATION WITH RIGHT INFERIOR PARATHYROIDECTOMY AND BIOPSY LEFT INFERIOR PARATHYROID (N/A Neck)  Patient Location: PACU  Anesthesia Type:General  Level of Consciousness: awake, alert  and oriented  Airway & Oxygen Therapy: Patient Spontanous Breathing and Patient connected to face mask oxygen  Post-op Assessment: Report given to RN  Post vital signs: Reviewed and stable  Last Vitals:  Vitals Value Taken Time  BP 137/79 02/15/2018 11:52 AM  Temp    Pulse 76 02/15/2018 11:53 AM  Resp 16 02/15/2018 11:53 AM  SpO2 100 % 02/15/2018 11:53 AM  Vitals shown include unvalidated device data.  Last Pain:  Vitals:   02/15/18 0750  TempSrc: Oral      Patients Stated Pain Goal: 4 (39/67/28 9791)  Complications: No apparent anesthesia complications

## 2018-02-16 ENCOUNTER — Encounter (HOSPITAL_COMMUNITY): Payer: Self-pay | Admitting: Surgery

## 2018-02-16 DIAGNOSIS — D351 Benign neoplasm of parathyroid gland: Secondary | ICD-10-CM | POA: Diagnosis not present

## 2018-02-16 LAB — BASIC METABOLIC PANEL
Anion gap: 11 (ref 5–15)
BUN: 24 mg/dL — ABNORMAL HIGH (ref 6–20)
CO2: 22 mmol/L (ref 22–32)
Calcium: 10 mg/dL (ref 8.9–10.3)
Chloride: 108 mmol/L (ref 101–111)
Creatinine, Ser: 1.3 mg/dL — ABNORMAL HIGH (ref 0.61–1.24)
GFR calc Af Amer: 59 mL/min — ABNORMAL LOW (ref >60)
GFR calc non Af Amer: 51 mL/min — ABNORMAL LOW (ref >60)
Glucose, Bld: 192 mg/dL — ABNORMAL HIGH (ref 65–99)
Potassium: 5 mmol/L (ref 3.5–5.1)
Sodium: 141 mmol/L (ref 135–145)

## 2018-02-16 NOTE — Discharge Summary (Signed)
Physician Discharge Summary  Patient ID: Seth Schwartz MRN: [id number] DOB/AGE: May 30, 1936 82 y.o.  PCP: Vernie Shanks, MD  Admit date: 02/15/2018 Discharge date: 02/16/2018  Admission Diagnoses:  hyperparathyroidism  Discharge Diagnoses:  same  Principal Problem:   Hyperparathyroidism, primary [facility]) Active Problems:   Primary hyperparathyroidism [facility])   Surgery:  Resection of parathyroid adenoma  Discharged Condition: improved  Hospital Course:   Had surgery on Friday and ready for discharge on Saturday.  Incision bland  Consults: none  Significant Diagnostic Studies: none    Discharge Exam: Blood pressure 109/65, pulse 79, temperature 97.8 F (36.6 C), temperature source Oral, resp. rate 16, height 5\' 10"  (1.778 m), weight 89.4 kg (197 lb), SpO2 97 %. Incision OK.  Voice OK  Disposition: Discharge disposition: 01-Home or Self Care       Discharge Instructions    Diet - low sodium heart healthy   Complete by:  As directed    Discharge instructions   Complete by:  As directed    Remove dressing at home and shower.  Leave steristrips in place   Increase activity slowly   Complete by:  As directed      Allergies as of 02/16/2018      Reactions   Sulfa Antibiotics Shortness Of Breath   Ace Inhibitors Cough   Penicillins Hives   Has patient had a PCN reaction causing immediate rash, facial/tongue/throat swelling, SOB or lightheadedness with hypotension: No Has patient had a PCN reaction causing severe rash involving mucus membranes or skin necrosis: Yes Has patient had a PCN reaction that required hospitalization: No Has patient had a PCN reaction occurring within the last 10 years: No If all of the above answers are "NO", then may proceed with Cephalosporin use.      Medication List    TAKE these medications   Cholecalciferol 2000 units Caps Take 2,000 Units by mouth daily.   CHROMIUM-CINNAMON PO Take 2 capsules by mouth daily.   diazepam 5 MG  tablet Commonly known as:  VALIUM Take 1 tablet (5 mg total) by mouth every 6 (six) hours as needed for muscle spasms. What changed:    how much to take  when to take this   EQL COQ10 300 MG Caps Generic drug:  Coenzyme Q10 Take 300 mg by mouth daily.   FISH OIL EXTRA STRENGTH PO Take 1,500 mg by mouth daily.   fluvoxaMINE 50 MG tablet Commonly known as:  LUVOX Take 50 mg by mouth 2 (two) times daily.   gabapentin 300 MG capsule Commonly known as:  NEURONTIN Take 300 mg by mouth 2 (two) times daily.   HYDROcodone-acetaminophen 10-325 MG tablet Commonly known as:  NORCO Take 0.5 tablets by mouth 4 (four) times daily. What changed:  Another medication with the same name was added. Make sure you understand how and when to take each.   HYDROcodone-acetaminophen 5-325 MG tablet Commonly known as:  NORCO/VICODIN Take 1-2 tablets by mouth every 4 (four) hours as needed for moderate pain. What changed:  You were already taking a medication with the same name, and this prescription was added. Make sure you understand how and when to take each.   hydroxychloroquine 200 MG tablet Commonly known as:  PLAQUENIL Take 200 mg by mouth 2 (two) times daily.   metFORMIN 500 MG tablet Commonly known as:  GLUCOPHAGE Take 500 mg by mouth daily.   PAPAYA ENZYMES PO Take 2 tablets by mouth as needed (indigestion).   polyethylene glycol  packet Commonly known as:  MIRALAX / GLYCOLAX Take 17 g by mouth every evening.   ranitidine 150 MG tablet Commonly known as:  ZANTAC Take 150 mg by mouth at bedtime.   rosuvastatin 20 MG tablet Commonly known as:  CRESTOR Take 20 mg by mouth daily.   SUPER B COMPLEX/C PO Take 1 tablet by mouth daily.   timolol 0.5 % ophthalmic solution Commonly known as:  BETIMOL Place 1 drop into both eyes at bedtime.      Follow-up Information    [name], MD. Schedule an appointment as soon as possible for a visit in 3 weeks.   Specialty:  General  Surgery Why:  For wound re-check Contact information: [place] [address] [phone]           Signed: Pedro Earls 02/16/2018, 11:29 AM

## 2018-02-18 ENCOUNTER — Other Ambulatory Visit: Payer: Self-pay

## 2018-02-18 NOTE — Patient Outreach (Signed)
[facility]) Care Management  02/18/2018  Seth Schwartz Jul 08, 1936 [id number]   RN Health Coach Post Surgical Follow Up Outreach   Outreach Attempt:  Successful telephone outreach to patient for post surgery follow up.  HIPAA verified with patient.  Patient underwent Neck exploration/right parathyroidectomy/left biopsy of parathyroid on 5/10/219.  Discharged from the hospital on 02/16/2018. Patient stating he is doing well since being at home.  Still hoarse and trying not to talk a lot.  RN Health Coach requested to continue conversation with wife, patient verbally agrees (Release of information on file).  Wife states patient's pain in controlled with prescribed pain medication and ice pack application.  Reports dressing is still on incision and they plan to remove dressing tomorrow, leaving steri strips in place and patient will shower tomorrow per physician orders.  Wife states patient has been staying hydrated with plenty of fluids and is tolerating small amounts of solid foods.  Denies any fever or drainage from incision.  Patient and wife denying any issues or concerns at this time.  Appointments:  Wife verbalizes patient has scheduled surgical follow up appointment with Dr. Harlow Asa on 03/01/2018.  States he sees his primary care provider again in July 2019.  Plan: RN Health Coach will make next monthly outreach to patient in the month of June.  [place] [phone] [email or web address]

## 2018-02-27 DIAGNOSIS — E210 Primary hyperparathyroidism: Secondary | ICD-10-CM | POA: Diagnosis not present

## 2018-03-13 ENCOUNTER — Other Ambulatory Visit: Payer: Self-pay

## 2018-03-13 NOTE — Patient Outreach (Signed)
[facility]) Care Management  03/13/2018   Seth Schwartz 1936-09-13 [id number]  Subjective: Telephone call placed to the patient for monthly assessment. HIPAA verified. The patient states that he is feeling better after his parathyroid surgery.  He is regaining some of his energy back.  He checked his  blood sugar this morning and it was 130. He is adherent with his medications   He has continued to monitor his food intake.  He still has chronic pain in his back, hip and legs.  He rates the pain at a 5/10.  He still continues to walk in his neighborhood for exercise.  He denies any falls .  The patient has a follow up with his physician in July.    Current Medications:  Current Outpatient Medications  Medication Sig Dispense Refill  . Cholecalciferol 2000 units CAPS Take 2,000 Units by mouth daily.     . CHROMIUM-CINNAMON PO Take 2 capsules by mouth daily.    . Coenzyme Q10 (EQL COQ10) 300 MG CAPS Take 300 mg by mouth daily.     . diazepam (VALIUM) 5 MG tablet Take 1 tablet (5 mg total) by mouth every 6 (six) hours as needed for muscle spasms. (Patient taking differently: Take 2.5 mg by mouth at bedtime. ) 60 tablet 0  . fluvoxaMINE (LUVOX) 50 MG tablet Take 50 mg by mouth 2 (two) times daily.    Marland Kitchen gabapentin (NEURONTIN) 300 MG capsule Take 300 mg by mouth 2 (two) times daily.    Marland Kitchen HYDROcodone-acetaminophen (NORCO/VICODIN) 5-325 MG tablet Take 1-2 tablets by mouth every 4 (four) hours as needed for moderate pain. 20 tablet 0  . hydroxychloroquine (PLAQUENIL) 200 MG tablet Take 200 mg by mouth 2 (two) times daily.    . metFORMIN (GLUCOPHAGE) 500 MG tablet Take 500 mg by mouth daily.     . Omega-3 Fatty Acids (FISH OIL EXTRA STRENGTH PO) Take 1,500 mg by mouth daily.    Marland Kitchen PAPAYA ENZYMES PO Take 2 tablets by mouth as needed (indigestion).    . polyethylene glycol (MIRALAX / GLYCOLAX) packet Take 17 g by mouth every evening.     . ranitidine (ZANTAC) 150 MG tablet Take 150 mg  by mouth at bedtime.    . rosuvastatin (CRESTOR) 20 MG tablet Take 20 mg by mouth daily.    . SUPER B COMPLEX/C PO Take 1 tablet by mouth daily.    . timolol (BETIMOL) 0.5 % ophthalmic solution Place 1 drop into both eyes at bedtime.     Marland Kitchen HYDROcodone-acetaminophen (NORCO) 10-325 MG tablet Take 0.5 tablets by mouth 4 (four) times daily.      No current facility-administered medications for this visit.     Functional Status:  In your present state of health, do you have any difficulty performing the following activities: 02/15/2018 02/12/2018  Hearing? N N  Vision? Y Y  Difficulty concentrating or making decisions? N N  Walking or climbing stairs? N N  Dressing or bathing? N N  Doing errands, shopping? N N  Preparing Food and eating ? - -  Using the Toilet? - -  In the past six months, have you accidently leaked urine? - -  Do you have problems with loss of bowel control? - -  Managing your Medications? - -  Managing your Finances? - -  Housekeeping or managing your Housekeeping? - -  Some recent data might be hidden    Fall/Depression Screening: Fall Risk  03/13/2018 01/10/2018 12/10/2017  Falls  in the past year? No No No   PHQ 2/9 Scores 11/09/2017 10/30/2017  PHQ - 2 Score 1 1    Assessment: Patient will continue to benefit from health coach outreach for disease management and support.  [facility]N CM Care Plan Problem One     Most Recent Value  Care Plan for Problem One  Active  [facility] Long Term Goal   In 90 days the patient will verbalize that he has lowered his a1c by 1-2 points  [facility] Long Term Goal Start Date  03/13/18  Interventions for Problem One Long Term Goal  review plan with the patient and his wife, reinterated monitoring blood sugars daily, diet and medication adherence     Plan: RN Health Coach will contact patient in the month of July and patient agrees to next outreach.  [name] RN, BSN, [place] Direct Dial:   [phone]  Fax: [phone]

## 2018-04-16 DIAGNOSIS — E291 Testicular hypofunction: Secondary | ICD-10-CM | POA: Diagnosis not present

## 2018-04-16 DIAGNOSIS — M479 Spondylosis, unspecified: Secondary | ICD-10-CM | POA: Diagnosis not present

## 2018-04-16 DIAGNOSIS — M25549 Pain in joints of unspecified hand: Secondary | ICD-10-CM | POA: Diagnosis not present

## 2018-04-16 DIAGNOSIS — E1165 Type 2 diabetes mellitus with hyperglycemia: Secondary | ICD-10-CM | POA: Diagnosis not present

## 2018-04-16 DIAGNOSIS — I10 Essential (primary) hypertension: Secondary | ICD-10-CM | POA: Diagnosis not present

## 2018-04-16 DIAGNOSIS — E782 Mixed hyperlipidemia: Secondary | ICD-10-CM | POA: Diagnosis not present

## 2018-04-16 DIAGNOSIS — F419 Anxiety disorder, unspecified: Secondary | ICD-10-CM | POA: Diagnosis not present

## 2018-04-16 DIAGNOSIS — Z7984 Long term (current) use of oral hypoglycemic drugs: Secondary | ICD-10-CM | POA: Diagnosis not present

## 2018-04-16 DIAGNOSIS — N400 Enlarged prostate without lower urinary tract symptoms: Secondary | ICD-10-CM | POA: Diagnosis not present

## 2018-04-16 DIAGNOSIS — D751 Secondary polycythemia: Secondary | ICD-10-CM | POA: Diagnosis not present

## 2018-04-16 DIAGNOSIS — R61 Generalized hyperhidrosis: Secondary | ICD-10-CM | POA: Diagnosis not present

## 2018-04-16 DIAGNOSIS — E1142 Type 2 diabetes mellitus with diabetic polyneuropathy: Secondary | ICD-10-CM | POA: Diagnosis not present

## 2018-04-16 DIAGNOSIS — M25552 Pain in left hip: Secondary | ICD-10-CM | POA: Diagnosis not present

## 2018-04-22 ENCOUNTER — Other Ambulatory Visit: Payer: Self-pay

## 2018-04-22 NOTE — Patient Outreach (Signed)
[facility]) Care Management  [place]  04/22/2018   Seth Schwartz Sep 12, 1936 [id number]  Subjective: Successful call placed to the patient for assessment. HIPAA verified.  The patient states that he is doing well today.  He denies any falls.  He does have chronic pain in his back and left hip that he rates at a 5/10.  He is taking medication for the problem.  He checked his blood sugar this morning and it was 126.  Yesterday it was 128.  He states that he is monitoring his food intake, but once monthly he will indulge.   He is adherent with his medications. He is walking 3-4 days a week and plans to go back to the center.  His weight is 197.  The patient does states that he has been having a funny feeling in his feet that he can not describe. He said that it will travel up his legs and then he will break out in a sweat.  He has seen Dr Jacelyn Grip for the problem and had a modification to his medications.  Encounter Medications:  Outpatient Encounter Medications as of 04/22/2018  Medication Sig Note  . Cholecalciferol 2000 units CAPS Take 2,000 Units by mouth daily.    . CHROMIUM-CINNAMON PO Take 2 capsules by mouth daily. 02/06/2018: Cinsulin: Also includes 500 IU Vitamin D3  . Coenzyme Q10 (EQL COQ10) 300 MG CAPS Take 300 mg by mouth daily.  02/12/2018: ON HOLD   . diazepam (VALIUM) 5 MG tablet Take 1 tablet (5 mg total) by mouth every 6 (six) hours as needed for muscle spasms. (Patient taking differently: Take 2.5 mg by mouth at bedtime. )   . fluvoxaMINE (LUVOX) 50 MG tablet Take 50 mg by mouth 2 (two) times daily. 04/22/2018: Patient states he takes 1 tablet daily   . gabapentin (NEURONTIN) 300 MG capsule Take 300 mg by mouth 2 (two) times daily.   Marland Kitchen HYDROcodone-acetaminophen (NORCO) 10-325 MG tablet Take 0.5 tablets by mouth 4 (four) times daily.    . hydroxychloroquine (PLAQUENIL) 200 MG tablet Take 200 mg by mouth 2 (two) times daily.   . metFORMIN (GLUCOPHAGE) 500 MG  tablet Take 500 mg by mouth daily.    . Omega-3 Fatty Acids (FISH OIL EXTRA STRENGTH PO) Take 1,500 mg by mouth daily. 02/12/2018: ON HOLD   . PAPAYA ENZYMES PO Take 2 tablets by mouth as needed (indigestion). 02/12/2018: ON HOLD   . polyethylene glycol (MIRALAX / GLYCOLAX) packet Take 17 g by mouth every evening.    . ranitidine (ZANTAC) 150 MG tablet Take 150 mg by mouth at bedtime. 04/22/2018: Patient states that he takes that as needed   . rosuvastatin (CRESTOR) 20 MG tablet Take 20 mg by mouth daily.   . SUPER B COMPLEX/C PO Take 1 tablet by mouth daily. 02/12/2018: ON HOLD   . timolol (BETIMOL) 0.5 % ophthalmic solution Place 1 drop into both eyes at bedtime.    Marland Kitchen HYDROcodone-acetaminophen (NORCO/VICODIN) 5-325 MG tablet Take 1-2 tablets by mouth every 4 (four) hours as needed for moderate pain. (Patient not taking: Reported on 04/22/2018)    No facility-administered encounter medications on file as of 04/22/2018.     Functional Status:  In your present state of health, do you have any difficulty performing the following activities: 02/15/2018 02/12/2018  Hearing? N N  Vision? Y Y  Difficulty concentrating or making decisions? N N  Walking or climbing stairs? N N  Dressing or bathing? N  N  Doing errands, shopping? N N  Preparing Food and eating ? - -  Using the Toilet? - -  In the past six months, have you accidently leaked urine? - -  Do you have problems with loss of bowel control? - -  Managing your Medications? - -  Managing your Finances? - -  Housekeeping or managing your Housekeeping? - -  Some recent data might be hidden    Fall/Depression Screening: Fall Risk  04/22/2018 03/13/2018 01/10/2018  Falls in the past year? No No No   PHQ 2/9 Scores 11/09/2017 10/30/2017  PHQ - 2 Score 1 1    Assessment: Patient will continue to benefit from health coach outreach for disease management and support.  THN CM Care Plan Problem One     Most Recent Value  THN Long Term Goal   In 90 days the  patient will verbalize that he has lowered his a1c by 1-2 points  [facility] Long Term Goal Start Date  04/22/18  Interventions for Problem One Long Term Goal  Reviewed cbg record,  discussed dietary intake,  reviewed and discussed medication management     Plan: [place] will contact patient in the month of  August and patient agrees to next outreach.  [name] RN, BSN, [place] Direct Dial:  [phone]  Fax: [phone]

## 2018-05-06 DIAGNOSIS — H401131 Primary open-angle glaucoma, bilateral, mild stage: Secondary | ICD-10-CM | POA: Diagnosis not present

## 2018-05-09 IMAGING — NM NM PARATHYROID W/ SPECT
5 series · 20 of 20 positions shown · non-contrast
Comparison: None

CLINICAL DATA: His primary hyperparathyroidism, hypercalcemia

EXAM:
NM PARATHYROID SCINTIGRAPHY AND SPECT IMAGING
TECHNIQUE: Following intravenous administration of radiopharmaceutical, early
and 2-hour delayed planar images were obtained in the anterior
projection. Delayed triplanar SPECT images were also obtained at 2
hours.
RADIOPHARMACEUTICALS:  26.3 mCi 2c-33m Sestamibi IV

[Series 1: spect - (id)_(id) · 4.1mm · 4.14mm/px · 6 of 128 frames shown]
[frame 11/128]
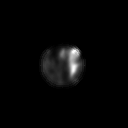
[frame 32/128]
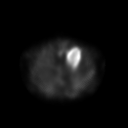
[frame 54/128]
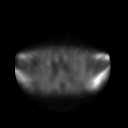
[frame 75/128]
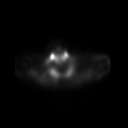
[frame 96/128]
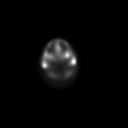
[frame 118/128]
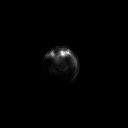

[Series 1: 15 min ant · 2.07mm/px · 1 of 1 slices shown]
[im 1/1]
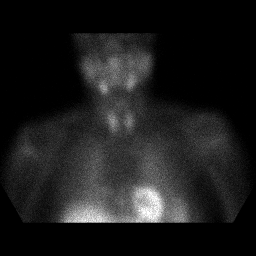

[Series 1: spect - (id)_(id)_cor · 4.1mm · 4.14mm/px · 6 of 128 frames shown]
[frame 11/128]
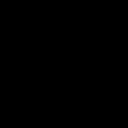
[frame 32/128]
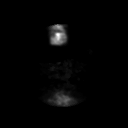
[frame 54/128]
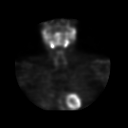
[frame 75/128]
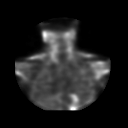
[frame 96/128]
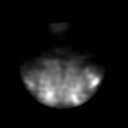
[frame 118/128]
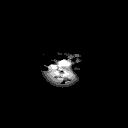

[Series 2: 2 hr ant · 2.07mm/px · 1 of 1 slices shown]
[im 1/1]
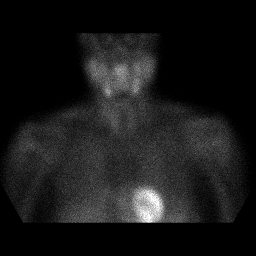

[Series 3: spect parathyroid · 4.14mm/px · 6 of 64 frames shown]
[frame 6/64]
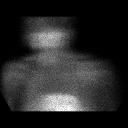
[frame 16/64]
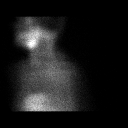
[frame 27/64]
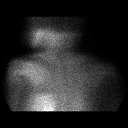
[frame 38/64]
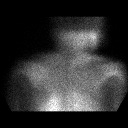
[frame 48/64]
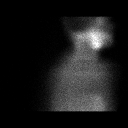
[frame 59/64]
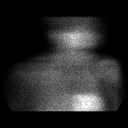

[20 of 20 positions shown; findings below may reference images not displayed]

FINDINGS: Planar imaging: Normal initial distribution of sestamibi within
thyroid lobes. Normal washout of tracer from thyroid tissue
bilaterally. No focal abnormal retention of tracer seen to suggest
parathyroid adenoma. No ectopic localization of tracer within the
mediastinum.

SPECT imaging: No abnormal sestamibi retention identified to suggest
parathyroid adenoma. No ectopic localization of tracer within the
mediastinum.
IMPRESSION: Negative parathyroid scan.

## 2018-05-13 DIAGNOSIS — E291 Testicular hypofunction: Secondary | ICD-10-CM | POA: Diagnosis not present

## 2018-05-13 DIAGNOSIS — Z8639 Personal history of other endocrine, nutritional and metabolic disease: Secondary | ICD-10-CM | POA: Diagnosis not present

## 2018-05-13 DIAGNOSIS — E119 Type 2 diabetes mellitus without complications: Secondary | ICD-10-CM | POA: Diagnosis not present

## 2018-05-13 DIAGNOSIS — E782 Mixed hyperlipidemia: Secondary | ICD-10-CM | POA: Diagnosis not present

## 2018-05-14 DIAGNOSIS — Z8639 Personal history of other endocrine, nutritional and metabolic disease: Secondary | ICD-10-CM | POA: Diagnosis not present

## 2018-05-14 DIAGNOSIS — E119 Type 2 diabetes mellitus without complications: Secondary | ICD-10-CM | POA: Diagnosis not present

## 2018-05-23 ENCOUNTER — Other Ambulatory Visit: Payer: Self-pay

## 2018-05-23 NOTE — Patient Outreach (Signed)
[place] [facility] [facility]) Care Management  05/23/2018  Seth Schwartz [birth year] [id number]    1st outreach attempt to the patient for assessment. Wife answered the phone and stated he was taking a nap.  HIPAA compliant voicemail left with contact information.  Plan: RN Health Coach will send letter. RN Health Coach will make another attempt to the patient within four business days.  [name] RN, BSN, [place] Direct Dial:  [phone]  Fax: [phone]

## 2018-05-24 ENCOUNTER — Other Ambulatory Visit: Payer: Self-pay

## 2018-05-24 NOTE — Patient Outreach (Signed)
[facility]) Care Management  05/24/2018   Seth Schwartz 07-Apr-1936 [id number]  Subjective: Telephone call to the  Patient for assessment.  HIPAA verified.  The patient states that he has no energy since the surgery that he had in may for his parathyroid.  He has been sleeping a lot and has gained a few pounds.  He states that his blood sugar this morning was 140.  They have been ranging from 125-149.  Hi a1c in May was 6.7.  I advised the patient to continue to take his medications as prescribed, get rest, monitoring his food intake and checking his blood sugars.  The patient verbalized understanding.  The patient continues to have pain in his back and left hip that he rates at a 5/10.  He denies any falls.  The patient has an appointment to see Dr. Nevin Bloodgood the 28 th of August.    Current Medications:  Current Outpatient Medications  Medication Sig Dispense Refill  . Cholecalciferol 2000 units CAPS Take 2,000 Units by mouth daily.     . Coenzyme Q10 (EQL COQ10) 300 MG CAPS Take 300 mg by mouth daily.     . diazepam (VALIUM) 5 MG tablet Take 1 tablet (5 mg total) by mouth every 6 (six) hours as needed for muscle spasms. (Patient taking differently: Take 2.5 mg by mouth at bedtime. ) 60 tablet 0  . fluvoxaMINE (LUVOX) 50 MG tablet Take 50 mg by mouth 2 (two) times daily.    Marland Kitchen gabapentin (NEURONTIN) 300 MG capsule Take 300 mg by mouth 2 (two) times daily.    Marland Kitchen HYDROcodone-acetaminophen (NORCO) 10-325 MG tablet Take 0.5 tablets by mouth 4 (four) times daily.     . hydroxychloroquine (PLAQUENIL) 200 MG tablet Take 200 mg by mouth 2 (two) times daily.    . metFORMIN (GLUCOPHAGE) 500 MG tablet Take 500 mg by mouth daily.     . polyethylene glycol (MIRALAX / GLYCOLAX) packet Take 17 g by mouth every evening.     . ranitidine (ZANTAC) 150 MG tablet Take 150 mg by mouth at bedtime.    . rosuvastatin (CRESTOR) 20 MG tablet Take 20 mg by mouth daily.    . timolol (BETIMOL) 0.5 %  ophthalmic solution Place 1 drop into both eyes at bedtime.     . TURMERIC PO Take by mouth.    . CHROMIUM-CINNAMON PO Take 2 capsules by mouth daily.    Marland Kitchen HYDROcodone-acetaminophen (NORCO/VICODIN) 5-325 MG tablet Take 1-2 tablets by mouth every 4 (four) hours as needed for moderate pain. (Patient not taking: Reported on 04/22/2018) 20 tablet 0  . Omega-3 Fatty Acids (FISH OIL EXTRA STRENGTH PO) Take 1,500 mg by mouth daily.    Marland Kitchen PAPAYA ENZYMES PO Take 2 tablets by mouth as needed (indigestion).    . SUPER B COMPLEX/C PO Take 1 tablet by mouth daily.     No current facility-administered medications for this visit.     Functional Status:  In your present state of health, do you have any difficulty performing the following activities: 02/15/2018 02/12/2018  Hearing? N N  Vision? Y Y  Difficulty concentrating or making decisions? N N  Walking or climbing stairs? N N  Dressing or bathing? N N  Doing errands, shopping? N N  Preparing Food and eating ? - -  Using the Toilet? - -  In the past six months, have you accidently leaked urine? - -  Do you have problems with loss of bowel control? - -  Managing your Medications? - -  Managing your Finances? - -  Housekeeping or managing your Housekeeping? - -  Some recent data might be hidden    Fall/Depression Screening: Fall Risk  05/24/2018 04/22/2018 03/13/2018  Falls in the past year? No No No   PHQ 2/9 Scores 11/09/2017 10/30/2017  PHQ - 2 Score 1 1    Assessment: Patient will continue to benefit from health coach outreach for disease management and support. THN CM Care Plan Problem One     Most Recent Value  Care Plan Problem One  knowledge deficiet related to self care management of diabetes  THN Long Term Goal   In 90 days the patient will verbalize that he has maintained his a1c of 6.7  Interventions for Problem One Long Term Goal  discussed cbg record,  discussed dietary intake,  reviewed and discussed medication management     Plan:  Seth Schwartz will contact patient in the month of November. The patient requests and agrees to next outreach.  [name] RN, BSN, [place] Direct Dial:  [phone]  Fax: [phone]

## 2018-06-03 DIAGNOSIS — E291 Testicular hypofunction: Secondary | ICD-10-CM | POA: Diagnosis not present

## 2018-06-03 DIAGNOSIS — E782 Mixed hyperlipidemia: Secondary | ICD-10-CM | POA: Diagnosis not present

## 2018-06-03 DIAGNOSIS — Z8639 Personal history of other endocrine, nutritional and metabolic disease: Secondary | ICD-10-CM | POA: Diagnosis not present

## 2018-06-03 DIAGNOSIS — E119 Type 2 diabetes mellitus without complications: Secondary | ICD-10-CM | POA: Diagnosis not present

## 2018-06-04 DIAGNOSIS — R948 Abnormal results of function studies of other organs and systems: Secondary | ICD-10-CM | POA: Diagnosis not present

## 2018-06-04 DIAGNOSIS — E291 Testicular hypofunction: Secondary | ICD-10-CM | POA: Diagnosis not present

## 2018-06-06 DIAGNOSIS — R350 Frequency of micturition: Secondary | ICD-10-CM | POA: Diagnosis not present

## 2018-06-28 DIAGNOSIS — M064 Inflammatory polyarthropathy: Secondary | ICD-10-CM | POA: Diagnosis not present

## 2018-06-28 DIAGNOSIS — Z23 Encounter for immunization: Secondary | ICD-10-CM | POA: Diagnosis not present

## 2018-06-28 DIAGNOSIS — M1990 Unspecified osteoarthritis, unspecified site: Secondary | ICD-10-CM | POA: Diagnosis not present

## 2018-06-28 DIAGNOSIS — Z79899 Other long term (current) drug therapy: Secondary | ICD-10-CM | POA: Diagnosis not present

## 2018-07-17 DIAGNOSIS — E291 Testicular hypofunction: Secondary | ICD-10-CM | POA: Diagnosis not present

## 2018-08-02 DIAGNOSIS — M1990 Unspecified osteoarthritis, unspecified site: Secondary | ICD-10-CM | POA: Diagnosis not present

## 2018-08-02 DIAGNOSIS — M064 Inflammatory polyarthropathy: Secondary | ICD-10-CM | POA: Diagnosis not present

## 2018-08-02 DIAGNOSIS — Z79899 Other long term (current) drug therapy: Secondary | ICD-10-CM | POA: Diagnosis not present

## 2018-08-05 DIAGNOSIS — H401131 Primary open-angle glaucoma, bilateral, mild stage: Secondary | ICD-10-CM | POA: Diagnosis not present

## 2018-08-09 DIAGNOSIS — M25552 Pain in left hip: Secondary | ICD-10-CM | POA: Diagnosis not present

## 2018-08-09 DIAGNOSIS — K219 Gastro-esophageal reflux disease without esophagitis: Secondary | ICD-10-CM | POA: Diagnosis not present

## 2018-08-09 DIAGNOSIS — M479 Spondylosis, unspecified: Secondary | ICD-10-CM | POA: Diagnosis not present

## 2018-08-09 DIAGNOSIS — I10 Essential (primary) hypertension: Secondary | ICD-10-CM | POA: Diagnosis not present

## 2018-08-09 DIAGNOSIS — R61 Generalized hyperhidrosis: Secondary | ICD-10-CM | POA: Diagnosis not present

## 2018-08-09 DIAGNOSIS — M130 Polyarthritis, unspecified: Secondary | ICD-10-CM | POA: Diagnosis not present

## 2018-08-09 DIAGNOSIS — M25549 Pain in joints of unspecified hand: Secondary | ICD-10-CM | POA: Diagnosis not present

## 2018-08-09 DIAGNOSIS — E1142 Type 2 diabetes mellitus with diabetic polyneuropathy: Secondary | ICD-10-CM | POA: Diagnosis not present

## 2018-08-09 DIAGNOSIS — D751 Secondary polycythemia: Secondary | ICD-10-CM | POA: Diagnosis not present

## 2018-08-09 DIAGNOSIS — E782 Mixed hyperlipidemia: Secondary | ICD-10-CM | POA: Diagnosis not present

## 2018-08-09 DIAGNOSIS — F419 Anxiety disorder, unspecified: Secondary | ICD-10-CM | POA: Diagnosis not present

## 2018-08-15 DIAGNOSIS — R5383 Other fatigue: Secondary | ICD-10-CM | POA: Diagnosis not present

## 2018-08-26 ENCOUNTER — Other Ambulatory Visit: Payer: Self-pay

## 2018-08-26 NOTE — Patient Outreach (Signed)
[facility]) Care Management  08/26/2018   Seth Schwartz Jun 16, 1936 [id number]  Subjective: Successful telephone call to the patient for assessment. HIPAA verified. The patient states that he has been doing ok.  He states that he has chronic pain daily in his back that he rates at a 5/10.  He denies any falls.  He states that he had his a1c was checked in November and it was 6.4 it is down from his 6.7 in May.  He is cognizant of his food intake.  He takes his medications as prescribed.  He reports that his rheumatologist has started him on a medication called Leflunomide 20 mg daily.  He had his flu shot in October of this year.  He states that he has been having trouble getting back his energy since he had his surgery.  He is taking his meds to help and does some home exercises. He states that his physician feels that he needs to give his body time to heal.  He saw his primary on November 1st of this year.  Current Medications:  Current Outpatient Medications  Medication Sig Dispense Refill  . Cholecalciferol 2000 units CAPS Take 2,000 Units by mouth daily.     . Coenzyme Q10 (EQL COQ10) 300 MG CAPS Take 300 mg by mouth daily.     . diazepam (VALIUM) 5 MG tablet Take 1 tablet (5 mg total) by mouth every 6 (six) hours as needed for muscle spasms. (Patient taking differently: Take 2.5 mg by mouth at bedtime. ) 60 tablet 0  . fluvoxaMINE (LUVOX) 50 MG tablet Take 50 mg by mouth 2 (two) times daily.    Marland Kitchen gabapentin (NEURONTIN) 300 MG capsule Take 300 mg by mouth 2 (two) times daily.    Marland Kitchen HYDROcodone-acetaminophen (NORCO) 10-325 MG tablet Take 0.5 tablets by mouth 4 (four) times daily.     Marland Kitchen leflunomide (ARAVA) 20 MG tablet Take 20 mg by mouth daily.    . metFORMIN (GLUCOPHAGE) 500 MG tablet Take 500 mg by mouth daily.     . Omega-3 Fatty Acids (FISH OIL EXTRA STRENGTH PO) Take 1,500 mg by mouth daily.    Marland Kitchen PAPAYA ENZYMES PO Take 2 tablets by mouth as needed (indigestion).     . ranitidine (ZANTAC) 150 MG tablet Take 150 mg by mouth at bedtime.    . rosuvastatin (CRESTOR) 20 MG tablet Take 20 mg by mouth daily.    . SUPER B COMPLEX/C PO Take 1 tablet by mouth daily.    . timolol (BETIMOL) 0.5 % ophthalmic solution Place 1 drop into both eyes at bedtime.     . CHROMIUM-CINNAMON PO Take 2 capsules by mouth daily.    Marland Kitchen HYDROcodone-acetaminophen (NORCO/VICODIN) 5-325 MG tablet Take 1-2 tablets by mouth every 4 (four) hours as needed for moderate pain. (Patient not taking: Reported on 04/22/2018) 20 tablet 0  . hydroxychloroquine (PLAQUENIL) 200 MG tablet Take 200 mg by mouth 2 (two) times daily.    . polyethylene glycol (MIRALAX / GLYCOLAX) packet Take 17 g by mouth every evening.     . TURMERIC PO Take by mouth.     No current facility-administered medications for this visit.     Functional Status:  In your present state of health, do you have any difficulty performing the following activities: 02/15/2018 02/12/2018  Hearing? N N  Vision? Y Y  Difficulty concentrating or making decisions? N N  Walking or climbing stairs? N N  Dressing or bathing? N N  Doing errands, shopping? N N  Preparing Food and eating ? - -  Using the Toilet? - -  In the past six months, have you accidently leaked urine? - -  Do you have problems with loss of bowel control? - -  Managing your Medications? - -  Managing your Finances? - -  Housekeeping or managing your Housekeeping? - -  Some recent data might be hidden    Fall/Depression Screening: Fall Risk  08/26/2018 05/24/2018 04/22/2018  Falls in the past year? 0 No No   PHQ 2/9 Scores 11/09/2017 10/30/2017  PHQ - 2 Score 1 1    Assessment: Patient will continue to benefit from health coach outreach for disease management and support.  THN CM Care Plan Problem One     Most Recent Value  THN Long Term Goal   In 90 days the patient will verbalize that he has maintained his a1c of 6.4  THN Long Term Goal Start Date  08/26/18   Interventions for Problem One Long Term Goal  Talked with the patient about his exercise , diet and medication management     Plan: [place] will contact patient in the month of February and patient agrees to next outreach.  [name] RN, BSN, [place] Direct Dial:  [phone]  Fax: [phone]

## 2018-09-02 DIAGNOSIS — M064 Inflammatory polyarthropathy: Secondary | ICD-10-CM | POA: Diagnosis not present

## 2018-09-02 DIAGNOSIS — E1169 Type 2 diabetes mellitus with other specified complication: Secondary | ICD-10-CM | POA: Diagnosis not present

## 2018-09-02 DIAGNOSIS — Z79899 Other long term (current) drug therapy: Secondary | ICD-10-CM | POA: Diagnosis not present

## 2018-09-02 DIAGNOSIS — M1990 Unspecified osteoarthritis, unspecified site: Secondary | ICD-10-CM | POA: Diagnosis not present

## 2018-09-04 DIAGNOSIS — E291 Testicular hypofunction: Secondary | ICD-10-CM | POA: Diagnosis not present

## 2018-09-04 DIAGNOSIS — R948 Abnormal results of function studies of other organs and systems: Secondary | ICD-10-CM | POA: Diagnosis not present

## 2018-09-10 DIAGNOSIS — E291 Testicular hypofunction: Secondary | ICD-10-CM | POA: Diagnosis not present

## 2018-10-01 DIAGNOSIS — E782 Mixed hyperlipidemia: Secondary | ICD-10-CM | POA: Diagnosis not present

## 2018-10-01 DIAGNOSIS — E1142 Type 2 diabetes mellitus with diabetic polyneuropathy: Secondary | ICD-10-CM | POA: Diagnosis not present

## 2018-10-01 DIAGNOSIS — R61 Generalized hyperhidrosis: Secondary | ICD-10-CM | POA: Diagnosis not present

## 2018-10-01 DIAGNOSIS — M479 Spondylosis, unspecified: Secondary | ICD-10-CM | POA: Diagnosis not present

## 2018-10-01 DIAGNOSIS — D751 Secondary polycythemia: Secondary | ICD-10-CM | POA: Diagnosis not present

## 2018-10-01 DIAGNOSIS — R040 Epistaxis: Secondary | ICD-10-CM | POA: Diagnosis not present

## 2018-10-01 DIAGNOSIS — W19XXXA Unspecified fall, initial encounter: Secondary | ICD-10-CM | POA: Diagnosis not present

## 2018-10-01 DIAGNOSIS — K219 Gastro-esophageal reflux disease without esophagitis: Secondary | ICD-10-CM | POA: Diagnosis not present

## 2018-10-01 DIAGNOSIS — J4521 Mild intermittent asthma with (acute) exacerbation: Secondary | ICD-10-CM | POA: Diagnosis not present

## 2018-10-01 DIAGNOSIS — F419 Anxiety disorder, unspecified: Secondary | ICD-10-CM | POA: Diagnosis not present

## 2018-10-01 DIAGNOSIS — I10 Essential (primary) hypertension: Secondary | ICD-10-CM | POA: Diagnosis not present

## 2018-11-05 DIAGNOSIS — H401111 Primary open-angle glaucoma, right eye, mild stage: Secondary | ICD-10-CM | POA: Diagnosis not present

## 2018-11-05 DIAGNOSIS — H401122 Primary open-angle glaucoma, left eye, moderate stage: Secondary | ICD-10-CM | POA: Diagnosis not present

## 2018-11-26 ENCOUNTER — Other Ambulatory Visit: Payer: Self-pay

## 2018-11-26 NOTE — Patient Outreach (Signed)
[facility]) Care Management  11/26/2018   Seth Schwartz 12/21/1935 [id number]   Subjective: Successful outreach to the patient.  HIPAA verified.  The patient states that he has been doing well.  He has started the silver sneaker program and he and his wife goes three days a week. The patient states that he has pain daily from his lower back and he rates the pain at 4/10. He has medication he takes daily for the pain.  The patient states that he had one fall in late December early January.  He was squatting in the bathroom and looking in the cabinet under the sink.  He lost his balance and fell back hit his head on the bath tub.  He did see his primary care and he stated it was fine.  The patient states that he has not been eating correctly during the holidays but has since changed his eating habits.  He does not check his blood sugar daily.  He states that the doctor told him with an a1c of 6.4 checking a couple days a week would be fine.  He last checked his blood sugar on Sunday and it was 142.  He states that he is taking his medications as prescribed and had a Symbicort 160-4.5 inhaler  added for seasonal allergies.  The patient states that he has an appointment with Dr Jacelyn Grip in March.    Current Medications:  Current Outpatient Medications  Medication Sig Dispense Refill  . budesonide-formoterol (SYMBICORT) 160-4.5 MCG/ACT inhaler Inhale 2 puffs into the lungs daily.    . Cholecalciferol 2000 units CAPS Take 2,000 Units by mouth daily.     . Coenzyme Q10 (EQL COQ10) 300 MG CAPS Take 300 mg by mouth daily.     . diazepam (VALIUM) 5 MG tablet Take 1 tablet (5 mg total) by mouth every 6 (six) hours as needed for muscle spasms. (Patient taking differently: Take 2.5 mg by mouth at bedtime. ) 60 tablet 0  . fluvoxaMINE (LUVOX) 50 MG tablet Take 50 mg by mouth 2 (two) times daily.    Marland Kitchen gabapentin (NEURONTIN) 300 MG capsule Take 300 mg by mouth 2 (two) times daily.    Marland Kitchen  HYDROcodone-acetaminophen (NORCO) 10-325 MG tablet Take 0.5 tablets by mouth 4 (four) times daily.     Marland Kitchen leflunomide (ARAVA) 20 MG tablet Take 20 mg by mouth daily.    . metFORMIN (GLUCOPHAGE) 500 MG tablet Take 500 mg by mouth daily.     . Omega-3 Fatty Acids (FISH OIL EXTRA STRENGTH PO) Take 1,500 mg by mouth daily.    . rosuvastatin (CRESTOR) 20 MG tablet Take 20 mg by mouth daily.    . SUPER B COMPLEX/C PO Take 1 tablet by mouth daily.    . timolol (BETIMOL) 0.5 % ophthalmic solution Place 1 drop into both eyes at bedtime.     . CHROMIUM-CINNAMON PO Take 2 capsules by mouth daily.    Marland Kitchen HYDROcodone-acetaminophen (NORCO/VICODIN) 5-325 MG tablet Take 1-2 tablets by mouth every 4 (four) hours as needed for moderate pain. (Patient not taking: Reported on 04/22/2018) 20 tablet 0  . hydroxychloroquine (PLAQUENIL) 200 MG tablet Take 200 mg by mouth 2 (two) times daily.    Marland Kitchen PAPAYA ENZYMES PO Take 2 tablets by mouth as needed (indigestion).    . polyethylene glycol (MIRALAX / GLYCOLAX) packet Take 17 g by mouth every evening.     . ranitidine (ZANTAC) 150 MG tablet Take 150 mg by mouth at  bedtime.    . TURMERIC PO Take by mouth.     No current facility-administered medications for this visit.     Functional Status:  In your present state of health, do you have any difficulty performing the following activities: 02/15/2018 02/12/2018  Hearing? N N  Vision? Y Y  Difficulty concentrating or making decisions? N N  Walking or climbing stairs? N N  Dressing or bathing? N N  Doing errands, shopping? N N  Some recent data might be hidden    Fall/Depression Screening: Fall Risk  11/26/2018 08/26/2018 05/24/2018  Falls in the past year? 1 0 No  Number falls in past yr: 0 - -  Injury with Fall? 1 - -  Risk for fall due to : Impaired balance/gait - -  Follow up Education provided - -   PHQ 2/9 Scores 11/09/2017 10/30/2017  PHQ - 2 Score 1 1    Assessment: Patient will continue to benefit from health  coach outreach for disease management and support. THN CM Care Plan Problem One     Most Recent Value  THN Long Term Goal   In 90 days the patient will verbalize that he has maintained his a1c of 6.4  THN Long Term Goal Start Date  11/26/18  Interventions for Problem One Long Term Goal  Discussed with the patient about diet and the patient states that he has eaten some things that he should not but is getting back  in order.  He has started the silver sneakers program  and goes three times a week .  He takes his mediication ans prescribed.       Plan: RN Health Coach will contact patient in the month of May and patient agrees to next outreach.   [name] RN, BSN, [place] Direct Dial:  [phone]  Fax: [phone]

## 2018-11-27 DIAGNOSIS — M064 Inflammatory polyarthropathy: Secondary | ICD-10-CM | POA: Diagnosis not present

## 2018-12-03 DIAGNOSIS — M064 Inflammatory polyarthropathy: Secondary | ICD-10-CM | POA: Diagnosis not present

## 2018-12-03 DIAGNOSIS — Z8639 Personal history of other endocrine, nutritional and metabolic disease: Secondary | ICD-10-CM | POA: Diagnosis not present

## 2018-12-03 DIAGNOSIS — E1169 Type 2 diabetes mellitus with other specified complication: Secondary | ICD-10-CM | POA: Diagnosis not present

## 2018-12-03 DIAGNOSIS — M1990 Unspecified osteoarthritis, unspecified site: Secondary | ICD-10-CM | POA: Diagnosis not present

## 2018-12-03 DIAGNOSIS — E119 Type 2 diabetes mellitus without complications: Secondary | ICD-10-CM | POA: Diagnosis not present

## 2018-12-03 DIAGNOSIS — Z79899 Other long term (current) drug therapy: Secondary | ICD-10-CM | POA: Diagnosis not present

## 2018-12-04 DIAGNOSIS — E1142 Type 2 diabetes mellitus with diabetic polyneuropathy: Secondary | ICD-10-CM | POA: Diagnosis not present

## 2018-12-04 DIAGNOSIS — M479 Spondylosis, unspecified: Secondary | ICD-10-CM | POA: Diagnosis not present

## 2018-12-04 DIAGNOSIS — E782 Mixed hyperlipidemia: Secondary | ICD-10-CM | POA: Diagnosis not present

## 2018-12-04 DIAGNOSIS — F339 Major depressive disorder, recurrent, unspecified: Secondary | ICD-10-CM | POA: Diagnosis not present

## 2018-12-04 DIAGNOSIS — M069 Rheumatoid arthritis, unspecified: Secondary | ICD-10-CM | POA: Diagnosis not present

## 2018-12-04 DIAGNOSIS — I10 Essential (primary) hypertension: Secondary | ICD-10-CM | POA: Diagnosis not present

## 2018-12-04 DIAGNOSIS — J4521 Mild intermittent asthma with (acute) exacerbation: Secondary | ICD-10-CM | POA: Diagnosis not present

## 2018-12-04 DIAGNOSIS — M1990 Unspecified osteoarthritis, unspecified site: Secondary | ICD-10-CM | POA: Diagnosis not present

## 2018-12-04 DIAGNOSIS — N400 Enlarged prostate without lower urinary tract symptoms: Secondary | ICD-10-CM | POA: Diagnosis not present

## 2018-12-09 ENCOUNTER — Other Ambulatory Visit: Payer: Self-pay | Admitting: Internal Medicine

## 2018-12-09 DIAGNOSIS — E229 Hyperfunction of pituitary gland, unspecified: Principal | ICD-10-CM

## 2018-12-09 DIAGNOSIS — R7989 Other specified abnormal findings of blood chemistry: Secondary | ICD-10-CM

## 2018-12-17 DIAGNOSIS — E1142 Type 2 diabetes mellitus with diabetic polyneuropathy: Secondary | ICD-10-CM | POA: Diagnosis not present

## 2018-12-17 DIAGNOSIS — G629 Polyneuropathy, unspecified: Secondary | ICD-10-CM | POA: Diagnosis not present

## 2018-12-17 DIAGNOSIS — M069 Rheumatoid arthritis, unspecified: Secondary | ICD-10-CM | POA: Diagnosis not present

## 2018-12-17 DIAGNOSIS — Z0000 Encounter for general adult medical examination without abnormal findings: Secondary | ICD-10-CM | POA: Diagnosis not present

## 2018-12-17 DIAGNOSIS — F339 Major depressive disorder, recurrent, unspecified: Secondary | ICD-10-CM | POA: Diagnosis not present

## 2018-12-17 DIAGNOSIS — I10 Essential (primary) hypertension: Secondary | ICD-10-CM | POA: Diagnosis not present

## 2018-12-17 DIAGNOSIS — J4521 Mild intermittent asthma with (acute) exacerbation: Secondary | ICD-10-CM | POA: Diagnosis not present

## 2018-12-17 DIAGNOSIS — M479 Spondylosis, unspecified: Secondary | ICD-10-CM | POA: Diagnosis not present

## 2018-12-17 DIAGNOSIS — E782 Mixed hyperlipidemia: Secondary | ICD-10-CM | POA: Diagnosis not present

## 2018-12-17 DIAGNOSIS — N400 Enlarged prostate without lower urinary tract symptoms: Secondary | ICD-10-CM | POA: Diagnosis not present

## 2018-12-23 ENCOUNTER — Ambulatory Visit
Admission: RE | Admit: 2018-12-23 | Discharge: 2018-12-23 | Disposition: A | Discharge status: Home / Self Care | Payer: PPO | Source: Ambulatory Visit | Attending: Internal Medicine | Admitting: Internal Medicine

## 2018-12-23 ENCOUNTER — Inpatient Hospital Stay: Admission: RE | Admit: 2018-12-23 | Payer: PPO | Source: Ambulatory Visit

## 2018-12-23 ENCOUNTER — Other Ambulatory Visit: Payer: Self-pay

## 2018-12-23 DIAGNOSIS — H539 Unspecified visual disturbance: Secondary | ICD-10-CM | POA: Diagnosis not present

## 2018-12-23 DIAGNOSIS — E229 Hyperfunction of pituitary gland, unspecified: Principal | ICD-10-CM

## 2018-12-23 DIAGNOSIS — R7989 Other specified abnormal findings of blood chemistry: Secondary | ICD-10-CM

## 2018-12-23 MED ORDER — GADOBENATE DIMEGLUMINE 529 MG/ML IV SOLN
10.0000 mL | Freq: Once | INTRAVENOUS | Status: AC | PRN
  Administered 2018-12-23: 10 mL via INTRAVENOUS

## 2018-12-26 DIAGNOSIS — R5383 Other fatigue: Secondary | ICD-10-CM | POA: Diagnosis not present

## 2018-12-26 DIAGNOSIS — E229 Hyperfunction of pituitary gland, unspecified: Secondary | ICD-10-CM | POA: Diagnosis not present

## 2018-12-26 DIAGNOSIS — E291 Testicular hypofunction: Secondary | ICD-10-CM | POA: Diagnosis not present

## 2018-12-26 DIAGNOSIS — E782 Mixed hyperlipidemia: Secondary | ICD-10-CM | POA: Diagnosis not present

## 2018-12-26 DIAGNOSIS — Z8639 Personal history of other endocrine, nutritional and metabolic disease: Secondary | ICD-10-CM | POA: Diagnosis not present

## 2018-12-26 DIAGNOSIS — E119 Type 2 diabetes mellitus without complications: Secondary | ICD-10-CM | POA: Diagnosis not present

## 2019-02-19 ENCOUNTER — Other Ambulatory Visit: Payer: Self-pay

## 2019-02-19 NOTE — Patient Outreach (Signed)
[facility]) Care Management  02/19/2019  GEHRIG PATRAS 1936/09/15 [id number]    1st attempt to outreach the patient.  Wife answered the phone and stated that the patient was laying down and unable to come to the phone.  HIPAA compliant message left with contact information.  Plan: RN Health Coach will make outreach attempt to the patient within thirty business days.  [name] RN, BSN, [place] Direct Dial:  [phone]  Fax: [phone]

## 2019-02-21 ENCOUNTER — Other Ambulatory Visit: Payer: Self-pay

## 2019-02-21 NOTE — Patient Outreach (Signed)
[facility]) Care Management  02/21/2019   Seth Schwartz 02-Jul-1936 [id number]  Subjective: Successful outreach to the patient.  Two patient identifiers obtained.  The patient states that he has been doing well.  He denies any falls.  He states that he has chronic aches with the neuropathy in his feet and arthritis in his hands.  He states that his blood sugar yesterday was 130.  He states that he only checks his blood sugar once or twice a week.  He states that he is eating well and taking his medications as prescribed. Discussed wearing a mask when he leaves the home, washing his hands and social distancing.  He verbalized understanding.  Current Medications:  Current Outpatient Medications  Medication Sig Dispense Refill  . budesonide-formoterol (SYMBICORT) 160-4.5 MCG/ACT inhaler Inhale 2 puffs into the lungs daily.    . Cholecalciferol 2000 units CAPS Take 2,000 Units by mouth daily.     . CHROMIUM-CINNAMON PO Take 2 capsules by mouth daily.    . Coenzyme Q10 (EQL COQ10) 300 MG CAPS Take 300 mg by mouth daily.     . diazepam (VALIUM) 5 MG tablet Take 1 tablet (5 mg total) by mouth every 6 (six) hours as needed for muscle spasms. (Patient taking differently: Take 2.5 mg by mouth at bedtime. ) 60 tablet 0  . fluvoxaMINE (LUVOX) 50 MG tablet Take 50 mg by mouth 2 (two) times daily.    Marland Kitchen gabapentin (NEURONTIN) 300 MG capsule Take 300 mg by mouth 2 (two) times daily.    Marland Kitchen HYDROcodone-acetaminophen (NORCO) 10-325 MG tablet Take 0.5 tablets by mouth 4 (four) times daily.     Marland Kitchen HYDROcodone-acetaminophen (NORCO/VICODIN) 5-325 MG tablet Take 1-2 tablets by mouth every 4 (four) hours as needed for moderate pain. 20 tablet 0  . hydroxychloroquine (PLAQUENIL) 200 MG tablet Take 200 mg by mouth 2 (two) times daily.    Marland Kitchen leflunomide (ARAVA) 20 MG tablet Take 20 mg by mouth daily.    . metFORMIN (GLUCOPHAGE) 500 MG tablet Take 500 mg by mouth daily.     . Omega-3 Fatty Acids (FISH  OIL EXTRA STRENGTH PO) Take 1,500 mg by mouth daily.    Marland Kitchen PAPAYA ENZYMES PO Take 2 tablets by mouth as needed (indigestion).    . polyethylene glycol (MIRALAX / GLYCOLAX) packet Take 17 g by mouth every evening.     . ranitidine (ZANTAC) 150 MG tablet Take 150 mg by mouth at bedtime.    . rosuvastatin (CRESTOR) 20 MG tablet Take 20 mg by mouth daily.    . SUPER B COMPLEX/C PO Take 1 tablet by mouth daily.    . timolol (BETIMOL) 0.5 % ophthalmic solution Place 1 drop into both eyes at bedtime.     . TURMERIC PO Take by mouth.     No current facility-administered medications for this visit.     Functional Status:  No flowsheet data found.  Fall/Depression Screening: Fall Risk  02/21/2019 11/26/2018 08/26/2018  Falls in the past year? 0 1 0  Number falls in past yr: - 0 -  Injury with Fall? - 1 -  Risk for fall due to : - Impaired balance/gait -  Follow up - Education provided -   [facility] 2/9 Scores 11/09/2017 10/30/2017  PHQ - 2 Score 1 1    Assessment: Patient will continue to benefit from health coach outreach for disease management and support. [facility] CM Care Plan Problem One     Most Recent Value  THN Long Term Goal   In 90 days the patient will verbalize that he has maintained his a1c of 6.4  THN Long Term Goal Start Date  02/21/19  Interventions for Problem One Long Term Goal  Discussed FBS, encouraged checking blood sugars, diet, encouraged medication adherenc, discussed precautionary measures for covid-19     Plan: [place] will contact patient in the month of August and patient agrees to next outreach.    [name] RN, BSN, [place] Direct Dial:  [phone]  Fax: [phone]

## 2019-03-18 DIAGNOSIS — R948 Abnormal results of function studies of other organs and systems: Secondary | ICD-10-CM | POA: Diagnosis not present

## 2019-03-18 DIAGNOSIS — E291 Testicular hypofunction: Secondary | ICD-10-CM | POA: Diagnosis not present

## 2019-03-20 DIAGNOSIS — H52223 Regular astigmatism, bilateral: Secondary | ICD-10-CM | POA: Diagnosis not present

## 2019-03-20 DIAGNOSIS — H5203 Hypermetropia, bilateral: Secondary | ICD-10-CM | POA: Diagnosis not present

## 2019-03-20 DIAGNOSIS — D3130 Benign neoplasm of unspecified choroid: Secondary | ICD-10-CM | POA: Diagnosis not present

## 2019-03-20 DIAGNOSIS — H524 Presbyopia: Secondary | ICD-10-CM | POA: Diagnosis not present

## 2019-03-20 DIAGNOSIS — Z961 Presence of intraocular lens: Secondary | ICD-10-CM | POA: Diagnosis not present

## 2019-03-20 DIAGNOSIS — H401131 Primary open-angle glaucoma, bilateral, mild stage: Secondary | ICD-10-CM | POA: Diagnosis not present

## 2019-03-20 DIAGNOSIS — H43811 Vitreous degeneration, right eye: Secondary | ICD-10-CM | POA: Diagnosis not present

## 2019-03-20 DIAGNOSIS — E119 Type 2 diabetes mellitus without complications: Secondary | ICD-10-CM | POA: Diagnosis not present

## 2019-03-25 DIAGNOSIS — E291 Testicular hypofunction: Secondary | ICD-10-CM | POA: Diagnosis not present

## 2019-03-31 ENCOUNTER — Other Ambulatory Visit: Payer: Self-pay

## 2019-03-31 NOTE — Patient Outreach (Signed)
[facility]) Care Management  03/31/2019  Seth Schwartz 1935-12-04 [id number]    RN Health Coach closing the program.  Patient is transitioning to external program [facility] for continued case management.  [name] RN, BSN, [place] Direct Dial:  [phone]  Fax: [phone]

## 2019-04-03 DIAGNOSIS — M064 Inflammatory polyarthropathy: Secondary | ICD-10-CM | POA: Diagnosis not present

## 2019-04-03 DIAGNOSIS — E1169 Type 2 diabetes mellitus with other specified complication: Secondary | ICD-10-CM | POA: Diagnosis not present

## 2019-04-03 DIAGNOSIS — M1990 Unspecified osteoarthritis, unspecified site: Secondary | ICD-10-CM | POA: Diagnosis not present

## 2019-04-03 DIAGNOSIS — Z79899 Other long term (current) drug therapy: Secondary | ICD-10-CM | POA: Diagnosis not present

## 2019-04-03 DIAGNOSIS — M0609 Rheumatoid arthritis without rheumatoid factor, multiple sites: Secondary | ICD-10-CM | POA: Diagnosis not present

## 2019-04-07 DIAGNOSIS — M479 Spondylosis, unspecified: Secondary | ICD-10-CM | POA: Diagnosis not present

## 2019-04-07 DIAGNOSIS — I10 Essential (primary) hypertension: Secondary | ICD-10-CM | POA: Diagnosis not present

## 2019-04-07 DIAGNOSIS — M069 Rheumatoid arthritis, unspecified: Secondary | ICD-10-CM | POA: Diagnosis not present

## 2019-04-07 DIAGNOSIS — F339 Major depressive disorder, recurrent, unspecified: Secondary | ICD-10-CM | POA: Diagnosis not present

## 2019-04-07 DIAGNOSIS — E1142 Type 2 diabetes mellitus with diabetic polyneuropathy: Secondary | ICD-10-CM | POA: Diagnosis not present

## 2019-04-07 DIAGNOSIS — M1990 Unspecified osteoarthritis, unspecified site: Secondary | ICD-10-CM | POA: Diagnosis not present

## 2019-04-07 DIAGNOSIS — N400 Enlarged prostate without lower urinary tract symptoms: Secondary | ICD-10-CM | POA: Diagnosis not present

## 2019-04-07 DIAGNOSIS — E782 Mixed hyperlipidemia: Secondary | ICD-10-CM | POA: Diagnosis not present

## 2019-04-07 DIAGNOSIS — J4521 Mild intermittent asthma with (acute) exacerbation: Secondary | ICD-10-CM | POA: Diagnosis not present

## 2019-05-04 DIAGNOSIS — F339 Major depressive disorder, recurrent, unspecified: Secondary | ICD-10-CM | POA: Diagnosis not present

## 2019-05-04 DIAGNOSIS — E1142 Type 2 diabetes mellitus with diabetic polyneuropathy: Secondary | ICD-10-CM | POA: Diagnosis not present

## 2019-05-04 DIAGNOSIS — N400 Enlarged prostate without lower urinary tract symptoms: Secondary | ICD-10-CM | POA: Diagnosis not present

## 2019-05-04 DIAGNOSIS — M479 Spondylosis, unspecified: Secondary | ICD-10-CM | POA: Diagnosis not present

## 2019-05-04 DIAGNOSIS — I10 Essential (primary) hypertension: Secondary | ICD-10-CM | POA: Diagnosis not present

## 2019-05-04 DIAGNOSIS — M1990 Unspecified osteoarthritis, unspecified site: Secondary | ICD-10-CM | POA: Diagnosis not present

## 2019-05-04 DIAGNOSIS — J4521 Mild intermittent asthma with (acute) exacerbation: Secondary | ICD-10-CM | POA: Diagnosis not present

## 2019-05-04 DIAGNOSIS — E782 Mixed hyperlipidemia: Secondary | ICD-10-CM | POA: Diagnosis not present

## 2019-05-04 DIAGNOSIS — M069 Rheumatoid arthritis, unspecified: Secondary | ICD-10-CM | POA: Diagnosis not present

## 2019-05-26 ENCOUNTER — Ambulatory Visit: Payer: PPO

## 2019-06-17 DIAGNOSIS — H401131 Primary open-angle glaucoma, bilateral, mild stage: Secondary | ICD-10-CM | POA: Diagnosis not present

## 2019-06-17 DIAGNOSIS — Z961 Presence of intraocular lens: Secondary | ICD-10-CM | POA: Diagnosis not present

## 2019-06-24 DIAGNOSIS — J4521 Mild intermittent asthma with (acute) exacerbation: Secondary | ICD-10-CM | POA: Diagnosis not present

## 2019-06-24 DIAGNOSIS — M069 Rheumatoid arthritis, unspecified: Secondary | ICD-10-CM | POA: Diagnosis not present

## 2019-06-24 DIAGNOSIS — M479 Spondylosis, unspecified: Secondary | ICD-10-CM | POA: Diagnosis not present

## 2019-06-24 DIAGNOSIS — N400 Enlarged prostate without lower urinary tract symptoms: Secondary | ICD-10-CM | POA: Diagnosis not present

## 2019-06-24 DIAGNOSIS — E782 Mixed hyperlipidemia: Secondary | ICD-10-CM | POA: Diagnosis not present

## 2019-06-24 DIAGNOSIS — E1142 Type 2 diabetes mellitus with diabetic polyneuropathy: Secondary | ICD-10-CM | POA: Diagnosis not present

## 2019-06-24 DIAGNOSIS — M1990 Unspecified osteoarthritis, unspecified site: Secondary | ICD-10-CM | POA: Diagnosis not present

## 2019-06-24 DIAGNOSIS — F339 Major depressive disorder, recurrent, unspecified: Secondary | ICD-10-CM | POA: Diagnosis not present

## 2019-06-24 DIAGNOSIS — I10 Essential (primary) hypertension: Secondary | ICD-10-CM | POA: Diagnosis not present

## 2019-06-24 DIAGNOSIS — R202 Paresthesia of skin: Secondary | ICD-10-CM | POA: Diagnosis not present

## 2019-06-26 DIAGNOSIS — Z23 Encounter for immunization: Secondary | ICD-10-CM | POA: Diagnosis not present

## 2019-07-08 DIAGNOSIS — F339 Major depressive disorder, recurrent, unspecified: Secondary | ICD-10-CM | POA: Diagnosis not present

## 2019-07-08 DIAGNOSIS — I10 Essential (primary) hypertension: Secondary | ICD-10-CM | POA: Diagnosis not present

## 2019-07-08 DIAGNOSIS — M479 Spondylosis, unspecified: Secondary | ICD-10-CM | POA: Diagnosis not present

## 2019-07-08 DIAGNOSIS — M069 Rheumatoid arthritis, unspecified: Secondary | ICD-10-CM | POA: Diagnosis not present

## 2019-07-08 DIAGNOSIS — R202 Paresthesia of skin: Secondary | ICD-10-CM | POA: Diagnosis not present

## 2019-07-08 DIAGNOSIS — E1142 Type 2 diabetes mellitus with diabetic polyneuropathy: Secondary | ICD-10-CM | POA: Diagnosis not present

## 2019-07-08 DIAGNOSIS — J4521 Mild intermittent asthma with (acute) exacerbation: Secondary | ICD-10-CM | POA: Diagnosis not present

## 2019-07-08 DIAGNOSIS — M1990 Unspecified osteoarthritis, unspecified site: Secondary | ICD-10-CM | POA: Diagnosis not present

## 2019-07-08 DIAGNOSIS — E782 Mixed hyperlipidemia: Secondary | ICD-10-CM | POA: Diagnosis not present

## 2019-07-08 DIAGNOSIS — N400 Enlarged prostate without lower urinary tract symptoms: Secondary | ICD-10-CM | POA: Diagnosis not present

## 2019-07-23 DIAGNOSIS — Z800 Family history of malignant neoplasm of digestive organs: Secondary | ICD-10-CM | POA: Diagnosis not present

## 2019-07-23 DIAGNOSIS — R1313 Dysphagia, pharyngeal phase: Secondary | ICD-10-CM | POA: Diagnosis not present

## 2019-07-23 DIAGNOSIS — K219 Gastro-esophageal reflux disease without esophagitis: Secondary | ICD-10-CM | POA: Diagnosis not present

## 2019-07-23 DIAGNOSIS — Z86010 Personal history of colonic polyps: Secondary | ICD-10-CM | POA: Diagnosis not present

## 2019-07-24 DIAGNOSIS — L821 Other seborrheic keratosis: Secondary | ICD-10-CM | POA: Diagnosis not present

## 2019-07-24 DIAGNOSIS — C44311 Basal cell carcinoma of skin of nose: Secondary | ICD-10-CM | POA: Diagnosis not present

## 2019-07-24 DIAGNOSIS — D485 Neoplasm of uncertain behavior of skin: Secondary | ICD-10-CM | POA: Diagnosis not present

## 2019-07-24 DIAGNOSIS — D0421 Carcinoma in situ of skin of right ear and external auricular canal: Secondary | ICD-10-CM | POA: Diagnosis not present

## 2019-07-24 DIAGNOSIS — L570 Actinic keratosis: Secondary | ICD-10-CM | POA: Diagnosis not present

## 2019-07-24 DIAGNOSIS — Z85828 Personal history of other malignant neoplasm of skin: Secondary | ICD-10-CM | POA: Diagnosis not present

## 2019-07-24 DIAGNOSIS — L820 Inflamed seborrheic keratosis: Secondary | ICD-10-CM | POA: Diagnosis not present

## 2019-08-05 DIAGNOSIS — Z86010 Personal history of colonic polyps: Secondary | ICD-10-CM | POA: Diagnosis not present

## 2019-08-25 DIAGNOSIS — Z85828 Personal history of other malignant neoplasm of skin: Secondary | ICD-10-CM | POA: Diagnosis not present

## 2019-08-25 DIAGNOSIS — C44311 Basal cell carcinoma of skin of nose: Secondary | ICD-10-CM | POA: Diagnosis not present

## 2019-09-16 DIAGNOSIS — E291 Testicular hypofunction: Secondary | ICD-10-CM | POA: Diagnosis not present

## 2019-09-16 DIAGNOSIS — R948 Abnormal results of function studies of other organs and systems: Secondary | ICD-10-CM | POA: Diagnosis not present

## 2019-09-17 DIAGNOSIS — M069 Rheumatoid arthritis, unspecified: Secondary | ICD-10-CM | POA: Diagnosis not present

## 2019-09-17 DIAGNOSIS — E1142 Type 2 diabetes mellitus with diabetic polyneuropathy: Secondary | ICD-10-CM | POA: Diagnosis not present

## 2019-09-17 DIAGNOSIS — M479 Spondylosis, unspecified: Secondary | ICD-10-CM | POA: Diagnosis not present

## 2019-09-17 DIAGNOSIS — I10 Essential (primary) hypertension: Secondary | ICD-10-CM | POA: Diagnosis not present

## 2019-09-17 DIAGNOSIS — F339 Major depressive disorder, recurrent, unspecified: Secondary | ICD-10-CM | POA: Diagnosis not present

## 2019-09-17 DIAGNOSIS — N400 Enlarged prostate without lower urinary tract symptoms: Secondary | ICD-10-CM | POA: Diagnosis not present

## 2019-09-17 DIAGNOSIS — M4720 Other spondylosis with radiculopathy, site unspecified: Secondary | ICD-10-CM | POA: Diagnosis not present

## 2019-09-17 DIAGNOSIS — E782 Mixed hyperlipidemia: Secondary | ICD-10-CM | POA: Diagnosis not present

## 2019-09-17 DIAGNOSIS — J4521 Mild intermittent asthma with (acute) exacerbation: Secondary | ICD-10-CM | POA: Diagnosis not present

## 2019-09-17 DIAGNOSIS — M1990 Unspecified osteoarthritis, unspecified site: Secondary | ICD-10-CM | POA: Diagnosis not present

## 2019-09-22 DIAGNOSIS — H401131 Primary open-angle glaucoma, bilateral, mild stage: Secondary | ICD-10-CM | POA: Diagnosis not present

## 2019-09-23 DIAGNOSIS — N5201 Erectile dysfunction due to arterial insufficiency: Secondary | ICD-10-CM | POA: Diagnosis not present

## 2019-09-23 DIAGNOSIS — E291 Testicular hypofunction: Secondary | ICD-10-CM | POA: Diagnosis not present

## 2019-09-25 DIAGNOSIS — E782 Mixed hyperlipidemia: Secondary | ICD-10-CM | POA: Diagnosis not present

## 2019-09-25 DIAGNOSIS — E1142 Type 2 diabetes mellitus with diabetic polyneuropathy: Secondary | ICD-10-CM | POA: Diagnosis not present

## 2019-09-25 DIAGNOSIS — M069 Rheumatoid arthritis, unspecified: Secondary | ICD-10-CM | POA: Diagnosis not present

## 2019-09-25 DIAGNOSIS — N400 Enlarged prostate without lower urinary tract symptoms: Secondary | ICD-10-CM | POA: Diagnosis not present

## 2019-09-25 DIAGNOSIS — M479 Spondylosis, unspecified: Secondary | ICD-10-CM | POA: Diagnosis not present

## 2019-09-25 DIAGNOSIS — R202 Paresthesia of skin: Secondary | ICD-10-CM | POA: Diagnosis not present

## 2019-09-25 DIAGNOSIS — F339 Major depressive disorder, recurrent, unspecified: Secondary | ICD-10-CM | POA: Diagnosis not present

## 2019-09-25 DIAGNOSIS — I10 Essential (primary) hypertension: Secondary | ICD-10-CM | POA: Diagnosis not present

## 2019-10-21 DIAGNOSIS — R202 Paresthesia of skin: Secondary | ICD-10-CM | POA: Diagnosis not present

## 2019-10-21 DIAGNOSIS — N400 Enlarged prostate without lower urinary tract symptoms: Secondary | ICD-10-CM | POA: Diagnosis not present

## 2019-10-21 DIAGNOSIS — I10 Essential (primary) hypertension: Secondary | ICD-10-CM | POA: Diagnosis not present

## 2019-10-21 DIAGNOSIS — E782 Mixed hyperlipidemia: Secondary | ICD-10-CM | POA: Diagnosis not present

## 2019-10-21 DIAGNOSIS — E1142 Type 2 diabetes mellitus with diabetic polyneuropathy: Secondary | ICD-10-CM | POA: Diagnosis not present

## 2019-10-21 DIAGNOSIS — F339 Major depressive disorder, recurrent, unspecified: Secondary | ICD-10-CM | POA: Diagnosis not present

## 2019-10-21 DIAGNOSIS — M479 Spondylosis, unspecified: Secondary | ICD-10-CM | POA: Diagnosis not present

## 2019-10-21 DIAGNOSIS — M069 Rheumatoid arthritis, unspecified: Secondary | ICD-10-CM | POA: Diagnosis not present

## 2019-10-22 DIAGNOSIS — N400 Enlarged prostate without lower urinary tract symptoms: Secondary | ICD-10-CM | POA: Diagnosis not present

## 2019-10-22 DIAGNOSIS — M479 Spondylosis, unspecified: Secondary | ICD-10-CM | POA: Diagnosis not present

## 2019-10-22 DIAGNOSIS — I10 Essential (primary) hypertension: Secondary | ICD-10-CM | POA: Diagnosis not present

## 2019-10-22 DIAGNOSIS — M069 Rheumatoid arthritis, unspecified: Secondary | ICD-10-CM | POA: Diagnosis not present

## 2019-10-22 DIAGNOSIS — R202 Paresthesia of skin: Secondary | ICD-10-CM | POA: Diagnosis not present

## 2019-10-22 DIAGNOSIS — F339 Major depressive disorder, recurrent, unspecified: Secondary | ICD-10-CM | POA: Diagnosis not present

## 2019-10-22 DIAGNOSIS — E782 Mixed hyperlipidemia: Secondary | ICD-10-CM | POA: Diagnosis not present

## 2019-10-22 DIAGNOSIS — E1142 Type 2 diabetes mellitus with diabetic polyneuropathy: Secondary | ICD-10-CM | POA: Diagnosis not present

## 2019-11-03 DIAGNOSIS — M1990 Unspecified osteoarthritis, unspecified site: Secondary | ICD-10-CM | POA: Diagnosis not present

## 2019-11-03 DIAGNOSIS — N400 Enlarged prostate without lower urinary tract symptoms: Secondary | ICD-10-CM | POA: Diagnosis not present

## 2019-11-03 DIAGNOSIS — I10 Essential (primary) hypertension: Secondary | ICD-10-CM | POA: Diagnosis not present

## 2019-11-03 DIAGNOSIS — F339 Major depressive disorder, recurrent, unspecified: Secondary | ICD-10-CM | POA: Diagnosis not present

## 2019-11-03 DIAGNOSIS — E782 Mixed hyperlipidemia: Secondary | ICD-10-CM | POA: Diagnosis not present

## 2019-11-03 DIAGNOSIS — M4720 Other spondylosis with radiculopathy, site unspecified: Secondary | ICD-10-CM | POA: Diagnosis not present

## 2019-11-03 DIAGNOSIS — E1142 Type 2 diabetes mellitus with diabetic polyneuropathy: Secondary | ICD-10-CM | POA: Diagnosis not present

## 2019-11-03 DIAGNOSIS — J4521 Mild intermittent asthma with (acute) exacerbation: Secondary | ICD-10-CM | POA: Diagnosis not present

## 2019-11-03 DIAGNOSIS — M069 Rheumatoid arthritis, unspecified: Secondary | ICD-10-CM | POA: Diagnosis not present

## 2019-11-03 DIAGNOSIS — M479 Spondylosis, unspecified: Secondary | ICD-10-CM | POA: Diagnosis not present

## 2019-11-12 DIAGNOSIS — F339 Major depressive disorder, recurrent, unspecified: Secondary | ICD-10-CM | POA: Diagnosis not present

## 2019-11-12 DIAGNOSIS — M069 Rheumatoid arthritis, unspecified: Secondary | ICD-10-CM | POA: Diagnosis not present

## 2019-11-12 DIAGNOSIS — Z7984 Long term (current) use of oral hypoglycemic drugs: Secondary | ICD-10-CM | POA: Diagnosis not present

## 2019-11-12 DIAGNOSIS — E1142 Type 2 diabetes mellitus with diabetic polyneuropathy: Secondary | ICD-10-CM | POA: Diagnosis not present

## 2019-11-12 DIAGNOSIS — I10 Essential (primary) hypertension: Secondary | ICD-10-CM | POA: Diagnosis not present

## 2019-11-12 DIAGNOSIS — J4521 Mild intermittent asthma with (acute) exacerbation: Secondary | ICD-10-CM | POA: Diagnosis not present

## 2019-11-12 DIAGNOSIS — M479 Spondylosis, unspecified: Secondary | ICD-10-CM | POA: Diagnosis not present

## 2019-11-12 DIAGNOSIS — N400 Enlarged prostate without lower urinary tract symptoms: Secondary | ICD-10-CM | POA: Diagnosis not present

## 2019-11-12 DIAGNOSIS — M4720 Other spondylosis with radiculopathy, site unspecified: Secondary | ICD-10-CM | POA: Diagnosis not present

## 2019-11-12 DIAGNOSIS — M1990 Unspecified osteoarthritis, unspecified site: Secondary | ICD-10-CM | POA: Diagnosis not present

## 2019-11-12 DIAGNOSIS — E782 Mixed hyperlipidemia: Secondary | ICD-10-CM | POA: Diagnosis not present

## 2019-12-11 ENCOUNTER — Ambulatory Visit: Payer: PPO

## 2019-12-11 ENCOUNTER — Ambulatory Visit: Payer: PPO | Attending: Internal Medicine

## 2019-12-11 DIAGNOSIS — Z23 Encounter for immunization: Secondary | ICD-10-CM | POA: Insufficient documentation

## 2019-12-11 NOTE — Progress Notes (Signed)
   Covid-19 Vaccination Clinic  Name:  DORRIS MATON    MRN: [id number] DOB: 1936/05/05  12/11/2019  Mr. Kleinert was observed post Covid-19 immunization for 15 minutes without incident. He was provided with Vaccine Information Sheet and instruction to access the V-Safe system.   Mr. Sapio was instructed to call 911 with any severe reactions post vaccine: Marland Kitchen Difficulty breathing  . Swelling of face and throat  . A fast heartbeat  . A bad rash all over body  . Dizziness and weakness

## 2019-12-22 DIAGNOSIS — H401111 Primary open-angle glaucoma, right eye, mild stage: Secondary | ICD-10-CM | POA: Diagnosis not present

## 2020-01-06 ENCOUNTER — Ambulatory Visit: Payer: PPO | Attending: Internal Medicine

## 2020-01-06 DIAGNOSIS — Z23 Encounter for immunization: Secondary | ICD-10-CM

## 2020-01-06 NOTE — Progress Notes (Signed)
   Covid-19 Vaccination Clinic  Name:  Seth Schwartz    MRN: [id number] DOB: [birth year]  01/06/2020  [name] was observed post Covid-19 immunization for 15 minutes without incident. He was provided with Vaccine Information Sheet and instruction to access the V-Safe system.   [name] was instructed to call 911 with any severe reactions post vaccine: Marland Kitchen Difficulty breathing  . Swelling of face and throat  . A fast heartbeat  . A bad rash all over body  . Dizziness and weakness   Immunizations Administered    Name Date Dose VIS Date Route   Pfizer COVID-19 Vaccine 01/06/2020  1:58 PM 0.3 mL 09/19/2019 Intramuscular   Manufacturer: [employer]   Lot: [id number]   Gallia: [id number]

## 2020-01-07 DIAGNOSIS — I10 Essential (primary) hypertension: Secondary | ICD-10-CM | POA: Diagnosis not present

## 2020-01-07 DIAGNOSIS — J4521 Mild intermittent asthma with (acute) exacerbation: Secondary | ICD-10-CM | POA: Diagnosis not present

## 2020-01-07 DIAGNOSIS — M1990 Unspecified osteoarthritis, unspecified site: Secondary | ICD-10-CM | POA: Diagnosis not present

## 2020-01-07 DIAGNOSIS — F339 Major depressive disorder, recurrent, unspecified: Secondary | ICD-10-CM | POA: Diagnosis not present

## 2020-01-07 DIAGNOSIS — M069 Rheumatoid arthritis, unspecified: Secondary | ICD-10-CM | POA: Diagnosis not present

## 2020-01-07 DIAGNOSIS — N400 Enlarged prostate without lower urinary tract symptoms: Secondary | ICD-10-CM | POA: Diagnosis not present

## 2020-01-07 DIAGNOSIS — M479 Spondylosis, unspecified: Secondary | ICD-10-CM | POA: Diagnosis not present

## 2020-01-07 DIAGNOSIS — M4720 Other spondylosis with radiculopathy, site unspecified: Secondary | ICD-10-CM | POA: Diagnosis not present

## 2020-01-07 DIAGNOSIS — E782 Mixed hyperlipidemia: Secondary | ICD-10-CM | POA: Diagnosis not present

## 2020-01-07 DIAGNOSIS — E1142 Type 2 diabetes mellitus with diabetic polyneuropathy: Secondary | ICD-10-CM | POA: Diagnosis not present

## 2020-02-17 DIAGNOSIS — M069 Rheumatoid arthritis, unspecified: Secondary | ICD-10-CM | POA: Diagnosis not present

## 2020-02-17 DIAGNOSIS — M4720 Other spondylosis with radiculopathy, site unspecified: Secondary | ICD-10-CM | POA: Diagnosis not present

## 2020-02-17 DIAGNOSIS — E782 Mixed hyperlipidemia: Secondary | ICD-10-CM | POA: Diagnosis not present

## 2020-02-17 DIAGNOSIS — J4521 Mild intermittent asthma with (acute) exacerbation: Secondary | ICD-10-CM | POA: Diagnosis not present

## 2020-02-17 DIAGNOSIS — R5383 Other fatigue: Secondary | ICD-10-CM | POA: Diagnosis not present

## 2020-02-17 DIAGNOSIS — I10 Essential (primary) hypertension: Secondary | ICD-10-CM | POA: Diagnosis not present

## 2020-02-17 DIAGNOSIS — E1142 Type 2 diabetes mellitus with diabetic polyneuropathy: Secondary | ICD-10-CM | POA: Diagnosis not present

## 2020-02-17 DIAGNOSIS — M1990 Unspecified osteoarthritis, unspecified site: Secondary | ICD-10-CM | POA: Diagnosis not present

## 2020-02-17 DIAGNOSIS — M479 Spondylosis, unspecified: Secondary | ICD-10-CM | POA: Diagnosis not present

## 2020-02-17 DIAGNOSIS — N400 Enlarged prostate without lower urinary tract symptoms: Secondary | ICD-10-CM | POA: Diagnosis not present

## 2020-02-17 DIAGNOSIS — F339 Major depressive disorder, recurrent, unspecified: Secondary | ICD-10-CM | POA: Diagnosis not present

## 2020-02-18 DIAGNOSIS — M4720 Other spondylosis with radiculopathy, site unspecified: Secondary | ICD-10-CM | POA: Diagnosis not present

## 2020-02-18 DIAGNOSIS — E782 Mixed hyperlipidemia: Secondary | ICD-10-CM | POA: Diagnosis not present

## 2020-02-18 DIAGNOSIS — R5383 Other fatigue: Secondary | ICD-10-CM | POA: Diagnosis not present

## 2020-02-18 DIAGNOSIS — E1142 Type 2 diabetes mellitus with diabetic polyneuropathy: Secondary | ICD-10-CM | POA: Diagnosis not present

## 2020-02-18 DIAGNOSIS — J4521 Mild intermittent asthma with (acute) exacerbation: Secondary | ICD-10-CM | POA: Diagnosis not present

## 2020-02-18 DIAGNOSIS — I10 Essential (primary) hypertension: Secondary | ICD-10-CM | POA: Diagnosis not present

## 2020-02-18 DIAGNOSIS — F339 Major depressive disorder, recurrent, unspecified: Secondary | ICD-10-CM | POA: Diagnosis not present

## 2020-02-18 DIAGNOSIS — M479 Spondylosis, unspecified: Secondary | ICD-10-CM | POA: Diagnosis not present

## 2020-02-18 DIAGNOSIS — N400 Enlarged prostate without lower urinary tract symptoms: Secondary | ICD-10-CM | POA: Diagnosis not present

## 2020-02-18 DIAGNOSIS — M1990 Unspecified osteoarthritis, unspecified site: Secondary | ICD-10-CM | POA: Diagnosis not present

## 2020-03-16 DIAGNOSIS — R948 Abnormal results of function studies of other organs and systems: Secondary | ICD-10-CM | POA: Diagnosis not present

## 2020-03-16 DIAGNOSIS — E291 Testicular hypofunction: Secondary | ICD-10-CM | POA: Diagnosis not present

## 2020-04-06 DIAGNOSIS — R351 Nocturia: Secondary | ICD-10-CM | POA: Diagnosis not present

## 2020-04-06 DIAGNOSIS — E291 Testicular hypofunction: Secondary | ICD-10-CM | POA: Diagnosis not present

## 2020-04-13 DIAGNOSIS — E119 Type 2 diabetes mellitus without complications: Secondary | ICD-10-CM | POA: Diagnosis not present

## 2020-04-19 DIAGNOSIS — M4720 Other spondylosis with radiculopathy, site unspecified: Secondary | ICD-10-CM | POA: Diagnosis not present

## 2020-04-19 DIAGNOSIS — J4521 Mild intermittent asthma with (acute) exacerbation: Secondary | ICD-10-CM | POA: Diagnosis not present

## 2020-04-19 DIAGNOSIS — M069 Rheumatoid arthritis, unspecified: Secondary | ICD-10-CM | POA: Diagnosis not present

## 2020-04-19 DIAGNOSIS — M479 Spondylosis, unspecified: Secondary | ICD-10-CM | POA: Diagnosis not present

## 2020-04-19 DIAGNOSIS — E782 Mixed hyperlipidemia: Secondary | ICD-10-CM | POA: Diagnosis not present

## 2020-04-19 DIAGNOSIS — E1142 Type 2 diabetes mellitus with diabetic polyneuropathy: Secondary | ICD-10-CM | POA: Diagnosis not present

## 2020-04-19 DIAGNOSIS — F339 Major depressive disorder, recurrent, unspecified: Secondary | ICD-10-CM | POA: Diagnosis not present

## 2020-04-19 DIAGNOSIS — M1990 Unspecified osteoarthritis, unspecified site: Secondary | ICD-10-CM | POA: Diagnosis not present

## 2020-04-19 DIAGNOSIS — N400 Enlarged prostate without lower urinary tract symptoms: Secondary | ICD-10-CM | POA: Diagnosis not present

## 2020-04-19 DIAGNOSIS — I10 Essential (primary) hypertension: Secondary | ICD-10-CM | POA: Diagnosis not present

## 2020-06-17 DIAGNOSIS — Z23 Encounter for immunization: Secondary | ICD-10-CM | POA: Diagnosis not present

## 2020-06-17 DIAGNOSIS — M1990 Unspecified osteoarthritis, unspecified site: Secondary | ICD-10-CM | POA: Diagnosis not present

## 2020-06-17 DIAGNOSIS — F339 Major depressive disorder, recurrent, unspecified: Secondary | ICD-10-CM | POA: Diagnosis not present

## 2020-06-17 DIAGNOSIS — E1142 Type 2 diabetes mellitus with diabetic polyneuropathy: Secondary | ICD-10-CM | POA: Diagnosis not present

## 2020-06-17 DIAGNOSIS — M542 Cervicalgia: Secondary | ICD-10-CM | POA: Diagnosis not present

## 2020-06-17 DIAGNOSIS — M479 Spondylosis, unspecified: Secondary | ICD-10-CM | POA: Diagnosis not present

## 2020-06-17 DIAGNOSIS — J4521 Mild intermittent asthma with (acute) exacerbation: Secondary | ICD-10-CM | POA: Diagnosis not present

## 2020-06-17 DIAGNOSIS — I10 Essential (primary) hypertension: Secondary | ICD-10-CM | POA: Diagnosis not present

## 2020-06-17 DIAGNOSIS — E782 Mixed hyperlipidemia: Secondary | ICD-10-CM | POA: Diagnosis not present

## 2020-06-17 DIAGNOSIS — R040 Epistaxis: Secondary | ICD-10-CM | POA: Diagnosis not present

## 2020-06-17 DIAGNOSIS — N400 Enlarged prostate without lower urinary tract symptoms: Secondary | ICD-10-CM | POA: Diagnosis not present

## 2020-06-17 DIAGNOSIS — M4720 Other spondylosis with radiculopathy, site unspecified: Secondary | ICD-10-CM | POA: Diagnosis not present

## 2020-06-24 ENCOUNTER — Other Ambulatory Visit: Payer: Self-pay | Admitting: Family Medicine

## 2020-06-24 DIAGNOSIS — M5412 Radiculopathy, cervical region: Secondary | ICD-10-CM

## 2020-06-28 DIAGNOSIS — F339 Major depressive disorder, recurrent, unspecified: Secondary | ICD-10-CM | POA: Diagnosis not present

## 2020-06-28 DIAGNOSIS — J4521 Mild intermittent asthma with (acute) exacerbation: Secondary | ICD-10-CM | POA: Diagnosis not present

## 2020-06-28 DIAGNOSIS — N400 Enlarged prostate without lower urinary tract symptoms: Secondary | ICD-10-CM | POA: Diagnosis not present

## 2020-06-28 DIAGNOSIS — M1990 Unspecified osteoarthritis, unspecified site: Secondary | ICD-10-CM | POA: Diagnosis not present

## 2020-06-28 DIAGNOSIS — K219 Gastro-esophageal reflux disease without esophagitis: Secondary | ICD-10-CM | POA: Diagnosis not present

## 2020-06-28 DIAGNOSIS — I10 Essential (primary) hypertension: Secondary | ICD-10-CM | POA: Diagnosis not present

## 2020-06-28 DIAGNOSIS — E1142 Type 2 diabetes mellitus with diabetic polyneuropathy: Secondary | ICD-10-CM | POA: Diagnosis not present

## 2020-06-28 DIAGNOSIS — M069 Rheumatoid arthritis, unspecified: Secondary | ICD-10-CM | POA: Diagnosis not present

## 2020-06-28 DIAGNOSIS — M4720 Other spondylosis with radiculopathy, site unspecified: Secondary | ICD-10-CM | POA: Diagnosis not present

## 2020-06-28 DIAGNOSIS — M479 Spondylosis, unspecified: Secondary | ICD-10-CM | POA: Diagnosis not present

## 2020-06-28 DIAGNOSIS — E782 Mixed hyperlipidemia: Secondary | ICD-10-CM | POA: Diagnosis not present

## 2020-07-01 ENCOUNTER — Other Ambulatory Visit: Payer: Self-pay

## 2020-07-01 ENCOUNTER — Ambulatory Visit
Admission: RE | Admit: 2020-07-01 | Discharge: 2020-07-01 | Disposition: A | Discharge status: Home / Self Care | Payer: PPO | Source: Ambulatory Visit | Attending: Family Medicine | Admitting: Family Medicine

## 2020-07-01 DIAGNOSIS — M4802 Spinal stenosis, cervical region: Secondary | ICD-10-CM | POA: Diagnosis not present

## 2020-07-01 DIAGNOSIS — M5412 Radiculopathy, cervical region: Secondary | ICD-10-CM

## 2020-07-09 DIAGNOSIS — M47812 Spondylosis without myelopathy or radiculopathy, cervical region: Secondary | ICD-10-CM | POA: Diagnosis not present

## 2020-07-12 ENCOUNTER — Other Ambulatory Visit: Payer: Self-pay | Admitting: Neurological Surgery

## 2020-07-21 NOTE — Progress Notes (Signed)
Your procedure is scheduled on Monday, July 26, 2020.  Report to [facility] Main Entrance "A" at 5:30 A.M., and check in at the Admitting office.  Call this number if you have problems the morning of surgery:  [phone]  Call [phone] if you have any questions prior to your surgery date Monday-Friday 8am-4pm    Remember:  Do not eat or drink after midnight the night before your surgery     Take these medicines the morning of surgery with A SIP OF WATER:  cetirizine (ZYRTEC) fluvoxaMINE (LUVOX) gabapentin (NEURONTIN) HYDROcodone-acetaminophen (NORCO) rosuvastatin (CRESTOR) budesonide-formoterol (SYMBICORT) - if needed  As of today, STOP taking any Aspirin (unless otherwise instructed by your surgeon) Aleve, Naproxen, Ibuprofen, Motrin, Advil, Goody's, BC's, all herbal medications, fish oil, and all vitamins.   WHAT DO I DO ABOUT MY DIABETES MEDICATION?   Marland Kitchen Do not take oral diabetes medicines (metFORMIN (GLUCOPHAGE)) the morning of surgery.   HOW TO MANAGE YOUR DIABETES BEFORE AND AFTER SURGERY  Why is it important to control my blood sugar before and after surgery? . Improving blood sugar levels before and after surgery helps healing and can limit problems. . A way of improving blood sugar control is eating a healthy diet by: o  Eating less sugar and carbohydrates o  Increasing activity/exercise o  Talking with your doctor about reaching your blood sugar goals . High blood sugars (greater than 180 mg/dL) can raise your risk of infections and slow your recovery, so you will need to focus on controlling your diabetes during the weeks before surgery. . Make sure that the doctor who takes care of your diabetes knows about your planned surgery including the date and location.  How do I manage my blood sugar before surgery? . Check your blood sugar at least 4 times a day, starting 2 days before surgery, to make sure that the level is not too high or low. . Check your  blood sugar the morning of your surgery when you wake up and every 2 hours until you get to the Short Stay unit. o If your blood sugar is less than 70 mg/dL, you will need to treat for low blood sugar: - Do not take insulin. - Treat a low blood sugar (less than 70 mg/dL) with  cup of clear juice (cranberry or apple), 4 glucose tablets, OR glucose gel. - Recheck blood sugar in 15 minutes after treatment (to make sure it is greater than 70 mg/dL). If your blood sugar is not greater than 70 mg/dL on recheck, call [phone] for further instructions. . Report your blood sugar to the short stay nurse when you get to Short Stay.  . If you are admitted to the hospital after surgery: o Your blood sugar will be checked by the staff and you will probably be given insulin after surgery (instead of oral diabetes medicines) to make sure you have good blood sugar levels. o The goal for blood sugar control after surgery is 80-180 mg/dL.                      Do not wear jewelry.            Do not wear lotions, powders, colognes, or deodorant.            Do not shave 48 hours prior to surgery. Men may shave face and neck.            Do not bring valuables to the hospital.  [facility] is not responsible for any belongings or valuables.  Do NOT Smoke (Tobacco/Vaping) or drink Alcohol 24 hours prior to your procedure If you use a CPAP at night, you may bring all equipment for your overnight stay.   Contacts, glasses, dentures or bridgework may not be worn into surgery.      For patients admitted to the hospital, discharge time will be determined by your treatment team.   Patients discharged the day of surgery will not be allowed to drive home, and someone needs to stay with them for 24 hours.    Special instructions:   [place]- Preparing For Surgery  Before surgery, you can play an important role. Because skin is not sterile, your skin needs to be as free of germs as possible. You can  reduce the number of germs on your skin by washing with CHG (chlorahexidine gluconate) Soap before surgery.  CHG is an antiseptic cleaner which kills germs and bonds with the skin to continue killing germs even after washing.    Oral Hygiene is also important to reduce your risk of infection.  Remember - BRUSH YOUR TEETH THE MORNING OF SURGERY WITH YOUR REGULAR TOOTHPASTE  Please do not use if you have an allergy to CHG or antibacterial soaps. If your skin becomes reddened/irritated stop using the CHG.  Do not shave (including legs and underarms) for at least 48 hours prior to first CHG shower. It is OK to shave your face.  Please follow these instructions carefully.   1. Shower the NIGHT BEFORE SURGERY and the MORNING OF SURGERY with CHG Soap.   2. If you chose to wash your hair, wash your hair first as usual with your normal shampoo.  3. After you shampoo, rinse your hair and body thoroughly to remove the shampoo.  4. Use CHG as you would any other liquid soap. You can apply CHG directly to the skin and wash gently with a scrungie or a clean washcloth.   5. Apply the CHG Soap to your body ONLY FROM THE NECK DOWN.  Do not use on open wounds or open sores. Avoid contact with your eyes, ears, mouth and genitals (private parts). Wash Face and genitals (private parts)  with your normal soap.   6. Wash thoroughly, paying special attention to the area where your surgery will be performed.  7. Thoroughly rinse your body with warm water from the neck down.  8. DO NOT shower/wash with your normal soap after using and rinsing off the CHG Soap.  9. Pat yourself dry with a CLEAN TOWEL.  10. Wear CLEAN PAJAMAS to bed the night before surgery  11. Place CLEAN SHEETS on your bed the night of your first shower and DO NOT SLEEP WITH PETS.   Day of Surgery: Wear Clean/Comfortable clothing the morning of surgery Do not apply any deodorants/lotions.   Remember to brush your teeth WITH YOUR REGULAR  TOOTHPASTE.   Please read over the following fact sheets that you were given.

## 2020-07-22 ENCOUNTER — Encounter (HOSPITAL_COMMUNITY): Payer: Self-pay

## 2020-07-22 ENCOUNTER — Other Ambulatory Visit: Payer: Self-pay

## 2020-07-22 ENCOUNTER — Encounter (HOSPITAL_COMMUNITY)
Admission: RE | Admit: 2020-07-22 | Discharge: 2020-07-22 | Disposition: A | Discharge status: Home / Self Care | Payer: PPO | Source: Ambulatory Visit | Attending: Neurological Surgery | Admitting: Neurological Surgery

## 2020-07-22 ENCOUNTER — Other Ambulatory Visit (HOSPITAL_COMMUNITY)
Admission: RE | Admit: 2020-07-22 | Discharge: 2020-07-22 | Disposition: A | Discharge status: Home / Self Care | Payer: PPO | Source: Ambulatory Visit | Attending: Neurological Surgery | Admitting: Neurological Surgery

## 2020-07-22 DIAGNOSIS — I10 Essential (primary) hypertension: Secondary | ICD-10-CM | POA: Insufficient documentation

## 2020-07-22 DIAGNOSIS — E118 Type 2 diabetes mellitus with unspecified complications: Secondary | ICD-10-CM | POA: Diagnosis not present

## 2020-07-22 DIAGNOSIS — Z01818 Encounter for other preprocedural examination: Secondary | ICD-10-CM | POA: Diagnosis not present

## 2020-07-22 DIAGNOSIS — Z20822 Contact with and (suspected) exposure to covid-19: Secondary | ICD-10-CM | POA: Insufficient documentation

## 2020-07-22 DIAGNOSIS — Z01812 Encounter for preprocedural laboratory examination: Secondary | ICD-10-CM | POA: Insufficient documentation

## 2020-07-22 HISTORY — DX: Unspecified asthma, uncomplicated: J45.909

## 2020-07-22 LAB — CBC
HCT: 50.1 % (ref 39.0–52.0)
Hemoglobin: 16.2 g/dL (ref 13.0–17.0)
MCH: 28.7 pg (ref 26.0–34.0)
MCHC: 32.3 g/dL (ref 30.0–36.0)
MCV: 88.8 fL (ref 80.0–100.0)
Platelets: 186 10*3/uL (ref 150–400)
RBC: 5.64 MIL/uL (ref 4.22–5.81)
RDW: 13.8 % (ref 11.5–15.5)
WBC: 6.9 10*3/uL (ref 4.0–10.5)
nRBC: 0 % (ref 0.0–0.2)

## 2020-07-22 LAB — BASIC METABOLIC PANEL
Anion gap: 11 (ref 5–15)
BUN: 17 mg/dL (ref 8–23)
CO2: 25 mmol/L (ref 22–32)
Calcium: 9 mg/dL (ref 8.9–10.3)
Chloride: 103 mmol/L (ref 98–111)
Creatinine, Ser: 1.36 mg/dL — ABNORMAL HIGH (ref 0.61–1.24)
GFR, Estimated: 48 mL/min — ABNORMAL LOW (ref >60)
Glucose, Bld: 118 mg/dL — ABNORMAL HIGH (ref 70–99)
Potassium: 4.4 mmol/L (ref 3.5–5.1)
Sodium: 139 mmol/L (ref 135–145)

## 2020-07-22 LAB — SARS CORONAVIRUS 2 (TAT 6-24 HRS): SARS Coronavirus 2: NEGATIVE

## 2020-07-22 LAB — SURGICAL PCR SCREEN
MRSA, PCR: NEGATIVE
Staphylococcus aureus: NEGATIVE

## 2020-07-22 LAB — GLUCOSE, CAPILLARY: Glucose-Capillary: 141 mg/dL — ABNORMAL HIGH (ref 70–99)

## 2020-07-22 LAB — TYPE AND SCREEN
ABO/RH(D): O POS
Antibody Screen: NEGATIVE

## 2020-07-22 NOTE — Progress Notes (Signed)
PCP - Yaakov Guthrie  Chest x-ray - n/a EKG - 07-22-20  DM - Type 2 Fasting Blood Sugar - 120   COVID TEST- 07-22-20   Anesthesia review: n/a  Patient denies shortness of breath, fever, cough and chest pain at PAT appointment   All instructions explained to the patient, with a verbal understanding of the material. Patient agrees to go over the instructions while at home for a better understanding. Patient also instructed to self quarantine after being tested for COVID-19. The opportunity to ask questions was provided.

## 2020-07-23 LAB — HEMOGLOBIN A1C
Hgb A1c MFr Bld: 7.4 % — ABNORMAL HIGH (ref 4.8–5.6)
Mean Plasma Glucose: 166 mg/dL

## 2020-07-26 ENCOUNTER — Inpatient Hospital Stay (HOSPITAL_COMMUNITY): Payer: PPO | Admitting: Certified Registered"

## 2020-07-26 ENCOUNTER — Other Ambulatory Visit: Payer: Self-pay

## 2020-07-26 ENCOUNTER — Inpatient Hospital Stay (HOSPITAL_COMMUNITY): Payer: PPO

## 2020-07-26 ENCOUNTER — Encounter (HOSPITAL_COMMUNITY): Payer: Self-pay | Admitting: Neurological Surgery

## 2020-07-26 ENCOUNTER — Encounter (HOSPITAL_COMMUNITY)
Admission: RE | Disposition: A | Discharge status: Home / Self Care | Payer: Self-pay | Source: Home / Self Care | Attending: Neurological Surgery

## 2020-07-26 ENCOUNTER — Inpatient Hospital Stay (HOSPITAL_COMMUNITY)
Admission: RE | Admit: 2020-07-26 | Discharge: 2020-07-26 | DRG: 472 | Disposition: A | Discharge status: Home / Self Care | Payer: PPO | Attending: Neurological Surgery | Admitting: Neurological Surgery

## 2020-07-26 DIAGNOSIS — M545 Low back pain, unspecified: Secondary | ICD-10-CM | POA: Diagnosis not present

## 2020-07-26 DIAGNOSIS — M4712 Other spondylosis with myelopathy, cervical region: Secondary | ICD-10-CM | POA: Diagnosis present

## 2020-07-26 DIAGNOSIS — K219 Gastro-esophageal reflux disease without esophagitis: Secondary | ICD-10-CM | POA: Diagnosis present

## 2020-07-26 DIAGNOSIS — Z79890 Hormone replacement therapy (postmenopausal): Secondary | ICD-10-CM

## 2020-07-26 DIAGNOSIS — E785 Hyperlipidemia, unspecified: Secondary | ICD-10-CM | POA: Diagnosis present

## 2020-07-26 DIAGNOSIS — M4322 Fusion of spine, cervical region: Secondary | ICD-10-CM | POA: Diagnosis not present

## 2020-07-26 DIAGNOSIS — Z880 Allergy status to penicillin: Secondary | ICD-10-CM

## 2020-07-26 DIAGNOSIS — Z888 Allergy status to other drugs, medicaments and biological substances status: Secondary | ICD-10-CM

## 2020-07-26 DIAGNOSIS — K5900 Constipation, unspecified: Secondary | ICD-10-CM | POA: Diagnosis not present

## 2020-07-26 DIAGNOSIS — F32A Depression, unspecified: Secondary | ICD-10-CM | POA: Diagnosis not present

## 2020-07-26 DIAGNOSIS — E119 Type 2 diabetes mellitus without complications: Secondary | ICD-10-CM | POA: Diagnosis present

## 2020-07-26 DIAGNOSIS — J45909 Unspecified asthma, uncomplicated: Secondary | ICD-10-CM | POA: Diagnosis present

## 2020-07-26 DIAGNOSIS — M6728 Synovial hypertrophy, not elsewhere classified, other site: Secondary | ICD-10-CM | POA: Diagnosis present

## 2020-07-26 DIAGNOSIS — M4802 Spinal stenosis, cervical region: Secondary | ICD-10-CM | POA: Diagnosis not present

## 2020-07-26 DIAGNOSIS — I10 Essential (primary) hypertension: Secondary | ICD-10-CM | POA: Diagnosis present

## 2020-07-26 DIAGNOSIS — Z87891 Personal history of nicotine dependence: Secondary | ICD-10-CM

## 2020-07-26 DIAGNOSIS — Z79899 Other long term (current) drug therapy: Secondary | ICD-10-CM

## 2020-07-26 DIAGNOSIS — Z882 Allergy status to sulfonamides status: Secondary | ICD-10-CM | POA: Diagnosis not present

## 2020-07-26 DIAGNOSIS — Z419 Encounter for procedure for purposes other than remedying health state, unspecified: Secondary | ICD-10-CM

## 2020-07-26 DIAGNOSIS — Z7984 Long term (current) use of oral hypoglycemic drugs: Secondary | ICD-10-CM

## 2020-07-26 DIAGNOSIS — G8929 Other chronic pain: Secondary | ICD-10-CM | POA: Diagnosis present

## 2020-07-26 DIAGNOSIS — Z981 Arthrodesis status: Secondary | ICD-10-CM | POA: Diagnosis not present

## 2020-07-26 DIAGNOSIS — M4722 Other spondylosis with radiculopathy, cervical region: Secondary | ICD-10-CM | POA: Diagnosis not present

## 2020-07-26 DIAGNOSIS — M1288 Other specific arthropathies, not elsewhere classified, other specified site: Secondary | ICD-10-CM | POA: Diagnosis not present

## 2020-07-26 HISTORY — PX: POSTERIOR CERVICAL FUSION/FORAMINOTOMY: SHX5038

## 2020-07-26 LAB — GLUCOSE, CAPILLARY
Glucose-Capillary: 151 mg/dL — ABNORMAL HIGH (ref 70–99)
Glucose-Capillary: 136 mg/dL — ABNORMAL HIGH (ref 70–99)
Glucose-Capillary: 163 mg/dL — ABNORMAL HIGH (ref 70–99)
Glucose-Capillary: 201 mg/dL — ABNORMAL HIGH (ref 70–99)

## 2020-07-26 SURGERY — POSTERIOR CERVICAL FUSION/FORAMINOTOMY LEVEL 1
Anesthesia: General | Site: Spine Cervical

## 2020-07-26 MED ORDER — ONDANSETRON HCL 4 MG PO TABS: 4.0000 mg | ORAL_TABLET | Freq: Four times a day (QID) | ORAL | Status: DC | PRN

## 2020-07-26 MED ORDER — LIDOCAINE HCL (CARDIAC) PF 100 MG/5ML IV SOSY
PREFILLED_SYRINGE | INTRAVENOUS | Status: DC | PRN
  Administered 2020-07-26: 100 mg via INTRAVENOUS

## 2020-07-26 MED ORDER — ACETAMINOPHEN 650 MG RE SUPP: 650.0000 mg | RECTAL | Status: DC | PRN

## 2020-07-26 MED ORDER — VANCOMYCIN HCL 1000 MG IV SOLR
INTRAVENOUS | Status: DC | PRN
  Administered 2020-07-26: 1000 mg via INTRAVENOUS

## 2020-07-26 MED ORDER — ONDANSETRON HCL 4 MG/2ML IJ SOLN: 4.0000 mg | Freq: Once | INTRAMUSCULAR | Status: DC | PRN

## 2020-07-26 MED ORDER — BUPIVACAINE HCL (PF) 0.5 % IJ SOLN
INTRAMUSCULAR | Status: AC
  Filled 2020-07-26: qty 30

## 2020-07-26 MED ORDER — SODIUM CHLORIDE 0.9% FLUSH: 3.0000 mL | Freq: Two times a day (BID) | INTRAVENOUS | Status: DC

## 2020-07-26 MED ORDER — CHLORHEXIDINE GLUCONATE CLOTH 2 % EX PADS: 6.0000 | MEDICATED_PAD | Freq: Once | CUTANEOUS | Status: DC

## 2020-07-26 MED ORDER — DIAZEPAM 5 MG PO TABS: 5.0000 mg | ORAL_TABLET | Freq: Four times a day (QID) | ORAL | Status: DC | PRN

## 2020-07-26 MED ORDER — MENTHOL 3 MG MT LOZG: 1.0000 | LOZENGE | OROMUCOSAL | Status: DC | PRN

## 2020-07-26 MED ORDER — 0.9 % SODIUM CHLORIDE (POUR BTL) OPTIME
TOPICAL | Status: DC | PRN
  Administered 2020-07-26 (×2): 1000 mL

## 2020-07-26 MED ORDER — OXYCODONE HCL 5 MG/5ML PO SOLN: 5.0000 mg | Freq: Once | ORAL | Status: AC | PRN

## 2020-07-26 MED ORDER — DIAZEPAM 5 MG PO TABS
5.0000 mg | ORAL_TABLET | Freq: Four times a day (QID) | ORAL | Status: DC | PRN
  Administered 2020-07-26 (×2): 5 mg via ORAL
  Filled 2020-07-26 (×2): qty 1

## 2020-07-26 MED ORDER — PROPOFOL 10 MG/ML IV BOLUS
INTRAVENOUS | Status: AC
  Filled 2020-07-26: qty 20

## 2020-07-26 MED ORDER — PHENYLEPHRINE HCL-NACL 10-0.9 MG/250ML-% IV SOLN
INTRAVENOUS | Status: DC | PRN
  Administered 2020-07-26: 100 ug/min via INTRAVENOUS

## 2020-07-26 MED ORDER — ORAL CARE MOUTH RINSE: 15.0000 mL | Freq: Once | OROMUCOSAL | Status: AC

## 2020-07-26 MED ORDER — SODIUM CHLORIDE 0.9 % IV SOLN: 250.0000 mL | INTRAVENOUS | Status: DC

## 2020-07-26 MED ORDER — GLYCOPYRROLATE 0.2 MG/ML IJ SOLN
INTRAMUSCULAR | Status: DC | PRN
  Administered 2020-07-26: .2 mg via INTRAVENOUS

## 2020-07-26 MED ORDER — DOCUSATE SODIUM 100 MG PO CAPS
100.0000 mg | ORAL_CAPSULE | Freq: Two times a day (BID) | ORAL | Status: DC
  Administered 2020-07-26: 100 mg via ORAL

## 2020-07-26 MED ORDER — IRBESARTAN 150 MG PO TABS
150.0000 mg | ORAL_TABLET | Freq: Every day | ORAL | Status: DC
  Administered 2020-07-26: 150 mg via ORAL
  Filled 2020-07-26: qty 1

## 2020-07-26 MED ORDER — FLEET ENEMA 7-19 GM/118ML RE ENEM: 1.0000 | ENEMA | Freq: Once | RECTAL | Status: DC | PRN

## 2020-07-26 MED ORDER — THROMBIN 5000 UNITS EX SOLR
OROMUCOSAL | Status: DC | PRN
  Administered 2020-07-26: 5 mL via TOPICAL

## 2020-07-26 MED ORDER — INSULIN ASPART 100 UNIT/ML ~~LOC~~ SOLN: 0.0000 [IU] | Freq: Three times a day (TID) | SUBCUTANEOUS | Status: DC

## 2020-07-26 MED ORDER — ROSUVASTATIN CALCIUM 5 MG PO TABS
5.0000 mg | ORAL_TABLET | ORAL | Status: DC
  Administered 2020-07-26: 5 mg via ORAL
  Filled 2020-07-26: qty 1

## 2020-07-26 MED ORDER — SUGAMMADEX SODIUM 200 MG/2ML IV SOLN
INTRAVENOUS | Status: DC | PRN
  Administered 2020-07-26: 200 mg via INTRAVENOUS

## 2020-07-26 MED ORDER — POLYETHYLENE GLYCOL 3350 17 G PO PACK: 17.0000 g | PACK | Freq: Every day | ORAL | Status: DC | PRN

## 2020-07-26 MED ORDER — MIDAZOLAM HCL 2 MG/2ML IJ SOLN
INTRAMUSCULAR | Status: AC
  Filled 2020-07-26: qty 2

## 2020-07-26 MED ORDER — ACETAMINOPHEN 160 MG/5ML PO SOLN: 325.0000 mg | ORAL | Status: DC | PRN

## 2020-07-26 MED ORDER — PHENOL 1.4 % MT LIQD
1.0000 | OROMUCOSAL | Status: DC | PRN
  Administered 2020-07-26: 1 via OROMUCOSAL
  Filled 2020-07-26: qty 177

## 2020-07-26 MED ORDER — POVIDONE-IODINE 10 % EX OINT
TOPICAL_OINTMENT | CUTANEOUS | Status: DC | PRN
  Administered 2020-07-26: 1 via TOPICAL

## 2020-07-26 MED ORDER — PHENYLEPHRINE HCL (PRESSORS) 10 MG/ML IV SOLN
INTRAVENOUS | Status: DC | PRN
  Administered 2020-07-26 (×3): 120 ug via INTRAVENOUS

## 2020-07-26 MED ORDER — MOMETASONE FURO-FORMOTEROL FUM 200-5 MCG/ACT IN AERO
2.0000 | INHALATION_SPRAY | Freq: Two times a day (BID) | RESPIRATORY_TRACT | Status: DC
  Filled 2020-07-26: qty 8.8

## 2020-07-26 MED ORDER — HYDROCODONE-ACETAMINOPHEN 10-325 MG PO TABS: 1.0000 | ORAL_TABLET | ORAL | 0 refills | Status: AC | PRN

## 2020-07-26 MED ORDER — HYDROCODONE-ACETAMINOPHEN 5-325 MG PO TABS
1.0000 | ORAL_TABLET | ORAL | Status: DC | PRN
  Administered 2020-07-26 (×2): 2 via ORAL
  Filled 2020-07-26 (×2): qty 2

## 2020-07-26 MED ORDER — LIDOCAINE-EPINEPHRINE 1 %-1:100000 IJ SOLN
INTRAMUSCULAR | Status: DC | PRN
  Administered 2020-07-26: 5 mL

## 2020-07-26 MED ORDER — VANCOMYCIN HCL IN DEXTROSE 1-5 GM/200ML-% IV SOLN
1000.0000 mg | INTRAVENOUS | Status: AC
  Administered 2020-07-26: 1000 mg via INTRAVENOUS
  Filled 2020-07-26: qty 200

## 2020-07-26 MED ORDER — DEXAMETHASONE SODIUM PHOSPHATE 10 MG/ML IJ SOLN
INTRAMUSCULAR | Status: DC | PRN
  Administered 2020-07-26: 10 mg via INTRAVENOUS

## 2020-07-26 MED ORDER — TIMOLOL HEMIHYDRATE 0.5 % OP SOLN: 1.0000 [drp] | Freq: Every day | OPHTHALMIC | Status: DC

## 2020-07-26 MED ORDER — HYDROCHLOROTHIAZIDE 12.5 MG PO CAPS
12.5000 mg | ORAL_CAPSULE | Freq: Every day | ORAL | Status: DC
  Administered 2020-07-26: 12.5 mg via ORAL
  Filled 2020-07-26: qty 1

## 2020-07-26 MED ORDER — HYDROMORPHONE HCL 1 MG/ML IJ SOLN
INTRAMUSCULAR | Status: AC
  Filled 2020-07-26: qty 1

## 2020-07-26 MED ORDER — BUPIVACAINE HCL 0.5 % IJ SOLN
INTRAMUSCULAR | Status: DC | PRN
  Administered 2020-07-26: 5 mL

## 2020-07-26 MED ORDER — SODIUM CHLORIDE 0.9% FLUSH: 3.0000 mL | INTRAVENOUS | Status: DC | PRN

## 2020-07-26 MED ORDER — LACTATED RINGERS IV SOLN: INTRAVENOUS | Status: DC | PRN

## 2020-07-26 MED ORDER — SUFENTANIL CITRATE 50 MCG/ML IV SOLN
INTRAVENOUS | Status: DC | PRN
  Administered 2020-07-26 (×2): 10 ug via INTRAVENOUS

## 2020-07-26 MED ORDER — LIDOCAINE-EPINEPHRINE 1 %-1:100000 IJ SOLN
INTRAMUSCULAR | Status: AC
  Filled 2020-07-26: qty 1

## 2020-07-26 MED ORDER — FLUVOXAMINE MALEATE 50 MG PO TABS
50.0000 mg | ORAL_TABLET | Freq: Two times a day (BID) | ORAL | Status: DC
  Filled 2020-07-26: qty 1

## 2020-07-26 MED ORDER — TAMSULOSIN HCL 0.4 MG PO CAPS
0.4000 mg | ORAL_CAPSULE | Freq: Every day | ORAL | Status: DC
  Administered 2020-07-26: 0.4 mg via ORAL
  Filled 2020-07-26: qty 1

## 2020-07-26 MED ORDER — PANTOPRAZOLE SODIUM 40 MG PO TBEC
40.0000 mg | DELAYED_RELEASE_TABLET | Freq: Every day | ORAL | Status: DC
  Administered 2020-07-26: 40 mg via ORAL
  Filled 2020-07-26: qty 1

## 2020-07-26 MED ORDER — GABAPENTIN 300 MG PO CAPS
300.0000 mg | ORAL_CAPSULE | Freq: Three times a day (TID) | ORAL | Status: DC
  Administered 2020-07-26 (×2): 300 mg via ORAL
  Filled 2020-07-26 (×2): qty 1

## 2020-07-26 MED ORDER — LORATADINE 10 MG PO TABS
10.0000 mg | ORAL_TABLET | Freq: Every day | ORAL | Status: DC
  Administered 2020-07-26: 10 mg via ORAL
  Filled 2020-07-26: qty 1

## 2020-07-26 MED ORDER — OXYCODONE HCL 5 MG PO TABS
ORAL_TABLET | ORAL | Status: AC
  Filled 2020-07-26: qty 1

## 2020-07-26 MED ORDER — LACTATED RINGERS IV SOLN: INTRAVENOUS | Status: DC

## 2020-07-26 MED ORDER — CHLORHEXIDINE GLUCONATE 0.12 % MT SOLN
15.0000 mL | Freq: Once | OROMUCOSAL | Status: AC
  Administered 2020-07-26: 15 mL via OROMUCOSAL
  Filled 2020-07-26: qty 15

## 2020-07-26 MED ORDER — BISACODYL 10 MG RE SUPP: 10.0000 mg | Freq: Every day | RECTAL | Status: DC | PRN

## 2020-07-26 MED ORDER — SENNA 8.6 MG PO TABS
1.0000 | ORAL_TABLET | Freq: Two times a day (BID) | ORAL | Status: DC
  Administered 2020-07-26: 8.6 mg via ORAL
  Filled 2020-07-26: qty 1

## 2020-07-26 MED ORDER — POVIDONE-IODINE 10 % EX OINT
TOPICAL_OINTMENT | CUTANEOUS | Status: AC
  Filled 2020-07-26: qty 28.35

## 2020-07-26 MED ORDER — METFORMIN HCL 500 MG PO TABS
500.0000 mg | ORAL_TABLET | Freq: Two times a day (BID) | ORAL | Status: DC
  Administered 2020-07-26: 500 mg via ORAL
  Filled 2020-07-26: qty 1

## 2020-07-26 MED ORDER — MORPHINE SULFATE (PF) 2 MG/ML IV SOLN
2.0000 mg | INTRAVENOUS | Status: DC | PRN
  Administered 2020-07-26: 4 mg via INTRAVENOUS
  Filled 2020-07-26: qty 2

## 2020-07-26 MED ORDER — THROMBIN 5000 UNITS EX SOLR
CUTANEOUS | Status: AC
  Filled 2020-07-26: qty 5000

## 2020-07-26 MED ORDER — HYDROMORPHONE HCL 1 MG/ML IJ SOLN
INTRAMUSCULAR | Status: AC
  Administered 2020-07-26: 0.5 mg via INTRAVENOUS
  Filled 2020-07-26: qty 1

## 2020-07-26 MED ORDER — OXYCODONE HCL 5 MG PO TABS
5.0000 mg | ORAL_TABLET | Freq: Once | ORAL | Status: AC | PRN
  Administered 2020-07-26: 5 mg via ORAL

## 2020-07-26 MED ORDER — ROCURONIUM 10MG/ML (10ML) SYRINGE FOR MEDFUSION PUMP - OPTIME
INTRAVENOUS | Status: DC | PRN
  Administered 2020-07-26: 100 mg via INTRAVENOUS

## 2020-07-26 MED ORDER — GABAPENTIN 300 MG PO CAPS: 600.0000 mg | ORAL_CAPSULE | Freq: Every morning | ORAL | Status: DC

## 2020-07-26 MED ORDER — PROPOFOL 10 MG/ML IV BOLUS
INTRAVENOUS | Status: DC | PRN
  Administered 2020-07-26: 160 mg via INTRAVENOUS

## 2020-07-26 MED ORDER — IRBESARTAN-HYDROCHLOROTHIAZIDE 150-12.5 MG PO TABS: 1.0000 | ORAL_TABLET | Freq: Every day | ORAL | Status: DC

## 2020-07-26 MED ORDER — HYDROMORPHONE HCL 1 MG/ML IJ SOLN
0.2500 mg | INTRAMUSCULAR | Status: DC | PRN
  Administered 2020-07-26 (×2): 0.5 mg via INTRAVENOUS

## 2020-07-26 MED ORDER — ONDANSETRON HCL 4 MG/2ML IJ SOLN: 4.0000 mg | Freq: Four times a day (QID) | INTRAMUSCULAR | Status: DC | PRN

## 2020-07-26 MED ORDER — ACETAMINOPHEN 325 MG PO TABS: 650.0000 mg | ORAL_TABLET | ORAL | Status: DC | PRN

## 2020-07-26 MED ORDER — SUFENTANIL CITRATE 50 MCG/ML IV SOLN
INTRAVENOUS | Status: AC
  Filled 2020-07-26: qty 1

## 2020-07-26 MED ORDER — MIDAZOLAM HCL 2 MG/2ML IJ SOLN
INTRAMUSCULAR | Status: DC | PRN
  Administered 2020-07-26: 2 mg via INTRAVENOUS

## 2020-07-26 MED ORDER — ACETAMINOPHEN 325 MG PO TABS: 325.0000 mg | ORAL_TABLET | ORAL | Status: DC | PRN

## 2020-07-26 MED ORDER — DIAZEPAM 5 MG PO TABS: 5.0000 mg | ORAL_TABLET | Freq: Four times a day (QID) | ORAL | 0 refills | Status: AC | PRN

## 2020-07-26 MED ORDER — TIMOLOL MALEATE 0.5 % OP SOLN
1.0000 [drp] | Freq: Every day | OPHTHALMIC | Status: DC
  Filled 2020-07-26: qty 5

## 2020-07-26 MED ORDER — ALUM & MAG HYDROXIDE-SIMETH 200-200-20 MG/5ML PO SUSP: 30.0000 mL | Freq: Four times a day (QID) | ORAL | Status: DC | PRN

## 2020-07-26 MED ORDER — ONDANSETRON HCL 4 MG/2ML IJ SOLN
INTRAMUSCULAR | Status: DC | PRN
  Administered 2020-07-26: 4 mg via INTRAVENOUS

## 2020-07-26 SURGICAL SUPPLY — 73 items
ADH SKN CLS APL DERMABOND .7 (GAUZE/BANDAGES/DRESSINGS) ×1
APL SKNCLS STERI-STRIP NONHPOA (GAUZE/BANDAGES/DRESSINGS) ×1
BAND INSRT 18 STRL LF DISP RB (MISCELLANEOUS)
BAND RUBBER #18 3X1/16 STRL (MISCELLANEOUS) IMPLANT
BENZOIN TINCTURE PRP APPL 2/3 (GAUZE/BANDAGES/DRESSINGS) ×2 IMPLANT
BIT DRILL NEURO 2X3.1 SFT TUCH (MISCELLANEOUS) ×1 IMPLANT
BIT DRILL VUEPOINT II (BIT) IMPLANT
BLADE CLIPPER SURG (BLADE) ×2 IMPLANT
BLADE ULTRA TIP 2M (BLADE) IMPLANT
BONE MATRIX OSTEOCEL PRO MED (Bone Implant) ×2 IMPLANT
CANISTER SUCT 3000ML PPV (MISCELLANEOUS) ×3 IMPLANT
CLOSURE WOUND 1/2 X4 (GAUZE/BANDAGES/DRESSINGS)
COVER WAND RF STERILE (DRAPES) ×3 IMPLANT
DECANTER SPIKE VIAL GLASS SM (MISCELLANEOUS) ×3 IMPLANT
DERMABOND ADVANCED (GAUZE/BANDAGES/DRESSINGS) ×2
DERMABOND ADVANCED .7 DNX12 (GAUZE/BANDAGES/DRESSINGS) IMPLANT
DEVICE DISSECT PLASMABLAD 3.0S (MISCELLANEOUS) ×1 IMPLANT
DRAPE C-ARM 42X72 X-RAY (DRAPES) ×6 IMPLANT
DRAPE LAPAROTOMY 100X72 PEDS (DRAPES) ×3 IMPLANT
DRAPE MICROSCOPE LEICA (MISCELLANEOUS) IMPLANT
DRILL BIT VUEPOINT II (BIT) ×3
DRILL NEURO 2X3.1 SOFT TOUCH (MISCELLANEOUS) ×3
DURAPREP 26ML APPLICATOR (WOUND CARE) ×3 IMPLANT
ELECT REM PT RETURN 9FT ADLT (ELECTROSURGICAL) ×3
ELECTRODE REM PT RTRN 9FT ADLT (ELECTROSURGICAL) ×1 IMPLANT
GAUZE 4X4 16PLY RFD (DISPOSABLE) IMPLANT
GAUZE SPONGE 4X4 12PLY STRL (GAUZE/BANDAGES/DRESSINGS) IMPLANT
GLOVE BIO SURGEON STRL SZ 6.5 (GLOVE) ×2 IMPLANT
GLOVE BIO SURGEONS STRL SZ 6.5 (GLOVE) ×1
GLOVE BIOGEL PI IND STRL 6.5 (GLOVE) ×1 IMPLANT
GLOVE BIOGEL PI IND STRL 8 (GLOVE) IMPLANT
GLOVE BIOGEL PI IND STRL 8.5 (GLOVE) ×1 IMPLANT
GLOVE BIOGEL PI INDICATOR 6.5 (GLOVE) ×2
GLOVE BIOGEL PI INDICATOR 8 (GLOVE) ×8
GLOVE BIOGEL PI INDICATOR 8.5 (GLOVE) ×2
GLOVE ECLIPSE 7.5 STRL STRAW (GLOVE) ×6 IMPLANT
GLOVE ECLIPSE 8.5 STRL (GLOVE) ×3 IMPLANT
GLOVE EXAM NITRILE XL STR (GLOVE) IMPLANT
GOWN STRL REUS W/ TWL LRG LVL3 (GOWN DISPOSABLE) IMPLANT
GOWN STRL REUS W/ TWL XL LVL3 (GOWN DISPOSABLE) ×1 IMPLANT
GOWN STRL REUS W/TWL 2XL LVL3 (GOWN DISPOSABLE) ×7 IMPLANT
GOWN STRL REUS W/TWL LRG LVL3 (GOWN DISPOSABLE) ×3
GOWN STRL REUS W/TWL XL LVL3 (GOWN DISPOSABLE)
HEMOSTAT POWDER KIT SURGIFOAM (HEMOSTASIS) ×3 IMPLANT
HEMOSTAT SURGICEL 2X14 (HEMOSTASIS) IMPLANT
KIT BASIN OR (CUSTOM PROCEDURE TRAY) ×3 IMPLANT
KIT TURNOVER KIT B (KITS) ×3 IMPLANT
NDL SPNL 18GX3.5 QUINCKE PK (NEEDLE) IMPLANT
NEEDLE HYPO 22GX1.5 SAFETY (NEEDLE) ×3 IMPLANT
NEEDLE SPNL 18GX3.5 QUINCKE PK (NEEDLE) ×3 IMPLANT
NS IRRIG 1000ML POUR BTL (IV SOLUTION) ×5 IMPLANT
PACK LAMINECTOMY NEURO (CUSTOM PROCEDURE TRAY) ×3 IMPLANT
PAD ARMBOARD 7.5X6 YLW CONV (MISCELLANEOUS) ×11 IMPLANT
PATTIES SURGICAL .25X.25 (GAUZE/BANDAGES/DRESSINGS) IMPLANT
PIN MAYFIELD SKULL DISP (PIN) ×3 IMPLANT
PLASMABLADE 3.0S (MISCELLANEOUS) ×3
ROD VUEPOINT 3.5X60 (Rod) ×2 IMPLANT
SCREW MA MM 3.5X14 (Screw) ×2 IMPLANT
SCREW SET THREADED (Screw) ×8 IMPLANT
SCREW VUEPOINT 3.5X16MM (Screw) ×6 IMPLANT
SPONGE LAP 4X18 RFD (DISPOSABLE) IMPLANT
STAPLER SKIN PROX WIDE 3.9 (STAPLE) ×3 IMPLANT
STRIP CLOSURE SKIN 1/2X4 (GAUZE/BANDAGES/DRESSINGS) IMPLANT
SUT ETHILON 3 0 FSL (SUTURE) IMPLANT
SUT VIC AB 0 CT1 18XCR BRD8 (SUTURE) ×1 IMPLANT
SUT VIC AB 0 CT1 8-18 (SUTURE) ×3
SUT VIC AB 2-0 CP2 18 (SUTURE) ×3 IMPLANT
SUT VIC AB 3-0 SH 8-18 (SUTURE) ×2 IMPLANT
SUT VIC AB 4-0 RB1 18 (SUTURE) ×3 IMPLANT
TOWEL GREEN STERILE (TOWEL DISPOSABLE) ×3 IMPLANT
TOWEL GREEN STERILE FF (TOWEL DISPOSABLE) ×3 IMPLANT
TRAY FOLEY MTR SLVR 16FR STAT (SET/KITS/TRAYS/PACK) IMPLANT
WATER STERILE IRR 1000ML POUR (IV SOLUTION) ×3 IMPLANT

## 2020-07-26 NOTE — Progress Notes (Signed)
Patient is discharged from room 3C07 at this time. Alert and in stable condition. IV site d/c'd and instructions read to patient and spouse with understanding verbalized and all questions answered. Left unit via wheelchair with all belongings at side. 

## 2020-07-26 NOTE — Progress Notes (Signed)
Orthopedic Tech Progress Note Patient Details:  Seth Schwartz 1936/07/26 [id number]  Ortho Devices Type of Ortho Device: Soft collar Ortho Device/Splint Interventions: Application, Ordered   Post Interventions Patient Tolerated: Well   Jabar Krysiak A Harinder Romas 07/26/2020, 11:17 AM

## 2020-07-26 NOTE — Evaluation (Signed)
Physical Therapy Evaluation and D/C Patient Details Name: Seth Schwartz MRN: [id number] DOB: April 24, 1936 Today's Date: 07/26/2020   History of Present Illness  Seth Schwartz is an 84 year old individual who has had significant neck shoulder and suboccipital pain particularly worse on the left side and on the right side.  Patient has a previous fusion from C3-C7.  He has advanced spondylitic degeneration in the facet joints at C2-C3 and having failed efforts at conservative management was advised regarding surgical stabilization of the facet joints. Underwent posterior decompression and fusion C2-3.   Clinical Impression  Pt admitted with above diagnosis. Pt was able to ambulate with RW with supervision on unit. Able to complete steps as well and educated regarding cervical precautions. Pt and wife feel comfortable going home and do not need any further therapy. Pt has RW he can use at home prn. No further skilled PT needed at this time.  Will sign off.   Follow Up Recommendations No PT follow up;Supervision - Intermittent    Equipment Recommendations  None recommended by PT    Recommendations for Other Services       Precautions / Restrictions Precautions Precautions: Cervical Precaution Booklet Issued: Yes (comment) Required Braces or Orthoses: Cervical Brace Cervical Brace: Soft collar;At all times (for 4 weeks per pt) Restrictions Weight Bearing Restrictions: No      Mobility  Bed Mobility Overal bed mobility: Independent             General bed mobility comments: incr time and cues for sequencing  Transfers Overall transfer level: Needs assistance Equipment used: Rolling walker (2 wheeled) Transfers: Sit to/from Stand Sit to Stand: Min guard         General transfer comment: Pt able to come to stand with cues for hand placement. Did need steadying in bathroom as he lost balance in tight space of bathroom.    Ambulation/Gait Ambulation/Gait assistance:  Supervision;Min guard Gait Distance (Feet): 450 Feet Assistive device: Rolling walker (2 wheeled) Gait Pattern/deviations: Step-through pattern;Decreased stride length   Gait velocity interpretation: 1.31 - 2.62 ft/sec, indicative of limited community ambulator General Gait Details: Pt able to ambulate in hallway without physical assist.  Pt was using the RW for balance.    Stairs Stairs: Yes Stairs assistance: Supervision;Min guard Stair Management: One rail Left;Alternating pattern;Forwards Number of Stairs: 9 General stair comments: IV limited pt from performing a flight but we did 3 steps x 3.  Pt did well and did not need assist by PT.    Wheelchair Mobility    Modified Rankin (Stroke Patients Only)       Balance Overall balance assessment: Needs assistance         Standing balance support: No upper extremity supported;During functional activity;Bilateral upper extremity supported Standing balance-Leahy Scale: Fair Standing balance comment: can stand statically without UE support.  Did use RW dynamically for extra support.                              Pertinent Vitals/Pain Pain Assessment: Faces Faces Pain Scale: Hurts whole lot Pain Location: neck Pain Descriptors / Indicators: Aching;Discomfort;Grimacing;Guarding Pain Intervention(s): Limited activity within patient's tolerance;Monitored during session;Repositioned;Patient requesting pain meds-RN notified    Home Living Family/patient expects to be discharged to:: Private residence Living Arrangements: Spouse/significant other Available Help at Discharge: Family;Available 24 hours/day Type of Home: House Home Access: Stairs to enter Entrance Stairs-Rails: None Entrance Stairs-Number of Steps: 2 Home Layout: Two level;Bed/bath  upstairs Home Equipment: Walker - 2 wheels;Bedside commode;Shower seat      Prior Function Level of Independence: Independent               Hand Dominance    Dominant Hand: Right    Extremity/Trunk Assessment   Upper Extremity Assessment Upper Extremity Assessment: Defer to OT evaluation    Lower Extremity Assessment Lower Extremity Assessment: Generalized weakness    Cervical / Trunk Assessment Cervical / Trunk Assessment: Normal  Communication   Communication: No difficulties  Cognition Arousal/Alertness: Awake/alert Behavior During Therapy: WFL for tasks assessed/performed Overall Cognitive Status: Within Functional Limits for tasks assessed                                        General Comments      Exercises     Assessment/Plan    PT Assessment Patent does not need any further PT services  PT Problem List         PT Treatment Interventions      PT Goals (Current goals can be found in the Care Plan section)  Acute Rehab PT Goals Patient Stated Goal: to go home PT Goal Formulation: All assessment and education complete, DC therapy    Frequency     Barriers to discharge        Co-evaluation               AM-PAC PT "6 Clicks" Mobility  Outcome Measure Help needed turning from your back to your side while in a flat bed without using bedrails?: None Help needed moving from lying on your back to sitting on the side of a flat bed without using bedrails?: None Help needed moving to and from a bed to a chair (including a wheelchair)?: None Help needed standing up from a chair using your arms (e.g., wheelchair or bedside chair)?: None Help needed to walk in hospital room?: None Help needed climbing 3-5 steps with a railing? : None 6 Click Score: 24    End of Session Equipment Utilized During Treatment: Gait belt;Cervical collar Activity Tolerance: Patient limited by fatigue Patient left: in chair;with call bell/phone within reach;with chair alarm set;with family/visitor present Nurse Communication: Mobility status PT Visit Diagnosis: Muscle weakness (generalized) (M62.81)    Time:  2620-3559 PT Time Calculation (min) (ACUTE ONLY): 24 min   Charges:   PT Evaluation $PT Eval Low Complexity: 1 Low PT Treatments $Gait Training: 8-22 mins        Chrishon Martino W,PT Acute Rehabilitation Services Pager:  [phone]  Office:  [phone]    Denice Paradise 07/26/2020, 3:31 PM

## 2020-07-26 NOTE — Anesthesia Procedure Notes (Signed)
Procedure Name: Intubation Date/Time: 07/26/2020 7:53 AM Performed by: Claris Che, CRNA Pre-anesthesia Checklist: Patient identified, Emergency Drugs available, Suction available, Patient being monitored and Timeout performed Patient Re-evaluated:Patient Re-evaluated prior to induction Oxygen Delivery Method: Circle system utilized Preoxygenation: Pre-oxygenation with 100% oxygen Induction Type: IV induction and Cricoid Pressure applied Ventilation: Mask ventilation without difficulty Laryngoscope Size: Mac and 4 Grade View: Grade II Tube size: 8.0 mm Number of attempts: 1 Airway Equipment and Method: Stylet Placement Confirmation: ETT inserted through vocal cords under direct vision,  positive ETCO2 and breath sounds checked- equal and bilateral Secured at: 24 cm Dental Injury: Teeth and Oropharynx as per pre-operative assessment

## 2020-07-26 NOTE — Discharge Summary (Signed)
Physician Discharge Summary  Patient ID: Seth Schwartz MRN: [id number] DOB/AGE: 02-12-1936 84 y.o.  Admit date: 07/26/2020 Discharge date: 07/26/2020  Admission Diagnoses: Spondylosis with radiculopathy C2-C3, advanced facet arthropathy  Discharge Diagnoses:  Active Problems:   Cervical arthritis with myelopathy Spondylosis with radiculopathy C2-C3, advanced facet arthropathy  Discharged Condition:stable  Hospital Course: the patient was admitted on 07/26/2020 and taken to the operating room for posterior cervical decompression of the facet joints with stabilization using the lateral mass screws C2-3. The patient tolerated the procedure well. The patient taken to the recovery room and then to the floor in stable condition. The hospital course was routine. There was no complications. The wound remains clean, dry and intact. The patient remained afebrile with stable vital signs, and tolerated a regular diet. The pain is well controlled with oral pain medications. Pt is discharging home with Norco 10/325 and valium 5mg .  Per nursing, pt has been ambulating the hallway without any difficulty. Pt is moving all extremities and eager to go home.   Consults: none  Discharge Exam: Blood pressure 121/71, pulse 81, temperature 97.8 F (36.6 C), temperature source Oral, resp. rate 18, height 5\' 10"  (1.778 m), weight 93.8 kg, SpO2 94 %.  Disposition: Discharge disposition: 01-Home or Self Care       Discharge Instructions    Call MD for:  difficulty breathing, headache or visual disturbances   Complete by: As directed    Call MD for:  extreme fatigue   Complete by: As directed    Call MD for:  hives   Complete by: As directed    Call MD for:  persistant dizziness or light-headedness   Complete by: As directed    Call MD for:  redness, tenderness, or signs of infection (pain, swelling, redness, odor or green/yellow discharge around incision site)   Complete by: As directed    Call MD  for:  severe uncontrolled pain   Complete by: As directed    Call MD for:  temperature >100.4   Complete by: As directed    Diet - low sodium heart healthy   Complete by: As directed    Incentive spirometry RT   Complete by: As directed    Increase activity slowly   Complete by: As directed    No dressing needed   Complete by: As directed      Allergies as of 07/26/2020      Reactions   Sulfa Antibiotics Shortness Of Breath   Ace Inhibitors Cough   Penicillins Hives   Has patient had a PCN reaction causing immediate rash, facial/tongue/throat swelling, SOB or lightheadedness with hypotension: No Has patient had a PCN reaction causing severe rash involving mucus membranes or skin necrosis: Yes Has patient had a PCN reaction that required hospitalization: No Has patient had a PCN reaction occurring within the last 10 years: No If all of the above answers are "NO", then may proceed with Cephalosporin use.      Medication List    STOP taking these medications   FISH OIL EXTRA STRENGTH PO     TAKE these medications   budesonide-formoterol 160-4.5 MCG/ACT inhaler Commonly known as: SYMBICORT Inhale 2 puffs into the lungs daily as needed (wheezing or shortness of breath).   cetirizine 10 MG tablet Commonly known as: ZYRTEC Take 10 mg by mouth daily.   Cholecalciferol 50 MCG (2000 UT) Caps Take 2,000 Units by mouth daily.   CHROMIUM-CINNAMON PO Take 2 capsules by mouth daily.  cyanocobalamin 2000 MCG tablet Take 2,000 mcg by mouth daily.   diazepam 5 MG tablet Commonly known as: Valium Take 1 tablet (5 mg total) by mouth every 6 (six) hours as needed for muscle spasms (PRN for muscle spasms). What changed: reasons to take this   EQL CoQ10 300 MG Caps Generic drug: Coenzyme Q10 Take 300 mg by mouth at bedtime.   fluvoxaMINE 50 MG tablet Commonly known as: LUVOX Take 50 mg by mouth 2 (two) times daily.   gabapentin 300 MG capsule Commonly known as:  NEURONTIN Take 300-600 mg by mouth See admin instructions. Take 600 mg by mouth in the morning, 300 mg with lunch, 300 mg with dinner and 300 mg at bedtime   HYDROcodone-acetaminophen 10-325 MG tablet Commonly known as: Norco Take 1 tablet by mouth every 4 (four) hours as needed for up to 5 days for severe pain. What changed:   how much to take  when to take this  reasons to take this  Another medication with the same name was removed. Continue taking this medication, and follow the directions you see here.   hydroxychloroquine 200 MG tablet Commonly known as: PLAQUENIL Take 200 mg by mouth 2 (two) times daily.   irbesartan-hydrochlorothiazide 150-12.5 MG tablet Commonly known as: AVALIDE Take 1 tablet by mouth daily.   leflunomide 20 MG tablet Commonly known as: ARAVA Take 20 mg by mouth daily.   metFORMIN 500 MG tablet Commonly known as: GLUCOPHAGE Take 500 mg by mouth 2 (two) times daily with a meal.   multivitamin with minerals Tabs tablet Take 1 tablet by mouth daily.   omeprazole 20 MG capsule Commonly known as: PRILOSEC Take 20 mg by mouth at bedtime.   OSTEO BI-FLEX REGULAR STRENGTH PO Take 1 tablet by mouth in the morning and at bedtime.   PAPAYA ENZYMES PO Take 2 tablets by mouth as needed (indigestion).   polyethylene glycol 17 g packet Commonly known as: MIRALAX / GLYCOLAX Take 17 g by mouth daily as needed for moderate constipation.   ranitidine 150 MG tablet Commonly known as: ZANTAC Take 150 mg by mouth at bedtime.   rosuvastatin 5 MG tablet Commonly known as: CRESTOR Take 5 mg by mouth 3 (three) times a week.   SUPER B COMPLEX/C PO Take 1 tablet by mouth daily.   tamsulosin 0.4 MG Caps capsule Commonly known as: FLOMAX Take 0.4 mg by mouth daily after supper.   Testosterone Cypionate 100 MG/ML Soln Inject 100 mg as directed See admin instructions. Inject 100 mg every 10 days   timolol 0.5 % ophthalmic solution Commonly known as:  BETIMOL Place 1 drop into both eyes at bedtime.   TURMERIC PO Take by mouth.            Discharge Care Instructions  (From admission, onward)         Start     Ordered   07/26/20 0000  No dressing needed        07/26/20 1845           Signed: Osie Cheeks 07/26/2020, 6:49 PM

## 2020-07-26 NOTE — Anesthesia Postprocedure Evaluation (Signed)
Anesthesia Post Note  Patient: Seth Schwartz  Procedure(s) Performed: Cervical Two-Three Posterior cervical fusion with lateral mass fixation (N/A Spine Cervical)     Patient location during evaluation: PACU Anesthesia Type: General Level of consciousness: awake and alert Pain management: pain level controlled Vital Signs Assessment: post-procedure vital signs reviewed and stable Respiratory status: spontaneous breathing, nonlabored ventilation, respiratory function stable and patient connected to nasal cannula oxygen Cardiovascular status: blood pressure returned to baseline and stable Postop Assessment: no apparent nausea or vomiting Anesthetic complications: no   No complications documented.  Last Vitals:  Vitals:   07/26/20 1135 07/26/20 1604  BP: 136/88 121/71  Pulse: 83 81  Resp: 20 18  Temp: 36.8 C 36.6 C  SpO2: 95% 94%    Last Pain:  Vitals:   07/26/20 1604  TempSrc: Oral  PainSc:                  [name]

## 2020-07-26 NOTE — H&P (Signed)
Seth Schwartz is an 84 y.o. male.   Chief Complaint: Suboccipital neck pain HPI: Seth Schwartz is an 84 year old individual whose had extensive spondylitic disease throughout his lumbar spine and his cervical spine.  [name] multilevel decompression and fusion from C3 down to C7 in the past over a number of separate procedures.  He had done well for the last couple of years but has developed severe and unrelenting suboccipital pain with radiation of the left side.  MRI demonstrates that the patient is involved in substantial spondylitic stenosis particularly on the left side at the C2-C3 level with severe facet arthropathy.  After failing efforts at conservative management over the past number of months we advise decompression and fusion of the C2-C3 level via posterior approach.  He is now admitted for that procedure.  Past Medical History:  Diagnosis Date  . Allergic rhinitis   . Anxiety    takes Valium prn  . Arthritis    spinal DDD, stenosis  . Asthma   . Chronic low back pain   . Complication of anesthesia    [name] AFTER BEING GIVEN " TWILIGHT SLEEP"   . Constipation    takes Benefiber and stool softener daily  . Depression    takes Luvox daily  . Diabetes ([place])    TYPE 2   . Dizziness    LAST SPELL WAS 45 DAYS AGO; REPORTS LASTING 5 MINUTES AND BACK TO HIS NORMAL RIGHT AFTER ; DENIES BLACKOUTS; HAD A CT OF HEAD THAT ONLY  SHOWED A SMALL NODULE THAT IS TO BE MONITORED NOW    . ED (erectile dysfunction)    Dr. [name]  . Enlarged prostate    takes Uroxatral nightly  . GERD (gastroesophageal reflux disease)    TAKES RANITIDINE AND PAPAYA  TO TREAT   . Glaucoma    uses eye drops daily  . H/O hiatal hernia   . History of cataract   . History of colon polyps   . History of gastric ulcer 1967  . History of kidney stones    lithotripsyx1  . History of rectal bleeding   . History of stress test 2010   told wnl, on treadmill  . Hypercalcemia   .  Hyperlipidemia    takes Cholestoff at night and Fish Oil bid  . Hyperparathyroidism ([place])   . Hypertension    takes Losartan - takes in a.m. , not referred to cardiac, had stress test 6-7 yrs. ago, told wnl  . Insomnia    takes Trazodone nightly  . Low testosterone    Dr. [name]  . Migraine    r/t cervical issues, occasionally has severe headache & takes NSAID & Benadryl  . Multiple nevi    Dr. Radford Pax  . Neuropathy   . Nocturia   . OA (osteoarthritis)   . Peyronie disease    Dr. [name]  . Pneumonia    as a baby  . Shortness of breath dyspnea    due to pain & being out of shape  . Skin spots-aging   . Urinary frequency   . Urinary urgency   . Weakness    left leg and both hands    Past Surgical History:  Procedure Laterality Date  . ANTERIOR CERVICAL DECOMP/DISCECTOMY FUSION N/A 12/17/2012   Procedure: ANTERIOR CERVICAL DECOMPRESSION/DISCECTOMY FUSION 2 LEVELS;  Surgeon: [name], MD;  Location: [facility];  Service: Neurosurgery;  Laterality: N/A;  Cervical three-four Anterior cervical decompression/diskectomy/fusion, with anterior revision of  Cervical six-seven pseudoarthrosis  . ANTERIOR CERVICAL DECOMP/DISCECTOMY FUSION  1998  . ANTERIOR CERVICAL DECOMP/DISCECTOMY FUSION N/A 06/02/2014   Procedure: Cervical three-four, Cervical six-seven  Anterior Cervical Decompression/diskectomy/fusion with Iliac crest bone graft;  Surgeon: [name], MD;  Location: [facility];  Service: Neurosurgery;  Laterality: N/A;  . ANTERIOR LUMBAR FUSION N/A 04/01/2013   Procedure: ANTERIOR LUMBAR FUSION 1 LEVEL;  Surgeon: [name], MD;  Location: [facility];  Service: Neurosurgery;  Laterality: N/A;  Lumbar five-Sacral One Anterior lumbar interbody fusion  . BACK SURGERY  2000/2002/2007   3 lumbars, 1 cervical  with fusion  . COLONOSCOPY    . ESOPHAGOGASTRODUODENOSCOPY  4/15   esophagus stretched  . EYE SURGERY Bilateral 2008   bilateral cataracts removed, 02/2015 & 6/ 2016,   glaucoma has improved   . HEMORRHOID SURGERY  2009   x 2  . LITHOTRIPSY    . PARATHYROID EXPLORATION N/A 02/15/2018   Procedure: NECK EXPLORATION WITH RIGHT INFERIOR PARATHYROIDECTOMY AND BIOPSY OF LEFT INFERIOR PARATHYROID;  Surgeon: [name], MD;  Location: [facility];  Service: General;  Laterality: N/A;  . TRIGGER FINGER RELEASE Left 4-30yrs ago   middle finger  . TRIGGER FINGER RELEASE Left     History reviewed. No pertinent family history. Social History:  reports that he quit smoking about 41 years ago. His smoking use included cigarettes. He has a 6.25 pack-year smoking history. He quit smokeless tobacco use about 41 years ago.  His smokeless tobacco use included chew and snuff. He reports that he does not drink alcohol and does not use drugs.  Allergies:  Allergies  Allergen Reactions  . Sulfa Antibiotics Shortness Of Breath  . Ace Inhibitors Cough  . Penicillins Hives    Has patient had a PCN reaction causing immediate rash, facial/tongue/throat swelling, SOB or lightheadedness with hypotension: No Has patient had a PCN reaction causing severe rash involving mucus membranes or skin necrosis: Yes Has patient had a PCN reaction that required hospitalization: No Has patient had a PCN reaction occurring within the last 10 years: No If all of the above answers are "NO", then may proceed with Cephalosporin use.     Medications Prior to Admission  Medication Sig Dispense Refill  . budesonide-formoterol (SYMBICORT) 160-4.5 MCG/ACT inhaler Inhale 2 puffs into the lungs daily as needed (wheezing or shortness of breath).     . cetirizine (ZYRTEC) 10 MG tablet Take 10 mg by mouth daily.    . Coenzyme Q10 (EQL COQ10) 300 MG CAPS Take 300 mg by mouth at bedtime.     . cyanocobalamin 2000 MCG tablet Take 2,000 mcg by mouth daily.    . diazepam (VALIUM) 5 MG tablet Take 1 tablet (5 mg total) by mouth every 6 (six) hours as needed for muscle spasms. (Patient taking differently: Take 2.5 mg  by mouth at bedtime. ) 60 tablet 0  . fluvoxaMINE (LUVOX) 50 MG tablet Take 50 mg by mouth 2 (two) times daily.    Marland Kitchen gabapentin (NEURONTIN) 300 MG capsule Take 300-600 mg by mouth See admin instructions. Take 600 mg by mouth in the morning, 300 mg with lunch, 300 mg with dinner and 300 mg at bedtime    . Glucosamine-Chondroitin (OSTEO BI-FLEX REGULAR STRENGTH PO) Take 1 tablet by mouth in the morning and at bedtime.    Marland Kitchen HYDROcodone-acetaminophen (NORCO) 10-325 MG tablet Take 0.5 tablets by mouth 4 (four) times daily.     . irbesartan-hydrochlorothiazide (AVALIDE) 150-12.5 MG tablet Take 1 tablet  by mouth daily.    . metFORMIN (GLUCOPHAGE) 500 MG tablet Take 500 mg by mouth 2 (two) times daily with a meal.     . Multiple Vitamin (MULTIVITAMIN WITH MINERALS) TABS tablet Take 1 tablet by mouth daily.    . Omega-3 Fatty Acids (FISH OIL EXTRA STRENGTH PO) Take 1,500 mg by mouth daily.     Marland Kitchen omeprazole (PRILOSEC) 20 MG capsule Take 20 mg by mouth at bedtime.    . polyethylene glycol (MIRALAX / GLYCOLAX) packet Take 17 g by mouth daily as needed for moderate constipation.     . rosuvastatin (CRESTOR) 5 MG tablet Take 5 mg by mouth 3 (three) times a week.     . SUPER B COMPLEX/C PO Take 1 tablet by mouth daily.    . tamsulosin (FLOMAX) 0.4 MG CAPS capsule Take 0.4 mg by mouth daily after supper.    . Testosterone Cypionate 100 MG/ML SOLN Inject 100 mg as directed See admin instructions. Inject 100 mg every 10 days    . timolol (BETIMOL) 0.5 % ophthalmic solution Place 1 drop into both eyes at bedtime.     . Cholecalciferol 2000 units CAPS Take 2,000 Units by mouth daily.  (Patient not taking: Reported on 07/14/2020)    . CHROMIUM-CINNAMON PO Take 2 capsules by mouth daily. (Patient not taking: Reported on 07/14/2020)    . HYDROcodone-acetaminophen (NORCO/VICODIN) 5-325 MG tablet Take 1-2 tablets by mouth every 4 (four) hours as needed for moderate pain. (Patient not taking: Reported on 07/14/2020) 20 tablet  0  . hydroxychloroquine (PLAQUENIL) 200 MG tablet Take 200 mg by mouth 2 (two) times daily. (Patient not taking: Reported on 07/14/2020)    . leflunomide (ARAVA) 20 MG tablet Take 20 mg by mouth daily. (Patient not taking: Reported on 07/14/2020)    . PAPAYA ENZYMES PO Take 2 tablets by mouth as needed (indigestion). (Patient not taking: Reported on 07/14/2020)    . ranitidine (ZANTAC) 150 MG tablet Take 150 mg by mouth at bedtime. (Patient not taking: Reported on 07/14/2020)    . TURMERIC PO Take by mouth. (Patient not taking: Reported on 07/14/2020)      Results for orders placed or performed during the hospital encounter of 07/26/20 (from the past 48 hour(s))  Glucose, capillary     Status: Abnormal   Collection Time: 07/26/20  5:54 AM  Result Value Ref Range   Glucose-Capillary 151 (H) 70 - 99 mg/dL    Comment: Glucose reference range applies only to samples taken after fasting for at least 8 hours.   No results found.  Review of Systems  Constitutional: Positive for activity change.  HENT: Negative.   Eyes: Negative.   Respiratory: Negative.   Cardiovascular: Negative.   Gastrointestinal: Negative.   Musculoskeletal: Positive for myalgias, neck pain and neck stiffness.  Neurological: Positive for tremors, seizures, weakness and headaches.  Hematological: Negative.   Psychiatric/Behavioral: The patient is nervous/anxious.     Blood pressure (!) 148/65, pulse 71, temperature 98.2 F (36.8 C), temperature source Oral, resp. rate 18, height 5\' 10"  (1.778 m), weight 93.8 kg, SpO2 95 %. Physical Exam Constitutional:      Appearance: Normal appearance.  HENT:     Head: Atraumatic.     Right Ear: Tympanic membrane normal.     Left Ear: Tympanic membrane normal.     Nose: Nose normal.     Mouth/Throat:     Mouth: Mucous membranes are moist.  Eyes:     Extraocular Movements:  Extraocular movements intact.     Pupils: Pupils are equal, round, and reactive to light.  Neck:      Comments: Markedly decreased range of motion turning 30 degrees left and right flexing and extending less than 30% of normal Cardiovascular:     Rate and Rhythm: Normal rate and regular rhythm.     Pulses: Normal pulses.     Heart sounds: Normal heart sounds.  Pulmonary:     Effort: Pulmonary effort is normal.     Breath sounds: Normal breath sounds.  Abdominal:     General: Abdomen is flat. Bowel sounds are normal.     Palpations: Abdomen is soft.  Musculoskeletal:     Comments: Limited mobility in his back patient walks with a 10 degree forward stoop motor function reveals good strength in iliopsoas quad tibialis anterior and gastrocs tone and bulk are intact.  Upper extremity strength and reflexes are intact save for absent reflexes in the bicep and tricep.  Skin:    General: Skin is warm and dry.     Capillary Refill: Capillary refill takes less than 2 seconds.  Neurological:     General: No focal deficit present.     Mental Status: He is alert and oriented to person, place, and time.  Psychiatric:        Mood and Affect: Mood normal.        Behavior: Behavior normal.        Thought Content: Thought content normal.        Judgment: Judgment normal.      Assessment/Plan Spondylosis and stenosis C2-C3 status post arthrodesis C3-C6.  Cervical radiculopathy.  Plan: Decompression fusion C2-C3 using posterior construct with allograft arthrodesis.  Earleen Newport, MD 07/26/2020, 7:38 AM

## 2020-07-26 NOTE — Anesthesia Preprocedure Evaluation (Addendum)
Anesthesia Evaluation  Patient identified by MRN, date of birth, ID band Patient awake  General Assessment Comment:"awareness" under lithotripsy twilight sedation  Reviewed: Patient's Chart, lab work & pertinent test results  History of Anesthesia Complications (+) history of anesthetic complications  Airway Mallampati: II  TM Distance: >3 FB Neck ROM: Full    Dental  (+) Upper Dentures   Pulmonary shortness of breath and with exertion, former smoker,  Allergic rhinitis   Pulmonary exam normal        Cardiovascular hypertension, Pt. on medications  Rhythm:Regular Rate:Abnormal     Neuro/Psych  Headaches, Anxiety Depression    GI/Hepatic Neg liver ROS, hiatal hernia, GERD  Medicated,  Endo/Other  diabetes, Well Controlled, Type 2  Renal/GU negative Renal ROS   BPH, urinary urgencey    Musculoskeletal  (+) Arthritis , Osteoarthritis,    Abdominal (+)  Abdomen: soft. Bowel sounds: normal.  Peds  Hematology negative hematology ROS (+)   Anesthesia Other Findings   Reproductive/Obstetrics                            Anesthesia Physical Anesthesia Plan  ASA: II  Anesthesia Plan: General   Post-op Pain Management:    Induction: Intravenous  PONV Risk Score and Plan: 2 and Ondansetron, Dexamethasone and Treatment may vary due to age or medical condition  Airway Management Planned: Mask and Oral ETT  Additional Equipment: None  Intra-op Plan:   Post-operative Plan: Extubation in OR  Informed Consent: I have reviewed the patients History and Physical, chart, labs and discussed the procedure including the risks, benefits and alternatives for the proposed anesthesia with the patient or authorized representative who has indicated his/her understanding and acceptance.     Dental advisory given  Plan Discussed with: CRNA and Surgeon  Anesthesia Plan Comments: (Lab Results       Component                Value               Date                      WBC                      6.9                 07/22/2020                HGB                      16.2                07/22/2020                HCT                      50.1                07/22/2020                MCV                      88.8                07/22/2020                PLT  186                 07/22/2020           )       Anesthesia Quick Evaluation

## 2020-07-26 NOTE — Op Note (Signed)
Date of surgery: 07/26/2020 Preoperative diagnosis: Spondylosis with radiculopathy C2-C3, advanced facet arthropathy. Postoperative diagnosis: Same Procedure: Posterior cervical decompression of the facet joints with stabilization using the lateral mass facet screws C2-C3.  Arthrodesis with allograft C2-C3.  Fluoroscopic visualization. Surgeon: [name] First Assistant: Doreatha Martin, NP Anesthesia: General endotracheal Indications: Seth Schwartz is an 84 year old individual who has had significant neck shoulder and suboccipital pain particularly worse on the left side and on the right side.  Patient has a previous fusion from C3-C7.  He has advanced spondylitic degeneration in the facet joints at C2-C3 and having failed efforts at conservative management was advised regarding surgical stabilization of the facet joints.  Procedure: The patient was brought to the operating room supine on the stretcher.  After the smooth induction of general endotracheal anesthesia and placement of appropriate monitoring lines he was carefully turned prone after being placed in the 3 pin headrest.  The neck was secured in a neutral position and the back of the neck was then prepped with alcohol DuraPrep and draped in a sterile fashion after shaving hair appropriately.  A midline incision was created in the cervical spine carried down to the cervical dorsal fascia.  Fascia was opened on either side of midline to expose the spinous process of C2.  The dissection was then carried inferiorly to expose the facet joints at C2-C3.  At this point fluoroscopy was used to verify the positioning and the level and when this was confirmed then complete facetectomy was performed on the left side removing a significant redundant bony overgrowth from the surface of the facet joint and hypertrophied synovium within the facet joint.  The lateral gutter was then cleared and the path of the common dural tube was cleared.  The takeoff of the C3  nerve root was identified and decompressed on the left side.  On the right side no/compression existed but the facet joint itself was open.  Then lateral mass entry sites were chosen in the midportion of the facet joint angled 15 degrees lateral and superiorly.  A guide drill was used to start a hole and then a hand drill was used to drill a 14 mm hole in C3 on the left side.  A 16 mm hole could be drilled at C2 on the left side and the other holes on the right side were each 16 mm.  Then a 14 mm screw was placed on the left side at C3 and 16 mm screws were placed on the C2 vertebrae bilaterally and on the right side at C3.  The cortical surfaces in the interlaminar space and over the facets at C2-C3 were then decorticated using a high-speed drill once this was accomplished on aliquot of 5 cc of osteocell was placed into these lateral gutters.  1/5 60 mm rod was cut in half and each half was used to secure C2-3 on either side.  This was done in a neutral construct final radiograph was obtained in the AP and lateral projection which demonstrated good positioning of the hardware.  With this hemostasis in the soft tissue was obtained.  Cervical dorsal fascia was then closed with #1 Vicryl 2-0 Vicryl was used in the subcutaneous tissues and 3-0 and 4-0 Vicryl in a subcuticular region.  Blood loss was estimated less than 75 cc.  Patient tolerated procedure well he was removed from the 3 pin headrest turned supine and returned to the recovery room in stable condition.

## 2020-07-26 NOTE — Discharge Instructions (Signed)
Slowly increase your activity back to normal. No bending, no heavy lifting more than 10 pounds. Ambulate daily. Walk straight, sit straight and stand straight. No driving for a week.  Resume your diet Your incision is closed with dermabond (purple glue). This will naturally fall off over the next 1-2 weeks.  Okay to shower for today. Use regular soap and water and try to be gentle when cleaning your incision. Pat the incision dry after shower. Follow up with Dr. Ellene Route in 2 weeks after discharge. If you do not already have a discharge appointment, please call his office at [phone] to schedule a follow up appointment. If you have any concerns or questions, please call the office and let us know.  Restart your fish oils 5-7 days. Dont take NSAIDS for a week.

## 2020-07-26 NOTE — Transfer of Care (Signed)
Immediate Anesthesia Transfer of Care Note  Patient: Seth Schwartz  Procedure(s) Performed: Cervical Two-Three Posterior cervical fusion with lateral mass fixation (N/A Spine Cervical)  Patient Location: PACU  Anesthesia Type:General  Level of Consciousness: oriented, sedated, drowsy, patient cooperative and responds to stimulation  Airway & Oxygen Therapy: Patient Spontanous Breathing and Patient connected to nasal cannula oxygen  Post-op Assessment: Report given to RN, Post -op Vital signs reviewed and stable and Patient moving all extremities X 4  Post vital signs: Reviewed and stable  Last Vitals:  Vitals Value Taken Time  BP 136/87 07/26/20 1012  Temp    Pulse 90 07/26/20 1016  Resp 13 07/26/20 1016  SpO2 97 % 07/26/20 1016  Vitals shown include unvalidated device data.  Last Pain:  Vitals:   07/26/20 0645  TempSrc:   PainSc: 4          Complications: No complications documented.

## 2020-07-28 ENCOUNTER — Encounter (HOSPITAL_COMMUNITY): Payer: Self-pay | Admitting: Neurological Surgery

## 2020-08-11 DIAGNOSIS — M47812 Spondylosis without myelopathy or radiculopathy, cervical region: Secondary | ICD-10-CM | POA: Diagnosis not present

## 2020-08-16 DIAGNOSIS — E782 Mixed hyperlipidemia: Secondary | ICD-10-CM | POA: Diagnosis not present

## 2020-08-16 DIAGNOSIS — J4521 Mild intermittent asthma with (acute) exacerbation: Secondary | ICD-10-CM | POA: Diagnosis not present

## 2020-08-16 DIAGNOSIS — M479 Spondylosis, unspecified: Secondary | ICD-10-CM | POA: Diagnosis not present

## 2020-08-16 DIAGNOSIS — E1142 Type 2 diabetes mellitus with diabetic polyneuropathy: Secondary | ICD-10-CM | POA: Diagnosis not present

## 2020-08-16 DIAGNOSIS — I10 Essential (primary) hypertension: Secondary | ICD-10-CM | POA: Diagnosis not present

## 2020-08-16 DIAGNOSIS — R531 Weakness: Secondary | ICD-10-CM | POA: Diagnosis not present

## 2020-08-16 DIAGNOSIS — N400 Enlarged prostate without lower urinary tract symptoms: Secondary | ICD-10-CM | POA: Diagnosis not present

## 2020-08-16 DIAGNOSIS — K219 Gastro-esophageal reflux disease without esophagitis: Secondary | ICD-10-CM | POA: Diagnosis not present

## 2020-08-16 DIAGNOSIS — M1990 Unspecified osteoarthritis, unspecified site: Secondary | ICD-10-CM | POA: Diagnosis not present

## 2020-08-16 DIAGNOSIS — G8918 Other acute postprocedural pain: Secondary | ICD-10-CM | POA: Diagnosis not present

## 2020-08-16 DIAGNOSIS — M4322 Fusion of spine, cervical region: Secondary | ICD-10-CM | POA: Diagnosis not present

## 2020-08-16 DIAGNOSIS — F339 Major depressive disorder, recurrent, unspecified: Secondary | ICD-10-CM | POA: Diagnosis not present

## 2020-08-30 DIAGNOSIS — G8918 Other acute postprocedural pain: Secondary | ICD-10-CM | POA: Diagnosis not present

## 2020-08-30 DIAGNOSIS — R062 Wheezing: Secondary | ICD-10-CM | POA: Diagnosis not present

## 2020-08-30 DIAGNOSIS — K219 Gastro-esophageal reflux disease without esophagitis: Secondary | ICD-10-CM | POA: Diagnosis not present

## 2020-08-30 DIAGNOSIS — E1142 Type 2 diabetes mellitus with diabetic polyneuropathy: Secondary | ICD-10-CM | POA: Diagnosis not present

## 2020-08-30 DIAGNOSIS — M479 Spondylosis, unspecified: Secondary | ICD-10-CM | POA: Diagnosis not present

## 2020-08-30 DIAGNOSIS — R531 Weakness: Secondary | ICD-10-CM | POA: Diagnosis not present

## 2020-08-30 DIAGNOSIS — R040 Epistaxis: Secondary | ICD-10-CM | POA: Diagnosis not present

## 2020-08-30 DIAGNOSIS — F339 Major depressive disorder, recurrent, unspecified: Secondary | ICD-10-CM | POA: Diagnosis not present

## 2020-08-30 DIAGNOSIS — E782 Mixed hyperlipidemia: Secondary | ICD-10-CM | POA: Diagnosis not present

## 2020-08-30 DIAGNOSIS — M4322 Fusion of spine, cervical region: Secondary | ICD-10-CM | POA: Diagnosis not present

## 2020-08-30 DIAGNOSIS — I10 Essential (primary) hypertension: Secondary | ICD-10-CM | POA: Diagnosis not present

## 2020-08-30 DIAGNOSIS — J4521 Mild intermittent asthma with (acute) exacerbation: Secondary | ICD-10-CM | POA: Diagnosis not present

## 2020-09-22 DIAGNOSIS — M47812 Spondylosis without myelopathy or radiculopathy, cervical region: Secondary | ICD-10-CM | POA: Diagnosis not present

## 2020-09-28 DIAGNOSIS — E291 Testicular hypofunction: Secondary | ICD-10-CM | POA: Diagnosis not present

## 2020-09-28 DIAGNOSIS — R948 Abnormal results of function studies of other organs and systems: Secondary | ICD-10-CM | POA: Diagnosis not present

## 2020-10-04 DIAGNOSIS — I10 Essential (primary) hypertension: Secondary | ICD-10-CM | POA: Diagnosis not present

## 2020-10-04 DIAGNOSIS — K219 Gastro-esophageal reflux disease without esophagitis: Secondary | ICD-10-CM | POA: Diagnosis not present

## 2020-10-04 DIAGNOSIS — Z7984 Long term (current) use of oral hypoglycemic drugs: Secondary | ICD-10-CM | POA: Diagnosis not present

## 2020-10-04 DIAGNOSIS — F339 Major depressive disorder, recurrent, unspecified: Secondary | ICD-10-CM | POA: Diagnosis not present

## 2020-10-04 DIAGNOSIS — E782 Mixed hyperlipidemia: Secondary | ICD-10-CM | POA: Diagnosis not present

## 2020-10-04 DIAGNOSIS — G8918 Other acute postprocedural pain: Secondary | ICD-10-CM | POA: Diagnosis not present

## 2020-10-04 DIAGNOSIS — R296 Repeated falls: Secondary | ICD-10-CM | POA: Diagnosis not present

## 2020-10-04 DIAGNOSIS — R062 Wheezing: Secondary | ICD-10-CM | POA: Diagnosis not present

## 2020-10-04 DIAGNOSIS — E1142 Type 2 diabetes mellitus with diabetic polyneuropathy: Secondary | ICD-10-CM | POA: Diagnosis not present

## 2020-10-04 DIAGNOSIS — M4322 Fusion of spine, cervical region: Secondary | ICD-10-CM | POA: Diagnosis not present

## 2020-10-04 DIAGNOSIS — R531 Weakness: Secondary | ICD-10-CM | POA: Diagnosis not present

## 2020-10-04 DIAGNOSIS — J4521 Mild intermittent asthma with (acute) exacerbation: Secondary | ICD-10-CM | POA: Diagnosis not present

## 2020-10-05 DIAGNOSIS — E291 Testicular hypofunction: Secondary | ICD-10-CM | POA: Diagnosis not present

## 2020-10-05 DIAGNOSIS — N400 Enlarged prostate without lower urinary tract symptoms: Secondary | ICD-10-CM | POA: Diagnosis not present

## 2020-10-18 DIAGNOSIS — E1142 Type 2 diabetes mellitus with diabetic polyneuropathy: Secondary | ICD-10-CM | POA: Diagnosis not present

## 2020-10-18 DIAGNOSIS — I10 Essential (primary) hypertension: Secondary | ICD-10-CM | POA: Diagnosis not present

## 2020-10-18 DIAGNOSIS — G8929 Other chronic pain: Secondary | ICD-10-CM | POA: Diagnosis not present

## 2020-10-18 DIAGNOSIS — F339 Major depressive disorder, recurrent, unspecified: Secondary | ICD-10-CM | POA: Diagnosis not present

## 2020-10-18 DIAGNOSIS — J4521 Mild intermittent asthma with (acute) exacerbation: Secondary | ICD-10-CM | POA: Diagnosis not present

## 2020-10-18 DIAGNOSIS — E782 Mixed hyperlipidemia: Secondary | ICD-10-CM | POA: Diagnosis not present

## 2020-10-18 DIAGNOSIS — N400 Enlarged prostate without lower urinary tract symptoms: Secondary | ICD-10-CM | POA: Diagnosis not present

## 2020-10-18 DIAGNOSIS — K219 Gastro-esophageal reflux disease without esophagitis: Secondary | ICD-10-CM | POA: Diagnosis not present

## 2020-10-18 DIAGNOSIS — N1831 Chronic kidney disease, stage 3a: Secondary | ICD-10-CM | POA: Diagnosis not present

## 2020-10-18 DIAGNOSIS — M4720 Other spondylosis with radiculopathy, site unspecified: Secondary | ICD-10-CM | POA: Diagnosis not present

## 2020-10-18 DIAGNOSIS — H409 Unspecified glaucoma: Secondary | ICD-10-CM | POA: Diagnosis not present

## 2020-10-18 DIAGNOSIS — M069 Rheumatoid arthritis, unspecified: Secondary | ICD-10-CM | POA: Diagnosis not present

## 2020-10-28 DIAGNOSIS — L814 Other melanin hyperpigmentation: Secondary | ICD-10-CM | POA: Diagnosis not present

## 2020-10-28 DIAGNOSIS — Z85828 Personal history of other malignant neoplasm of skin: Secondary | ICD-10-CM | POA: Diagnosis not present

## 2020-10-28 DIAGNOSIS — L820 Inflamed seborrheic keratosis: Secondary | ICD-10-CM | POA: Diagnosis not present

## 2020-10-28 DIAGNOSIS — L821 Other seborrheic keratosis: Secondary | ICD-10-CM | POA: Diagnosis not present

## 2020-10-28 DIAGNOSIS — D1801 Hemangioma of skin and subcutaneous tissue: Secondary | ICD-10-CM | POA: Diagnosis not present

## 2020-10-28 DIAGNOSIS — D2371 Other benign neoplasm of skin of right lower limb, including hip: Secondary | ICD-10-CM | POA: Diagnosis not present

## 2020-10-28 DIAGNOSIS — L570 Actinic keratosis: Secondary | ICD-10-CM | POA: Diagnosis not present

## 2020-11-01 DIAGNOSIS — R7989 Other specified abnormal findings of blood chemistry: Secondary | ICD-10-CM | POA: Diagnosis not present

## 2020-11-04 DIAGNOSIS — R7989 Other specified abnormal findings of blood chemistry: Secondary | ICD-10-CM | POA: Diagnosis not present

## 2020-11-04 DIAGNOSIS — E782 Mixed hyperlipidemia: Secondary | ICD-10-CM | POA: Diagnosis not present

## 2020-11-04 DIAGNOSIS — N1831 Chronic kidney disease, stage 3a: Secondary | ICD-10-CM | POA: Diagnosis not present

## 2020-11-04 DIAGNOSIS — E1142 Type 2 diabetes mellitus with diabetic polyneuropathy: Secondary | ICD-10-CM | POA: Diagnosis not present

## 2020-11-04 DIAGNOSIS — I10 Essential (primary) hypertension: Secondary | ICD-10-CM | POA: Diagnosis not present

## 2020-11-04 DIAGNOSIS — Z7984 Long term (current) use of oral hypoglycemic drugs: Secondary | ICD-10-CM | POA: Diagnosis not present

## 2020-11-24 DIAGNOSIS — Z1211 Encounter for screening for malignant neoplasm of colon: Secondary | ICD-10-CM | POA: Diagnosis not present

## 2020-12-06 DIAGNOSIS — E782 Mixed hyperlipidemia: Secondary | ICD-10-CM | POA: Diagnosis not present

## 2020-12-06 DIAGNOSIS — F339 Major depressive disorder, recurrent, unspecified: Secondary | ICD-10-CM | POA: Diagnosis not present

## 2020-12-06 DIAGNOSIS — G8929 Other chronic pain: Secondary | ICD-10-CM | POA: Diagnosis not present

## 2020-12-06 DIAGNOSIS — I10 Essential (primary) hypertension: Secondary | ICD-10-CM | POA: Diagnosis not present

## 2020-12-06 DIAGNOSIS — E1142 Type 2 diabetes mellitus with diabetic polyneuropathy: Secondary | ICD-10-CM | POA: Diagnosis not present

## 2020-12-06 DIAGNOSIS — M069 Rheumatoid arthritis, unspecified: Secondary | ICD-10-CM | POA: Diagnosis not present

## 2020-12-06 DIAGNOSIS — J4521 Mild intermittent asthma with (acute) exacerbation: Secondary | ICD-10-CM | POA: Diagnosis not present

## 2020-12-06 DIAGNOSIS — K219 Gastro-esophageal reflux disease without esophagitis: Secondary | ICD-10-CM | POA: Diagnosis not present

## 2020-12-06 DIAGNOSIS — N1831 Chronic kidney disease, stage 3a: Secondary | ICD-10-CM | POA: Diagnosis not present

## 2020-12-06 DIAGNOSIS — M4720 Other spondylosis with radiculopathy, site unspecified: Secondary | ICD-10-CM | POA: Diagnosis not present

## 2020-12-06 DIAGNOSIS — H409 Unspecified glaucoma: Secondary | ICD-10-CM | POA: Diagnosis not present

## 2020-12-06 DIAGNOSIS — N400 Enlarged prostate without lower urinary tract symptoms: Secondary | ICD-10-CM | POA: Diagnosis not present

## 2020-12-16 DIAGNOSIS — H401111 Primary open-angle glaucoma, right eye, mild stage: Secondary | ICD-10-CM | POA: Diagnosis not present

## 2020-12-27 ENCOUNTER — Other Ambulatory Visit: Payer: Self-pay | Admitting: Neurological Surgery

## 2020-12-27 ENCOUNTER — Ambulatory Visit
Admission: RE | Admit: 2020-12-27 | Discharge: 2020-12-27 | Disposition: A | Discharge status: Home / Self Care | Payer: PPO | Source: Ambulatory Visit | Attending: Neurological Surgery | Admitting: Neurological Surgery

## 2020-12-27 DIAGNOSIS — M4802 Spinal stenosis, cervical region: Secondary | ICD-10-CM | POA: Diagnosis not present

## 2020-12-27 DIAGNOSIS — M4803 Spinal stenosis, cervicothoracic region: Secondary | ICD-10-CM | POA: Diagnosis not present

## 2020-12-27 DIAGNOSIS — M4319 Spondylolisthesis, multiple sites in spine: Secondary | ICD-10-CM | POA: Diagnosis not present

## 2020-12-27 DIAGNOSIS — M47812 Spondylosis without myelopathy or radiculopathy, cervical region: Secondary | ICD-10-CM

## 2020-12-27 DIAGNOSIS — M4322 Fusion of spine, cervical region: Secondary | ICD-10-CM | POA: Diagnosis not present

## 2020-12-30 DIAGNOSIS — I10 Essential (primary) hypertension: Secondary | ICD-10-CM | POA: Diagnosis not present

## 2020-12-30 DIAGNOSIS — E782 Mixed hyperlipidemia: Secondary | ICD-10-CM | POA: Diagnosis not present

## 2020-12-30 DIAGNOSIS — N1831 Chronic kidney disease, stage 3a: Secondary | ICD-10-CM | POA: Diagnosis not present

## 2020-12-30 DIAGNOSIS — M4722 Other spondylosis with radiculopathy, cervical region: Secondary | ICD-10-CM | POA: Diagnosis not present

## 2020-12-30 DIAGNOSIS — E1142 Type 2 diabetes mellitus with diabetic polyneuropathy: Secondary | ICD-10-CM | POA: Diagnosis not present

## 2020-12-30 DIAGNOSIS — R7989 Other specified abnormal findings of blood chemistry: Secondary | ICD-10-CM | POA: Diagnosis not present

## 2021-01-03 DIAGNOSIS — N1831 Chronic kidney disease, stage 3a: Secondary | ICD-10-CM | POA: Diagnosis not present

## 2021-01-03 DIAGNOSIS — N400 Enlarged prostate without lower urinary tract symptoms: Secondary | ICD-10-CM | POA: Diagnosis not present

## 2021-01-03 DIAGNOSIS — M069 Rheumatoid arthritis, unspecified: Secondary | ICD-10-CM | POA: Diagnosis not present

## 2021-01-03 DIAGNOSIS — E1142 Type 2 diabetes mellitus with diabetic polyneuropathy: Secondary | ICD-10-CM | POA: Diagnosis not present

## 2021-01-03 DIAGNOSIS — E782 Mixed hyperlipidemia: Secondary | ICD-10-CM | POA: Diagnosis not present

## 2021-01-03 DIAGNOSIS — I10 Essential (primary) hypertension: Secondary | ICD-10-CM | POA: Diagnosis not present

## 2021-01-03 DIAGNOSIS — J4521 Mild intermittent asthma with (acute) exacerbation: Secondary | ICD-10-CM | POA: Diagnosis not present

## 2021-01-03 DIAGNOSIS — K219 Gastro-esophageal reflux disease without esophagitis: Secondary | ICD-10-CM | POA: Diagnosis not present

## 2021-01-03 DIAGNOSIS — G8929 Other chronic pain: Secondary | ICD-10-CM | POA: Diagnosis not present

## 2021-01-03 DIAGNOSIS — F339 Major depressive disorder, recurrent, unspecified: Secondary | ICD-10-CM | POA: Diagnosis not present

## 2021-01-05 DIAGNOSIS — Z6830 Body mass index (BMI) 30.0-30.9, adult: Secondary | ICD-10-CM | POA: Diagnosis not present

## 2021-01-05 DIAGNOSIS — I10 Essential (primary) hypertension: Secondary | ICD-10-CM | POA: Diagnosis not present

## 2021-01-05 DIAGNOSIS — M47812 Spondylosis without myelopathy or radiculopathy, cervical region: Secondary | ICD-10-CM | POA: Diagnosis not present

## 2021-01-07 DIAGNOSIS — H409 Unspecified glaucoma: Secondary | ICD-10-CM | POA: Diagnosis not present

## 2021-01-07 DIAGNOSIS — E782 Mixed hyperlipidemia: Secondary | ICD-10-CM | POA: Diagnosis not present

## 2021-01-07 DIAGNOSIS — J4521 Mild intermittent asthma with (acute) exacerbation: Secondary | ICD-10-CM | POA: Diagnosis not present

## 2021-01-07 DIAGNOSIS — F339 Major depressive disorder, recurrent, unspecified: Secondary | ICD-10-CM | POA: Diagnosis not present

## 2021-01-07 DIAGNOSIS — I10 Essential (primary) hypertension: Secondary | ICD-10-CM | POA: Diagnosis not present

## 2021-01-07 DIAGNOSIS — K219 Gastro-esophageal reflux disease without esophagitis: Secondary | ICD-10-CM | POA: Diagnosis not present

## 2021-01-07 DIAGNOSIS — M069 Rheumatoid arthritis, unspecified: Secondary | ICD-10-CM | POA: Diagnosis not present

## 2021-01-07 DIAGNOSIS — E1142 Type 2 diabetes mellitus with diabetic polyneuropathy: Secondary | ICD-10-CM | POA: Diagnosis not present

## 2021-01-07 DIAGNOSIS — N400 Enlarged prostate without lower urinary tract symptoms: Secondary | ICD-10-CM | POA: Diagnosis not present

## 2021-01-07 DIAGNOSIS — G8929 Other chronic pain: Secondary | ICD-10-CM | POA: Diagnosis not present

## 2021-01-07 DIAGNOSIS — N1831 Chronic kidney disease, stage 3a: Secondary | ICD-10-CM | POA: Diagnosis not present

## 2021-01-07 DIAGNOSIS — M4720 Other spondylosis with radiculopathy, site unspecified: Secondary | ICD-10-CM | POA: Diagnosis not present

## 2021-01-12 DIAGNOSIS — M5136 Other intervertebral disc degeneration, lumbar region: Secondary | ICD-10-CM | POA: Diagnosis not present

## 2021-01-12 DIAGNOSIS — M47812 Spondylosis without myelopathy or radiculopathy, cervical region: Secondary | ICD-10-CM | POA: Diagnosis not present

## 2021-01-25 DIAGNOSIS — M4722 Other spondylosis with radiculopathy, cervical region: Secondary | ICD-10-CM | POA: Diagnosis not present

## 2021-01-25 DIAGNOSIS — N1831 Chronic kidney disease, stage 3a: Secondary | ICD-10-CM | POA: Diagnosis not present

## 2021-01-25 DIAGNOSIS — R490 Dysphonia: Secondary | ICD-10-CM | POA: Diagnosis not present

## 2021-01-25 DIAGNOSIS — M069 Rheumatoid arthritis, unspecified: Secondary | ICD-10-CM | POA: Diagnosis not present

## 2021-01-25 DIAGNOSIS — E1142 Type 2 diabetes mellitus with diabetic polyneuropathy: Secondary | ICD-10-CM | POA: Diagnosis not present

## 2021-01-25 DIAGNOSIS — G8929 Other chronic pain: Secondary | ICD-10-CM | POA: Diagnosis not present

## 2021-01-25 DIAGNOSIS — F419 Anxiety disorder, unspecified: Secondary | ICD-10-CM | POA: Diagnosis not present

## 2021-01-25 DIAGNOSIS — I10 Essential (primary) hypertension: Secondary | ICD-10-CM | POA: Diagnosis not present

## 2021-01-25 DIAGNOSIS — F339 Major depressive disorder, recurrent, unspecified: Secondary | ICD-10-CM | POA: Diagnosis not present

## 2021-01-25 DIAGNOSIS — K219 Gastro-esophageal reflux disease without esophagitis: Secondary | ICD-10-CM | POA: Diagnosis not present

## 2021-01-25 DIAGNOSIS — M1990 Unspecified osteoarthritis, unspecified site: Secondary | ICD-10-CM | POA: Diagnosis not present

## 2021-02-04 DIAGNOSIS — R519 Headache, unspecified: Secondary | ICD-10-CM | POA: Diagnosis not present

## 2021-02-04 DIAGNOSIS — M791 Myalgia, unspecified site: Secondary | ICD-10-CM | POA: Diagnosis not present

## 2021-02-04 DIAGNOSIS — M545 Low back pain, unspecified: Secondary | ICD-10-CM | POA: Diagnosis not present

## 2021-02-04 DIAGNOSIS — M2560 Stiffness of unspecified joint, not elsewhere classified: Secondary | ICD-10-CM | POA: Diagnosis not present

## 2021-02-04 DIAGNOSIS — M542 Cervicalgia: Secondary | ICD-10-CM | POA: Diagnosis not present

## 2021-02-11 DIAGNOSIS — M545 Low back pain, unspecified: Secondary | ICD-10-CM | POA: Diagnosis not present

## 2021-02-11 DIAGNOSIS — M542 Cervicalgia: Secondary | ICD-10-CM | POA: Diagnosis not present

## 2021-02-11 DIAGNOSIS — M791 Myalgia, unspecified site: Secondary | ICD-10-CM | POA: Diagnosis not present

## 2021-02-11 DIAGNOSIS — R519 Headache, unspecified: Secondary | ICD-10-CM | POA: Diagnosis not present

## 2021-02-11 DIAGNOSIS — M2560 Stiffness of unspecified joint, not elsewhere classified: Secondary | ICD-10-CM | POA: Diagnosis not present

## 2021-02-17 DIAGNOSIS — R1314 Dysphagia, pharyngoesophageal phase: Secondary | ICD-10-CM | POA: Diagnosis not present

## 2021-02-17 DIAGNOSIS — Z8739 Personal history of other diseases of the musculoskeletal system and connective tissue: Secondary | ICD-10-CM | POA: Diagnosis not present

## 2021-02-17 DIAGNOSIS — K219 Gastro-esophageal reflux disease without esophagitis: Secondary | ICD-10-CM | POA: Diagnosis not present

## 2021-02-25 DIAGNOSIS — M069 Rheumatoid arthritis, unspecified: Secondary | ICD-10-CM | POA: Diagnosis not present

## 2021-02-25 DIAGNOSIS — E782 Mixed hyperlipidemia: Secondary | ICD-10-CM | POA: Diagnosis not present

## 2021-02-25 DIAGNOSIS — H409 Unspecified glaucoma: Secondary | ICD-10-CM | POA: Diagnosis not present

## 2021-02-25 DIAGNOSIS — E1142 Type 2 diabetes mellitus with diabetic polyneuropathy: Secondary | ICD-10-CM | POA: Diagnosis not present

## 2021-02-25 DIAGNOSIS — M1990 Unspecified osteoarthritis, unspecified site: Secondary | ICD-10-CM | POA: Diagnosis not present

## 2021-02-25 DIAGNOSIS — I10 Essential (primary) hypertension: Secondary | ICD-10-CM | POA: Diagnosis not present

## 2021-02-25 DIAGNOSIS — K219 Gastro-esophageal reflux disease without esophagitis: Secondary | ICD-10-CM | POA: Diagnosis not present

## 2021-02-25 DIAGNOSIS — F339 Major depressive disorder, recurrent, unspecified: Secondary | ICD-10-CM | POA: Diagnosis not present

## 2021-02-25 DIAGNOSIS — N1831 Chronic kidney disease, stage 3a: Secondary | ICD-10-CM | POA: Diagnosis not present

## 2021-02-25 DIAGNOSIS — G8929 Other chronic pain: Secondary | ICD-10-CM | POA: Diagnosis not present

## 2021-02-25 DIAGNOSIS — Z6829 Body mass index (BMI) 29.0-29.9, adult: Secondary | ICD-10-CM | POA: Diagnosis not present

## 2021-02-25 DIAGNOSIS — M47812 Spondylosis without myelopathy or radiculopathy, cervical region: Secondary | ICD-10-CM | POA: Diagnosis not present

## 2021-02-25 DIAGNOSIS — N400 Enlarged prostate without lower urinary tract symptoms: Secondary | ICD-10-CM | POA: Diagnosis not present

## 2021-02-25 DIAGNOSIS — M479 Spondylosis, unspecified: Secondary | ICD-10-CM | POA: Diagnosis not present

## 2021-03-17 DIAGNOSIS — M479 Spondylosis, unspecified: Secondary | ICD-10-CM | POA: Diagnosis not present

## 2021-03-17 DIAGNOSIS — G629 Polyneuropathy, unspecified: Secondary | ICD-10-CM | POA: Diagnosis not present

## 2021-03-17 DIAGNOSIS — E1142 Type 2 diabetes mellitus with diabetic polyneuropathy: Secondary | ICD-10-CM | POA: Diagnosis not present

## 2021-03-17 DIAGNOSIS — L244 Irritant contact dermatitis due to drugs in contact with skin: Secondary | ICD-10-CM | POA: Diagnosis not present

## 2021-03-17 DIAGNOSIS — I10 Essential (primary) hypertension: Secondary | ICD-10-CM | POA: Diagnosis not present

## 2021-03-17 DIAGNOSIS — E782 Mixed hyperlipidemia: Secondary | ICD-10-CM | POA: Diagnosis not present

## 2021-03-17 DIAGNOSIS — K219 Gastro-esophageal reflux disease without esophagitis: Secondary | ICD-10-CM | POA: Diagnosis not present

## 2021-03-17 DIAGNOSIS — M1990 Unspecified osteoarthritis, unspecified site: Secondary | ICD-10-CM | POA: Diagnosis not present

## 2021-03-17 DIAGNOSIS — N1831 Chronic kidney disease, stage 3a: Secondary | ICD-10-CM | POA: Diagnosis not present

## 2021-03-17 DIAGNOSIS — F339 Major depressive disorder, recurrent, unspecified: Secondary | ICD-10-CM | POA: Diagnosis not present

## 2021-03-18 DIAGNOSIS — K219 Gastro-esophageal reflux disease without esophagitis: Secondary | ICD-10-CM | POA: Diagnosis not present

## 2021-03-18 DIAGNOSIS — G8929 Other chronic pain: Secondary | ICD-10-CM | POA: Diagnosis not present

## 2021-03-18 DIAGNOSIS — E782 Mixed hyperlipidemia: Secondary | ICD-10-CM | POA: Diagnosis not present

## 2021-03-18 DIAGNOSIS — N1831 Chronic kidney disease, stage 3a: Secondary | ICD-10-CM | POA: Diagnosis not present

## 2021-03-18 DIAGNOSIS — N400 Enlarged prostate without lower urinary tract symptoms: Secondary | ICD-10-CM | POA: Diagnosis not present

## 2021-03-18 DIAGNOSIS — H409 Unspecified glaucoma: Secondary | ICD-10-CM | POA: Diagnosis not present

## 2021-03-18 DIAGNOSIS — I10 Essential (primary) hypertension: Secondary | ICD-10-CM | POA: Diagnosis not present

## 2021-03-18 DIAGNOSIS — E1142 Type 2 diabetes mellitus with diabetic polyneuropathy: Secondary | ICD-10-CM | POA: Diagnosis not present

## 2021-03-18 DIAGNOSIS — M069 Rheumatoid arthritis, unspecified: Secondary | ICD-10-CM | POA: Diagnosis not present

## 2021-03-18 DIAGNOSIS — J4521 Mild intermittent asthma with (acute) exacerbation: Secondary | ICD-10-CM | POA: Diagnosis not present

## 2021-03-29 DIAGNOSIS — E291 Testicular hypofunction: Secondary | ICD-10-CM | POA: Diagnosis not present

## 2021-03-29 DIAGNOSIS — R948 Abnormal results of function studies of other organs and systems: Secondary | ICD-10-CM | POA: Diagnosis not present

## 2021-04-05 DIAGNOSIS — E291 Testicular hypofunction: Secondary | ICD-10-CM | POA: Diagnosis not present

## 2021-04-19 DIAGNOSIS — H401131 Primary open-angle glaucoma, bilateral, mild stage: Secondary | ICD-10-CM | POA: Diagnosis not present

## 2021-04-26 DIAGNOSIS — N400 Enlarged prostate without lower urinary tract symptoms: Secondary | ICD-10-CM | POA: Diagnosis not present

## 2021-04-26 DIAGNOSIS — I10 Essential (primary) hypertension: Secondary | ICD-10-CM | POA: Diagnosis not present

## 2021-04-26 DIAGNOSIS — N1831 Chronic kidney disease, stage 3a: Secondary | ICD-10-CM | POA: Diagnosis not present

## 2021-04-26 DIAGNOSIS — J4521 Mild intermittent asthma with (acute) exacerbation: Secondary | ICD-10-CM | POA: Diagnosis not present

## 2021-04-26 DIAGNOSIS — M069 Rheumatoid arthritis, unspecified: Secondary | ICD-10-CM | POA: Diagnosis not present

## 2021-04-26 DIAGNOSIS — G8929 Other chronic pain: Secondary | ICD-10-CM | POA: Diagnosis not present

## 2021-04-26 DIAGNOSIS — E1142 Type 2 diabetes mellitus with diabetic polyneuropathy: Secondary | ICD-10-CM | POA: Diagnosis not present

## 2021-04-26 DIAGNOSIS — K219 Gastro-esophageal reflux disease without esophagitis: Secondary | ICD-10-CM | POA: Diagnosis not present

## 2021-04-26 DIAGNOSIS — E782 Mixed hyperlipidemia: Secondary | ICD-10-CM | POA: Diagnosis not present

## 2021-05-04 DIAGNOSIS — M542 Cervicalgia: Secondary | ICD-10-CM | POA: Diagnosis not present

## 2021-05-04 DIAGNOSIS — M5412 Radiculopathy, cervical region: Secondary | ICD-10-CM | POA: Diagnosis not present

## 2021-05-05 DIAGNOSIS — Z1389 Encounter for screening for other disorder: Secondary | ICD-10-CM | POA: Diagnosis not present

## 2021-05-05 DIAGNOSIS — Z0000 Encounter for general adult medical examination without abnormal findings: Secondary | ICD-10-CM | POA: Diagnosis not present

## 2021-05-17 DIAGNOSIS — E1142 Type 2 diabetes mellitus with diabetic polyneuropathy: Secondary | ICD-10-CM | POA: Diagnosis not present

## 2021-05-17 DIAGNOSIS — N1831 Chronic kidney disease, stage 3a: Secondary | ICD-10-CM | POA: Diagnosis not present

## 2021-05-17 DIAGNOSIS — M1990 Unspecified osteoarthritis, unspecified site: Secondary | ICD-10-CM | POA: Diagnosis not present

## 2021-05-17 DIAGNOSIS — E782 Mixed hyperlipidemia: Secondary | ICD-10-CM | POA: Diagnosis not present

## 2021-05-17 DIAGNOSIS — M479 Spondylosis, unspecified: Secondary | ICD-10-CM | POA: Diagnosis not present

## 2021-05-17 DIAGNOSIS — I10 Essential (primary) hypertension: Secondary | ICD-10-CM | POA: Diagnosis not present

## 2021-05-17 DIAGNOSIS — F339 Major depressive disorder, recurrent, unspecified: Secondary | ICD-10-CM | POA: Diagnosis not present

## 2021-05-17 DIAGNOSIS — K219 Gastro-esophageal reflux disease without esophagitis: Secondary | ICD-10-CM | POA: Diagnosis not present

## 2021-05-17 DIAGNOSIS — G629 Polyneuropathy, unspecified: Secondary | ICD-10-CM | POA: Diagnosis not present

## 2021-05-24 DIAGNOSIS — E119 Type 2 diabetes mellitus without complications: Secondary | ICD-10-CM | POA: Diagnosis not present

## 2021-06-20 DIAGNOSIS — M069 Rheumatoid arthritis, unspecified: Secondary | ICD-10-CM | POA: Diagnosis not present

## 2021-06-20 DIAGNOSIS — E782 Mixed hyperlipidemia: Secondary | ICD-10-CM | POA: Diagnosis not present

## 2021-06-20 DIAGNOSIS — J4521 Mild intermittent asthma with (acute) exacerbation: Secondary | ICD-10-CM | POA: Diagnosis not present

## 2021-06-20 DIAGNOSIS — I10 Essential (primary) hypertension: Secondary | ICD-10-CM | POA: Diagnosis not present

## 2021-06-20 DIAGNOSIS — E1142 Type 2 diabetes mellitus with diabetic polyneuropathy: Secondary | ICD-10-CM | POA: Diagnosis not present

## 2021-06-20 DIAGNOSIS — N400 Enlarged prostate without lower urinary tract symptoms: Secondary | ICD-10-CM | POA: Diagnosis not present

## 2021-06-20 DIAGNOSIS — N1831 Chronic kidney disease, stage 3a: Secondary | ICD-10-CM | POA: Diagnosis not present

## 2021-06-20 DIAGNOSIS — K219 Gastro-esophageal reflux disease without esophagitis: Secondary | ICD-10-CM | POA: Diagnosis not present

## 2021-06-20 DIAGNOSIS — G8929 Other chronic pain: Secondary | ICD-10-CM | POA: Diagnosis not present

## 2021-06-20 DIAGNOSIS — F339 Major depressive disorder, recurrent, unspecified: Secondary | ICD-10-CM | POA: Diagnosis not present

## 2021-06-20 DIAGNOSIS — M4720 Other spondylosis with radiculopathy, site unspecified: Secondary | ICD-10-CM | POA: Diagnosis not present

## 2021-08-15 DIAGNOSIS — N1831 Chronic kidney disease, stage 3a: Secondary | ICD-10-CM | POA: Diagnosis not present

## 2021-08-15 DIAGNOSIS — M1990 Unspecified osteoarthritis, unspecified site: Secondary | ICD-10-CM | POA: Diagnosis not present

## 2021-08-15 DIAGNOSIS — F339 Major depressive disorder, recurrent, unspecified: Secondary | ICD-10-CM | POA: Diagnosis not present

## 2021-08-15 DIAGNOSIS — M4720 Other spondylosis with radiculopathy, site unspecified: Secondary | ICD-10-CM | POA: Diagnosis not present

## 2021-08-15 DIAGNOSIS — K219 Gastro-esophageal reflux disease without esophagitis: Secondary | ICD-10-CM | POA: Diagnosis not present

## 2021-08-15 DIAGNOSIS — Z79899 Other long term (current) drug therapy: Secondary | ICD-10-CM | POA: Diagnosis not present

## 2021-08-15 DIAGNOSIS — E782 Mixed hyperlipidemia: Secondary | ICD-10-CM | POA: Diagnosis not present

## 2021-08-15 DIAGNOSIS — N400 Enlarged prostate without lower urinary tract symptoms: Secondary | ICD-10-CM | POA: Diagnosis not present

## 2021-08-15 DIAGNOSIS — I10 Essential (primary) hypertension: Secondary | ICD-10-CM | POA: Diagnosis not present

## 2021-08-15 DIAGNOSIS — J4521 Mild intermittent asthma with (acute) exacerbation: Secondary | ICD-10-CM | POA: Diagnosis not present

## 2021-08-15 DIAGNOSIS — E1142 Type 2 diabetes mellitus with diabetic polyneuropathy: Secondary | ICD-10-CM | POA: Diagnosis not present

## 2021-08-15 DIAGNOSIS — Z23 Encounter for immunization: Secondary | ICD-10-CM | POA: Diagnosis not present

## 2021-08-19 DIAGNOSIS — R944 Abnormal results of kidney function studies: Secondary | ICD-10-CM | POA: Diagnosis not present

## 2021-08-19 DIAGNOSIS — R7989 Other specified abnormal findings of blood chemistry: Secondary | ICD-10-CM | POA: Diagnosis not present

## 2021-08-19 DIAGNOSIS — N1831 Chronic kidney disease, stage 3a: Secondary | ICD-10-CM | POA: Diagnosis not present

## 2021-08-19 DIAGNOSIS — N200 Calculus of kidney: Secondary | ICD-10-CM | POA: Diagnosis not present

## 2021-08-24 DIAGNOSIS — H401111 Primary open-angle glaucoma, right eye, mild stage: Secondary | ICD-10-CM | POA: Diagnosis not present

## 2021-08-25 DIAGNOSIS — E782 Mixed hyperlipidemia: Secondary | ICD-10-CM | POA: Diagnosis not present

## 2021-08-25 DIAGNOSIS — J4521 Mild intermittent asthma with (acute) exacerbation: Secondary | ICD-10-CM | POA: Diagnosis not present

## 2021-08-25 DIAGNOSIS — N400 Enlarged prostate without lower urinary tract symptoms: Secondary | ICD-10-CM | POA: Diagnosis not present

## 2021-08-25 DIAGNOSIS — M1990 Unspecified osteoarthritis, unspecified site: Secondary | ICD-10-CM | POA: Diagnosis not present

## 2021-08-25 DIAGNOSIS — K219 Gastro-esophageal reflux disease without esophagitis: Secondary | ICD-10-CM | POA: Diagnosis not present

## 2021-08-25 DIAGNOSIS — N1831 Chronic kidney disease, stage 3a: Secondary | ICD-10-CM | POA: Diagnosis not present

## 2021-08-25 DIAGNOSIS — M069 Rheumatoid arthritis, unspecified: Secondary | ICD-10-CM | POA: Diagnosis not present

## 2021-08-25 DIAGNOSIS — F339 Major depressive disorder, recurrent, unspecified: Secondary | ICD-10-CM | POA: Diagnosis not present

## 2021-08-25 DIAGNOSIS — G8929 Other chronic pain: Secondary | ICD-10-CM | POA: Diagnosis not present

## 2021-08-25 DIAGNOSIS — H409 Unspecified glaucoma: Secondary | ICD-10-CM | POA: Diagnosis not present

## 2021-08-25 DIAGNOSIS — I10 Essential (primary) hypertension: Secondary | ICD-10-CM | POA: Diagnosis not present

## 2021-08-25 DIAGNOSIS — E1142 Type 2 diabetes mellitus with diabetic polyneuropathy: Secondary | ICD-10-CM | POA: Diagnosis not present

## 2021-09-27 DIAGNOSIS — E291 Testicular hypofunction: Secondary | ICD-10-CM | POA: Diagnosis not present

## 2021-09-30 DIAGNOSIS — R948 Abnormal results of function studies of other organs and systems: Secondary | ICD-10-CM | POA: Diagnosis not present

## 2021-09-30 DIAGNOSIS — R3912 Poor urinary stream: Secondary | ICD-10-CM | POA: Diagnosis not present

## 2021-09-30 DIAGNOSIS — E291 Testicular hypofunction: Secondary | ICD-10-CM | POA: Diagnosis not present

## 2021-11-09 DIAGNOSIS — H401131 Primary open-angle glaucoma, bilateral, mild stage: Secondary | ICD-10-CM | POA: Diagnosis not present

## 2021-12-06 DIAGNOSIS — Z85828 Personal history of other malignant neoplasm of skin: Secondary | ICD-10-CM | POA: Diagnosis not present

## 2021-12-06 DIAGNOSIS — C44622 Squamous cell carcinoma of skin of right upper limb, including shoulder: Secondary | ICD-10-CM | POA: Diagnosis not present

## 2021-12-06 DIAGNOSIS — L218 Other seborrheic dermatitis: Secondary | ICD-10-CM | POA: Diagnosis not present

## 2021-12-06 DIAGNOSIS — L821 Other seborrheic keratosis: Secondary | ICD-10-CM | POA: Diagnosis not present

## 2021-12-06 DIAGNOSIS — D485 Neoplasm of uncertain behavior of skin: Secondary | ICD-10-CM | POA: Diagnosis not present

## 2021-12-06 DIAGNOSIS — L820 Inflamed seborrheic keratosis: Secondary | ICD-10-CM | POA: Diagnosis not present

## 2021-12-06 DIAGNOSIS — L570 Actinic keratosis: Secondary | ICD-10-CM | POA: Diagnosis not present

## 2021-12-12 DIAGNOSIS — G8929 Other chronic pain: Secondary | ICD-10-CM | POA: Diagnosis not present

## 2021-12-12 DIAGNOSIS — E1142 Type 2 diabetes mellitus with diabetic polyneuropathy: Secondary | ICD-10-CM | POA: Diagnosis not present

## 2021-12-12 DIAGNOSIS — M4722 Other spondylosis with radiculopathy, cervical region: Secondary | ICD-10-CM | POA: Diagnosis not present

## 2021-12-12 DIAGNOSIS — K219 Gastro-esophageal reflux disease without esophagitis: Secondary | ICD-10-CM | POA: Diagnosis not present

## 2021-12-12 DIAGNOSIS — N1831 Chronic kidney disease, stage 3a: Secondary | ICD-10-CM | POA: Diagnosis not present

## 2021-12-12 DIAGNOSIS — M479 Spondylosis, unspecified: Secondary | ICD-10-CM | POA: Diagnosis not present

## 2021-12-12 DIAGNOSIS — N400 Enlarged prostate without lower urinary tract symptoms: Secondary | ICD-10-CM | POA: Diagnosis not present

## 2021-12-12 DIAGNOSIS — E782 Mixed hyperlipidemia: Secondary | ICD-10-CM | POA: Diagnosis not present

## 2021-12-12 DIAGNOSIS — J4521 Mild intermittent asthma with (acute) exacerbation: Secondary | ICD-10-CM | POA: Diagnosis not present

## 2021-12-12 DIAGNOSIS — I10 Essential (primary) hypertension: Secondary | ICD-10-CM | POA: Diagnosis not present

## 2021-12-12 DIAGNOSIS — F339 Major depressive disorder, recurrent, unspecified: Secondary | ICD-10-CM | POA: Diagnosis not present

## 2021-12-26 DIAGNOSIS — J4521 Mild intermittent asthma with (acute) exacerbation: Secondary | ICD-10-CM | POA: Diagnosis not present

## 2021-12-26 DIAGNOSIS — E782 Mixed hyperlipidemia: Secondary | ICD-10-CM | POA: Diagnosis not present

## 2021-12-26 DIAGNOSIS — E1142 Type 2 diabetes mellitus with diabetic polyneuropathy: Secondary | ICD-10-CM | POA: Diagnosis not present

## 2021-12-26 DIAGNOSIS — F339 Major depressive disorder, recurrent, unspecified: Secondary | ICD-10-CM | POA: Diagnosis not present

## 2021-12-26 DIAGNOSIS — N1831 Chronic kidney disease, stage 3a: Secondary | ICD-10-CM | POA: Diagnosis not present

## 2021-12-26 DIAGNOSIS — K219 Gastro-esophageal reflux disease without esophagitis: Secondary | ICD-10-CM | POA: Diagnosis not present

## 2021-12-26 DIAGNOSIS — I10 Essential (primary) hypertension: Secondary | ICD-10-CM | POA: Diagnosis not present

## 2021-12-28 DIAGNOSIS — Z6829 Body mass index (BMI) 29.0-29.9, adult: Secondary | ICD-10-CM | POA: Diagnosis not present

## 2021-12-28 DIAGNOSIS — M542 Cervicalgia: Secondary | ICD-10-CM | POA: Diagnosis not present

## 2021-12-28 DIAGNOSIS — M5412 Radiculopathy, cervical region: Secondary | ICD-10-CM | POA: Diagnosis not present

## 2022-01-11 DIAGNOSIS — N1831 Chronic kidney disease, stage 3a: Secondary | ICD-10-CM | POA: Diagnosis not present

## 2022-01-12 DIAGNOSIS — Z1211 Encounter for screening for malignant neoplasm of colon: Secondary | ICD-10-CM | POA: Diagnosis not present

## 2022-02-27 DIAGNOSIS — H401131 Primary open-angle glaucoma, bilateral, mild stage: Secondary | ICD-10-CM | POA: Diagnosis not present

## 2022-03-21 DIAGNOSIS — E291 Testicular hypofunction: Secondary | ICD-10-CM | POA: Diagnosis not present

## 2022-03-21 DIAGNOSIS — R972 Elevated prostate specific antigen [PSA]: Secondary | ICD-10-CM | POA: Diagnosis not present

## 2022-03-28 DIAGNOSIS — E291 Testicular hypofunction: Secondary | ICD-10-CM | POA: Diagnosis not present

## 2022-03-28 DIAGNOSIS — N411 Chronic prostatitis: Secondary | ICD-10-CM | POA: Diagnosis not present

## 2022-04-03 DIAGNOSIS — N200 Calculus of kidney: Secondary | ICD-10-CM | POA: Diagnosis not present

## 2022-04-05 DIAGNOSIS — N1831 Chronic kidney disease, stage 3a: Secondary | ICD-10-CM | POA: Diagnosis not present

## 2022-04-05 DIAGNOSIS — E782 Mixed hyperlipidemia: Secondary | ICD-10-CM | POA: Diagnosis not present

## 2022-04-05 DIAGNOSIS — G8929 Other chronic pain: Secondary | ICD-10-CM | POA: Diagnosis not present

## 2022-04-05 DIAGNOSIS — I10 Essential (primary) hypertension: Secondary | ICD-10-CM | POA: Diagnosis not present

## 2022-04-05 DIAGNOSIS — N400 Enlarged prostate without lower urinary tract symptoms: Secondary | ICD-10-CM | POA: Diagnosis not present

## 2022-04-05 DIAGNOSIS — E1142 Type 2 diabetes mellitus with diabetic polyneuropathy: Secondary | ICD-10-CM | POA: Diagnosis not present

## 2022-04-05 DIAGNOSIS — K219 Gastro-esophageal reflux disease without esophagitis: Secondary | ICD-10-CM | POA: Diagnosis not present

## 2022-05-05 DIAGNOSIS — N1831 Chronic kidney disease, stage 3a: Secondary | ICD-10-CM | POA: Diagnosis not present

## 2022-05-05 DIAGNOSIS — E782 Mixed hyperlipidemia: Secondary | ICD-10-CM | POA: Diagnosis not present

## 2022-05-05 DIAGNOSIS — N400 Enlarged prostate without lower urinary tract symptoms: Secondary | ICD-10-CM | POA: Diagnosis not present

## 2022-05-05 DIAGNOSIS — M549 Dorsalgia, unspecified: Secondary | ICD-10-CM | POA: Diagnosis not present

## 2022-05-05 DIAGNOSIS — E1142 Type 2 diabetes mellitus with diabetic polyneuropathy: Secondary | ICD-10-CM | POA: Diagnosis not present

## 2022-05-05 DIAGNOSIS — F339 Major depressive disorder, recurrent, unspecified: Secondary | ICD-10-CM | POA: Diagnosis not present

## 2022-05-05 DIAGNOSIS — Z7984 Long term (current) use of oral hypoglycemic drugs: Secondary | ICD-10-CM | POA: Diagnosis not present

## 2022-05-05 DIAGNOSIS — G629 Polyneuropathy, unspecified: Secondary | ICD-10-CM | POA: Diagnosis not present

## 2022-05-05 DIAGNOSIS — I10 Essential (primary) hypertension: Secondary | ICD-10-CM | POA: Diagnosis not present

## 2022-05-05 DIAGNOSIS — K219 Gastro-esophageal reflux disease without esophagitis: Secondary | ICD-10-CM | POA: Diagnosis not present

## 2022-05-05 DIAGNOSIS — G8929 Other chronic pain: Secondary | ICD-10-CM | POA: Diagnosis not present

## 2022-05-29 DIAGNOSIS — E119 Type 2 diabetes mellitus without complications: Secondary | ICD-10-CM | POA: Diagnosis not present

## 2022-05-29 DIAGNOSIS — D3130 Benign neoplasm of unspecified choroid: Secondary | ICD-10-CM | POA: Diagnosis not present

## 2022-05-29 DIAGNOSIS — H43813 Vitreous degeneration, bilateral: Secondary | ICD-10-CM | POA: Diagnosis not present

## 2022-05-29 DIAGNOSIS — Z961 Presence of intraocular lens: Secondary | ICD-10-CM | POA: Diagnosis not present

## 2022-05-29 DIAGNOSIS — H401111 Primary open-angle glaucoma, right eye, mild stage: Secondary | ICD-10-CM | POA: Diagnosis not present

## 2022-05-29 DIAGNOSIS — H401123 Primary open-angle glaucoma, left eye, severe stage: Secondary | ICD-10-CM | POA: Diagnosis not present

## 2022-06-27 DIAGNOSIS — M5412 Radiculopathy, cervical region: Secondary | ICD-10-CM | POA: Diagnosis not present

## 2022-07-21 DIAGNOSIS — Z23 Encounter for immunization: Secondary | ICD-10-CM | POA: Diagnosis not present

## 2022-08-23 DIAGNOSIS — M5412 Radiculopathy, cervical region: Secondary | ICD-10-CM | POA: Diagnosis not present

## 2022-08-28 DIAGNOSIS — H401111 Primary open-angle glaucoma, right eye, mild stage: Secondary | ICD-10-CM | POA: Diagnosis not present

## 2022-09-04 DIAGNOSIS — N1831 Chronic kidney disease, stage 3a: Secondary | ICD-10-CM | POA: Diagnosis not present

## 2022-09-04 DIAGNOSIS — K219 Gastro-esophageal reflux disease without esophagitis: Secondary | ICD-10-CM | POA: Diagnosis not present

## 2022-09-04 DIAGNOSIS — N400 Enlarged prostate without lower urinary tract symptoms: Secondary | ICD-10-CM | POA: Diagnosis not present

## 2022-09-04 DIAGNOSIS — I10 Essential (primary) hypertension: Secondary | ICD-10-CM | POA: Diagnosis not present

## 2022-09-04 DIAGNOSIS — F339 Major depressive disorder, recurrent, unspecified: Secondary | ICD-10-CM | POA: Diagnosis not present

## 2022-09-04 DIAGNOSIS — E782 Mixed hyperlipidemia: Secondary | ICD-10-CM | POA: Diagnosis not present

## 2022-09-04 DIAGNOSIS — E1142 Type 2 diabetes mellitus with diabetic polyneuropathy: Secondary | ICD-10-CM | POA: Diagnosis not present

## 2022-09-04 DIAGNOSIS — G8929 Other chronic pain: Secondary | ICD-10-CM | POA: Diagnosis not present

## 2022-09-07 DIAGNOSIS — I10 Essential (primary) hypertension: Secondary | ICD-10-CM | POA: Diagnosis not present

## 2022-09-07 DIAGNOSIS — E291 Testicular hypofunction: Secondary | ICD-10-CM | POA: Diagnosis not present

## 2022-09-07 DIAGNOSIS — Z6829 Body mass index (BMI) 29.0-29.9, adult: Secondary | ICD-10-CM | POA: Diagnosis not present

## 2022-09-07 DIAGNOSIS — K219 Gastro-esophageal reflux disease without esophagitis: Secondary | ICD-10-CM | POA: Diagnosis not present

## 2022-09-07 DIAGNOSIS — E1142 Type 2 diabetes mellitus with diabetic polyneuropathy: Secondary | ICD-10-CM | POA: Diagnosis not present

## 2022-09-07 DIAGNOSIS — G894 Chronic pain syndrome: Secondary | ICD-10-CM | POA: Diagnosis not present

## 2022-09-07 DIAGNOSIS — E782 Mixed hyperlipidemia: Secondary | ICD-10-CM | POA: Diagnosis not present

## 2022-09-07 DIAGNOSIS — Z79899 Other long term (current) drug therapy: Secondary | ICD-10-CM | POA: Diagnosis not present

## 2022-09-07 DIAGNOSIS — F419 Anxiety disorder, unspecified: Secondary | ICD-10-CM | POA: Diagnosis not present

## 2022-09-07 DIAGNOSIS — F339 Major depressive disorder, recurrent, unspecified: Secondary | ICD-10-CM | POA: Diagnosis not present

## 2022-09-07 DIAGNOSIS — M545 Low back pain, unspecified: Secondary | ICD-10-CM | POA: Diagnosis not present

## 2022-09-15 ENCOUNTER — Other Ambulatory Visit (HOSPITAL_COMMUNITY): Payer: Self-pay | Admitting: Neurological Surgery

## 2022-09-15 DIAGNOSIS — M5412 Radiculopathy, cervical region: Secondary | ICD-10-CM

## 2022-09-18 ENCOUNTER — Other Ambulatory Visit: Payer: Self-pay

## 2022-09-18 ENCOUNTER — Other Ambulatory Visit (HOSPITAL_COMMUNITY): Payer: Self-pay | Admitting: Neurological Surgery

## 2022-09-18 ENCOUNTER — Ambulatory Visit (HOSPITAL_COMMUNITY)
Admission: RE | Admit: 2022-09-18 | Discharge: 2022-09-18 | Disposition: A | Discharge status: Home / Self Care | Payer: PPO | Source: Ambulatory Visit | Attending: Neurological Surgery | Admitting: Neurological Surgery

## 2022-09-18 DIAGNOSIS — M5136 Other intervertebral disc degeneration, lumbar region: Secondary | ICD-10-CM | POA: Insufficient documentation

## 2022-09-18 DIAGNOSIS — M25519 Pain in unspecified shoulder: Secondary | ICD-10-CM | POA: Diagnosis not present

## 2022-09-18 DIAGNOSIS — M4312 Spondylolisthesis, cervical region: Secondary | ICD-10-CM | POA: Diagnosis not present

## 2022-09-18 DIAGNOSIS — M48061 Spinal stenosis, lumbar region without neurogenic claudication: Secondary | ICD-10-CM | POA: Insufficient documentation

## 2022-09-18 DIAGNOSIS — M2578 Osteophyte, vertebrae: Secondary | ICD-10-CM | POA: Insufficient documentation

## 2022-09-18 DIAGNOSIS — R531 Weakness: Secondary | ICD-10-CM | POA: Diagnosis not present

## 2022-09-18 DIAGNOSIS — M47812 Spondylosis without myelopathy or radiculopathy, cervical region: Secondary | ICD-10-CM | POA: Diagnosis not present

## 2022-09-18 DIAGNOSIS — M5412 Radiculopathy, cervical region: Secondary | ICD-10-CM

## 2022-09-18 DIAGNOSIS — N200 Calculus of kidney: Secondary | ICD-10-CM | POA: Diagnosis not present

## 2022-09-18 DIAGNOSIS — I2510 Atherosclerotic heart disease of native coronary artery without angina pectoris: Secondary | ICD-10-CM | POA: Diagnosis not present

## 2022-09-18 DIAGNOSIS — M5124 Other intervertebral disc displacement, thoracic region: Secondary | ICD-10-CM | POA: Insufficient documentation

## 2022-09-18 DIAGNOSIS — Z9889 Other specified postprocedural states: Secondary | ICD-10-CM

## 2022-09-18 DIAGNOSIS — M79603 Pain in arm, unspecified: Secondary | ICD-10-CM | POA: Insufficient documentation

## 2022-09-18 DIAGNOSIS — M4712 Other spondylosis with myelopathy, cervical region: Secondary | ICD-10-CM | POA: Insufficient documentation

## 2022-09-18 DIAGNOSIS — M4802 Spinal stenosis, cervical region: Secondary | ICD-10-CM | POA: Diagnosis not present

## 2022-09-18 DIAGNOSIS — M5134 Other intervertebral disc degeneration, thoracic region: Secondary | ICD-10-CM | POA: Diagnosis not present

## 2022-09-18 DIAGNOSIS — Z981 Arthrodesis status: Secondary | ICD-10-CM | POA: Diagnosis not present

## 2022-09-18 DIAGNOSIS — M47814 Spondylosis without myelopathy or radiculopathy, thoracic region: Secondary | ICD-10-CM | POA: Diagnosis not present

## 2022-09-18 LAB — GLUCOSE, CAPILLARY
Glucose-Capillary: 129 mg/dL — ABNORMAL HIGH (ref 70–99)
Glucose-Capillary: 177 mg/dL — ABNORMAL HIGH (ref 70–99)

## 2022-09-18 MED ORDER — DIAZEPAM 5 MG PO TABS
ORAL_TABLET | ORAL | Status: AC
  Filled 2022-09-18: qty 2

## 2022-09-18 MED ORDER — IOHEXOL 300 MG/ML  SOLN
10.0000 mL | Freq: Once | INTRAMUSCULAR | Status: AC | PRN
  Administered 2022-09-18: 9 mL via INTRATHECAL

## 2022-09-18 MED ORDER — OXYCODONE-ACETAMINOPHEN 5-325 MG PO TABS
1.0000 | ORAL_TABLET | ORAL | Status: DC | PRN
  Administered 2022-09-18: 2 via ORAL
  Filled 2022-09-18: qty 2

## 2022-09-18 MED ORDER — DIAZEPAM 5 MG PO TABS
10.0000 mg | ORAL_TABLET | Freq: Once | ORAL | Status: AC
  Administered 2022-09-18: 10 mg via ORAL
  Filled 2022-09-18: qty 2

## 2022-09-18 MED ORDER — ONDANSETRON HCL 4 MG/2ML IJ SOLN: 4.0000 mg | Freq: Four times a day (QID) | INTRAMUSCULAR | Status: DC | PRN

## 2022-09-18 MED ORDER — LIDOCAINE HCL (PF) 1 % IJ SOLN
5.0000 mL | Freq: Once | INTRAMUSCULAR | Status: AC
  Administered 2022-09-18: 5 mL via INTRADERMAL

## 2022-09-18 NOTE — Procedures (Signed)
Seth Schwartz is an 86 year old male who has had extensive spondylitic degeneration and stenosis in the past in the lumbar spine and in the cervical spine he has undergone previous anterior decompression in the cervical spine and has had complaints of increasing shoulder and interscapular pain and weakness in his upper extremities.  He has had extensive lumbar spondylitic disease and has had decompression and fusion in the lumbar spine because of the more recent complaints I have advised the myelogram and postmyelogram CAT scan to look at the cervical and upper thoracic spines this is now being performed.  Pre op Dx: Cervical spondylosis with myelopathy Post op Dx: Same Procedure: Cervical myelogram Surgeon: Seth Schwartz Puncture level: L2-3 Fluid color: Clear colorless Injection: Iohexol 300, 9 mL Findings: Moderate spondylitic ridging throughout the cervical spine with no obstruction to the flow of contrast throughout the thoracic spine but moderate spondylitic changes there.  Further workup with a CT scan of the cervical and the thoracic spines to rule out compressive pathology.

## 2022-09-18 NOTE — Progress Notes (Signed)
Pt and wife received d/c instructions all questions and concerns addressed. Pt left in stable condition.

## 2022-09-18 NOTE — Progress Notes (Signed)
No orders, called Dr Clarice Pole phone and no answer

## 2022-09-20 DIAGNOSIS — S129XXA Fracture of neck, unspecified, initial encounter: Secondary | ICD-10-CM | POA: Diagnosis not present

## 2022-09-20 DIAGNOSIS — M47812 Spondylosis without myelopathy or radiculopathy, cervical region: Secondary | ICD-10-CM | POA: Diagnosis not present

## 2022-10-03 ENCOUNTER — Other Ambulatory Visit: Payer: Self-pay | Admitting: Neurological Surgery

## 2022-10-06 ENCOUNTER — Other Ambulatory Visit: Payer: Self-pay

## 2022-10-06 ENCOUNTER — Encounter (HOSPITAL_COMMUNITY): Payer: Self-pay | Admitting: Neurological Surgery

## 2022-10-06 NOTE — Progress Notes (Addendum)
Mr. Conlee denies chest pain or shortness of breath.  .Patient denies having any s/s of Covid in his household, also denies any known exposure to Covid.  Mr. Jayne 's PCP is StphaineMatthews with [facility].  Mr. Chiarelli has type II diabetes. Patient reports that CBGs run 115-118.  I instructed  Mr. Nylen to hold Metformin on Tuesday am. I instructed patient to check CBG after awaking and every 2 hours until arrival  to the hospital.  I Instructed Mr. Folta if CBG is less than 70 to take 4 Glucose Tablets or 1 tube of Glucose Gel or 1/2 cup of a clear juice. Recheck CBG in 15 minutes if CBG is not over 70 call, pre- op desk at [phone] for further instructions. If scheduled to receive Insulin, do not take Insulin

## 2022-10-10 ENCOUNTER — Inpatient Hospital Stay (HOSPITAL_COMMUNITY): Payer: PPO | Admitting: Certified Registered Nurse Anesthetist

## 2022-10-10 ENCOUNTER — Inpatient Hospital Stay (HOSPITAL_COMMUNITY)
Admission: RE | Admit: 2022-10-10 | Discharge: 2022-10-11 | DRG: 473 | Disposition: A | Discharge status: Home / Self Care | Payer: PPO | Attending: Neurological Surgery | Admitting: Neurological Surgery

## 2022-10-10 ENCOUNTER — Inpatient Hospital Stay (HOSPITAL_COMMUNITY)
Admission: RE | Disposition: A | Discharge status: Home / Self Care | Payer: Self-pay | Source: Home / Self Care | Attending: Neurological Surgery

## 2022-10-10 ENCOUNTER — Inpatient Hospital Stay (HOSPITAL_COMMUNITY): Payer: PPO

## 2022-10-10 ENCOUNTER — Other Ambulatory Visit: Payer: Self-pay

## 2022-10-10 ENCOUNTER — Encounter (HOSPITAL_COMMUNITY): Payer: Self-pay | Admitting: Neurological Surgery

## 2022-10-10 DIAGNOSIS — J45909 Unspecified asthma, uncomplicated: Secondary | ICD-10-CM | POA: Diagnosis present

## 2022-10-10 DIAGNOSIS — G4700 Insomnia, unspecified: Secondary | ICD-10-CM | POA: Diagnosis present

## 2022-10-10 DIAGNOSIS — Z87891 Personal history of nicotine dependence: Secondary | ICD-10-CM

## 2022-10-10 DIAGNOSIS — K219 Gastro-esophageal reflux disease without esophagitis: Secondary | ICD-10-CM | POA: Diagnosis present

## 2022-10-10 DIAGNOSIS — S129XXA Fracture of neck, unspecified, initial encounter: Principal | ICD-10-CM | POA: Diagnosis present

## 2022-10-10 DIAGNOSIS — H409 Unspecified glaucoma: Secondary | ICD-10-CM | POA: Diagnosis not present

## 2022-10-10 DIAGNOSIS — Z882 Allergy status to sulfonamides status: Secondary | ICD-10-CM | POA: Diagnosis not present

## 2022-10-10 DIAGNOSIS — M960 Pseudarthrosis after fusion or arthrodesis: Secondary | ICD-10-CM | POA: Diagnosis not present

## 2022-10-10 DIAGNOSIS — M4722 Other spondylosis with radiculopathy, cervical region: Principal | ICD-10-CM | POA: Diagnosis present

## 2022-10-10 DIAGNOSIS — Z888 Allergy status to other drugs, medicaments and biological substances status: Secondary | ICD-10-CM | POA: Diagnosis not present

## 2022-10-10 DIAGNOSIS — Z7951 Long term (current) use of inhaled steroids: Secondary | ICD-10-CM | POA: Diagnosis not present

## 2022-10-10 DIAGNOSIS — F419 Anxiety disorder, unspecified: Secondary | ICD-10-CM | POA: Diagnosis not present

## 2022-10-10 DIAGNOSIS — M5412 Radiculopathy, cervical region: Secondary | ICD-10-CM

## 2022-10-10 DIAGNOSIS — F32A Depression, unspecified: Secondary | ICD-10-CM | POA: Diagnosis present

## 2022-10-10 DIAGNOSIS — Z7984 Long term (current) use of oral hypoglycemic drugs: Secondary | ICD-10-CM

## 2022-10-10 DIAGNOSIS — Z79899 Other long term (current) drug therapy: Secondary | ICD-10-CM

## 2022-10-10 DIAGNOSIS — E213 Hyperparathyroidism, unspecified: Secondary | ICD-10-CM | POA: Diagnosis present

## 2022-10-10 DIAGNOSIS — I10 Essential (primary) hypertension: Secondary | ICD-10-CM | POA: Diagnosis present

## 2022-10-10 DIAGNOSIS — M4322 Fusion of spine, cervical region: Secondary | ICD-10-CM | POA: Diagnosis not present

## 2022-10-10 DIAGNOSIS — E785 Hyperlipidemia, unspecified: Secondary | ICD-10-CM | POA: Diagnosis not present

## 2022-10-10 DIAGNOSIS — N400 Enlarged prostate without lower urinary tract symptoms: Secondary | ICD-10-CM | POA: Diagnosis present

## 2022-10-10 DIAGNOSIS — Z880 Allergy status to penicillin: Secondary | ICD-10-CM

## 2022-10-10 DIAGNOSIS — Z981 Arthrodesis status: Secondary | ICD-10-CM

## 2022-10-10 HISTORY — PX: POSTERIOR CERVICAL FUSION/FORAMINOTOMY: SHX5038

## 2022-10-10 HISTORY — DX: Myoneural disorder, unspecified: G70.9

## 2022-10-10 LAB — CBC
HCT: 37.3 % — ABNORMAL LOW (ref 39.0–52.0)
Hemoglobin: 11.6 g/dL — ABNORMAL LOW (ref 13.0–17.0)
MCH: 24.7 pg — ABNORMAL LOW (ref 26.0–34.0)
MCHC: 31.1 g/dL (ref 30.0–36.0)
MCV: 79.5 fL — ABNORMAL LOW (ref 80.0–100.0)
Platelets: 223 10*3/uL (ref 150–400)
RBC: 4.69 MIL/uL (ref 4.22–5.81)
RDW: 15.4 % (ref 11.5–15.5)
WBC: 5.9 10*3/uL (ref 4.0–10.5)
nRBC: 0 % (ref 0.0–0.2)

## 2022-10-10 LAB — BASIC METABOLIC PANEL
Anion gap: 8 (ref 5–15)
BUN: 18 mg/dL (ref 8–23)
CO2: 23 mmol/L (ref 22–32)
Calcium: 9 mg/dL (ref 8.9–10.3)
Chloride: 107 mmol/L (ref 98–111)
Creatinine, Ser: 1.45 mg/dL — ABNORMAL HIGH (ref 0.61–1.24)
GFR, Estimated: 48 mL/min — ABNORMAL LOW (ref >60)
Glucose, Bld: 146 mg/dL — ABNORMAL HIGH (ref 70–99)
Potassium: 4 mmol/L (ref 3.5–5.1)
Sodium: 138 mmol/L (ref 135–145)

## 2022-10-10 LAB — SURGICAL PCR SCREEN
MRSA, PCR: NEGATIVE
Staphylococcus aureus: NEGATIVE

## 2022-10-10 LAB — TYPE AND SCREEN
ABO/RH(D): O POS
Antibody Screen: NEGATIVE

## 2022-10-10 LAB — GLUCOSE, CAPILLARY
Glucose-Capillary: 143 mg/dL — ABNORMAL HIGH (ref 70–99)
Glucose-Capillary: 159 mg/dL — ABNORMAL HIGH (ref 70–99)
Glucose-Capillary: 157 mg/dL — ABNORMAL HIGH (ref 70–99)
Glucose-Capillary: 232 mg/dL — ABNORMAL HIGH (ref 70–99)

## 2022-10-10 SURGERY — POSTERIOR CERVICAL FUSION/FORAMINOTOMY LEVEL 1
Anesthesia: General

## 2022-10-10 MED ORDER — OXYCODONE-ACETAMINOPHEN 5-325 MG PO TABS
ORAL_TABLET | ORAL | Status: AC
  Filled 2022-10-10: qty 2

## 2022-10-10 MED ORDER — ONDANSETRON HCL 4 MG/2ML IJ SOLN
INTRAMUSCULAR | Status: AC
  Filled 2022-10-10: qty 4

## 2022-10-10 MED ORDER — KETOROLAC TROMETHAMINE 15 MG/ML IJ SOLN
7.5000 mg | Freq: Four times a day (QID) | INTRAMUSCULAR | Status: DC
  Administered 2022-10-10 – 2022-10-11 (×3): 7.5 mg via INTRAVENOUS
  Filled 2022-10-10 (×4): qty 1

## 2022-10-10 MED ORDER — TAMSULOSIN HCL 0.4 MG PO CAPS
0.4000 mg | ORAL_CAPSULE | Freq: Every day | ORAL | Status: DC
  Administered 2022-10-10: 0.4 mg via ORAL
  Filled 2022-10-10: qty 1

## 2022-10-10 MED ORDER — ACETAMINOPHEN 650 MG RE SUPP: 650.0000 mg | RECTAL | Status: DC | PRN

## 2022-10-10 MED ORDER — 0.9 % SODIUM CHLORIDE (POUR BTL) OPTIME
TOPICAL | Status: DC | PRN
  Administered 2022-10-10 (×2): 1000 mL

## 2022-10-10 MED ORDER — DEXAMETHASONE SODIUM PHOSPHATE 10 MG/ML IJ SOLN
INTRAMUSCULAR | Status: AC
  Filled 2022-10-10: qty 2

## 2022-10-10 MED ORDER — PANTOPRAZOLE SODIUM 40 MG PO TBEC
40.0000 mg | DELAYED_RELEASE_TABLET | Freq: Every day | ORAL | Status: DC
  Administered 2022-10-11: 40 mg via ORAL
  Filled 2022-10-10: qty 1

## 2022-10-10 MED ORDER — MENTHOL 3 MG MT LOZG: 1.0000 | LOZENGE | OROMUCOSAL | Status: DC | PRN

## 2022-10-10 MED ORDER — BACITRACIN ZINC 500 UNIT/GM EX OINT
TOPICAL_OINTMENT | CUTANEOUS | Status: DC | PRN
  Administered 2022-10-10: 1 via TOPICAL

## 2022-10-10 MED ORDER — AMITRIPTYLINE HCL 50 MG PO TABS
50.0000 mg | ORAL_TABLET | Freq: Every day | ORAL | Status: DC
  Filled 2022-10-10 (×2): qty 1

## 2022-10-10 MED ORDER — CHLORHEXIDINE GLUCONATE 0.12 % MT SOLN
15.0000 mL | Freq: Once | OROMUCOSAL | Status: AC
  Administered 2022-10-10: 15 mL via OROMUCOSAL
  Filled 2022-10-10: qty 15

## 2022-10-10 MED ORDER — PHENOL 1.4 % MT LIQD: 1.0000 | OROMUCOSAL | Status: DC | PRN

## 2022-10-10 MED ORDER — CHLORHEXIDINE GLUCONATE CLOTH 2 % EX PADS: 6.0000 | MEDICATED_PAD | Freq: Once | CUTANEOUS | Status: DC

## 2022-10-10 MED ORDER — LIDOCAINE-EPINEPHRINE 1 %-1:100000 IJ SOLN
INTRAMUSCULAR | Status: AC
  Filled 2022-10-10: qty 1

## 2022-10-10 MED ORDER — DOCUSATE SODIUM 100 MG PO CAPS
100.0000 mg | ORAL_CAPSULE | Freq: Two times a day (BID) | ORAL | Status: DC
  Administered 2022-10-10 – 2022-10-11 (×2): 100 mg via ORAL
  Filled 2022-10-10 (×3): qty 1

## 2022-10-10 MED ORDER — SENNA 8.6 MG PO TABS
1.0000 | ORAL_TABLET | Freq: Two times a day (BID) | ORAL | Status: DC
  Administered 2022-10-10 – 2022-10-11 (×2): 8.6 mg via ORAL
  Filled 2022-10-10 (×3): qty 1

## 2022-10-10 MED ORDER — ROSUVASTATIN CALCIUM 5 MG PO TABS
5.0000 mg | ORAL_TABLET | ORAL | Status: DC
  Administered 2022-10-11: 5 mg via ORAL
  Filled 2022-10-10: qty 1

## 2022-10-10 MED ORDER — HYDROCHLOROTHIAZIDE 12.5 MG PO TABS
12.5000 mg | ORAL_TABLET | Freq: Every day | ORAL | Status: DC
  Administered 2022-10-11: 12.5 mg via ORAL
  Filled 2022-10-10: qty 1

## 2022-10-10 MED ORDER — PHENYLEPHRINE HCL-NACL 20-0.9 MG/250ML-% IV SOLN
INTRAVENOUS | Status: DC | PRN
  Administered 2022-10-10: 45 ug/min via INTRAVENOUS

## 2022-10-10 MED ORDER — SODIUM CHLORIDE 0.9% FLUSH: 3.0000 mL | INTRAVENOUS | Status: DC | PRN

## 2022-10-10 MED ORDER — IRBESARTAN 150 MG PO TABS
150.0000 mg | ORAL_TABLET | Freq: Every day | ORAL | Status: DC
  Administered 2022-10-11: 150 mg via ORAL
  Filled 2022-10-10: qty 1

## 2022-10-10 MED ORDER — ONDANSETRON HCL 4 MG/2ML IJ SOLN: 4.0000 mg | Freq: Four times a day (QID) | INTRAMUSCULAR | Status: DC | PRN

## 2022-10-10 MED ORDER — LACTATED RINGERS IV SOLN: INTRAVENOUS | Status: DC | PRN

## 2022-10-10 MED ORDER — ALUM & MAG HYDROXIDE-SIMETH 200-200-20 MG/5ML PO SUSP: 30.0000 mL | Freq: Four times a day (QID) | ORAL | Status: DC | PRN

## 2022-10-10 MED ORDER — FENTANYL CITRATE (PF) 100 MCG/2ML IJ SOLN
INTRAMUSCULAR | Status: AC
  Filled 2022-10-10: qty 2

## 2022-10-10 MED ORDER — LIDOCAINE 2% (20 MG/ML) 5 ML SYRINGE
INTRAMUSCULAR | Status: DC | PRN
  Administered 2022-10-10: 40 mg via INTRAVENOUS

## 2022-10-10 MED ORDER — THROMBIN 5000 UNITS EX SOLR
CUTANEOUS | Status: AC
  Filled 2022-10-10: qty 5000

## 2022-10-10 MED ORDER — METHOCARBAMOL 1000 MG/10ML IJ SOLN: 500.0000 mg | Freq: Four times a day (QID) | INTRAVENOUS | Status: DC | PRN

## 2022-10-10 MED ORDER — FENTANYL CITRATE (PF) 250 MCG/5ML IJ SOLN
INTRAMUSCULAR | Status: DC | PRN
  Administered 2022-10-10: 100 ug via INTRAVENOUS
  Administered 2022-10-10 (×3): 50 ug via INTRAVENOUS

## 2022-10-10 MED ORDER — LIDOCAINE-EPINEPHRINE 1 %-1:100000 IJ SOLN
INTRAMUSCULAR | Status: DC | PRN
  Administered 2022-10-10: 5 mL

## 2022-10-10 MED ORDER — SUGAMMADEX SODIUM 200 MG/2ML IV SOLN
INTRAVENOUS | Status: DC | PRN
  Administered 2022-10-10: 200 mg via INTRAVENOUS

## 2022-10-10 MED ORDER — OXYCODONE-ACETAMINOPHEN 5-325 MG PO TABS
1.0000 | ORAL_TABLET | ORAL | Status: DC | PRN
  Administered 2022-10-10 – 2022-10-11 (×3): 2 via ORAL
  Filled 2022-10-10 (×2): qty 2

## 2022-10-10 MED ORDER — BACITRACIN ZINC 500 UNIT/GM EX OINT
TOPICAL_OINTMENT | CUTANEOUS | Status: AC
  Filled 2022-10-10: qty 28.35

## 2022-10-10 MED ORDER — ROCURONIUM BROMIDE 10 MG/ML (PF) SYRINGE
PREFILLED_SYRINGE | INTRAVENOUS | Status: AC
  Filled 2022-10-10: qty 10

## 2022-10-10 MED ORDER — PROPOFOL 10 MG/ML IV BOLUS
INTRAVENOUS | Status: DC | PRN
  Administered 2022-10-10: 130 mg via INTRAVENOUS

## 2022-10-10 MED ORDER — POLYETHYLENE GLYCOL 3350 17 G PO PACK: 17.0000 g | PACK | Freq: Every day | ORAL | Status: DC | PRN

## 2022-10-10 MED ORDER — IRBESARTAN-HYDROCHLOROTHIAZIDE 150-12.5 MG PO TABS: 1.0000 | ORAL_TABLET | Freq: Every day | ORAL | Status: DC

## 2022-10-10 MED ORDER — ORAL CARE MOUTH RINSE: 15.0000 mL | Freq: Once | OROMUCOSAL | Status: AC

## 2022-10-10 MED ORDER — OXYCODONE-ACETAMINOPHEN 5-325 MG PO TABS
1.0000 | ORAL_TABLET | Freq: Four times a day (QID) | ORAL | Status: DC | PRN
  Administered 2022-10-10: 2 via ORAL
  Filled 2022-10-10: qty 2

## 2022-10-10 MED ORDER — THROMBIN 5000 UNITS EX SOLR
OROMUCOSAL | Status: DC | PRN
  Administered 2022-10-10 (×2): 5 mL via TOPICAL

## 2022-10-10 MED ORDER — INSULIN ASPART 100 UNIT/ML IJ SOLN
0.0000 [IU] | Freq: Every day | INTRAMUSCULAR | Status: DC
  Administered 2022-10-10: 2 [IU] via SUBCUTANEOUS

## 2022-10-10 MED ORDER — POLYETHYLENE GLYCOL 3350 17 G PO PACK
17.0000 g | PACK | Freq: Every day | ORAL | Status: DC | PRN
  Administered 2022-10-11: 17 g via ORAL
  Filled 2022-10-10: qty 1

## 2022-10-10 MED ORDER — VANCOMYCIN HCL IN DEXTROSE 1-5 GM/200ML-% IV SOLN
1000.0000 mg | Freq: Once | INTRAVENOUS | Status: AC
  Administered 2022-10-10: 1000 mg via INTRAVENOUS
  Filled 2022-10-10: qty 200

## 2022-10-10 MED ORDER — EPHEDRINE 5 MG/ML INJ
INTRAVENOUS | Status: AC
  Filled 2022-10-10: qty 5

## 2022-10-10 MED ORDER — DEXMEDETOMIDINE HCL IN NACL 80 MCG/20ML IV SOLN
INTRAVENOUS | Status: DC | PRN
  Administered 2022-10-10 (×2): 8 ug via BUCCAL

## 2022-10-10 MED ORDER — INSULIN ASPART 100 UNIT/ML IJ SOLN: 0.0000 [IU] | INTRAMUSCULAR | Status: DC | PRN

## 2022-10-10 MED ORDER — INSULIN ASPART 100 UNIT/ML IJ SOLN
0.0000 [IU] | Freq: Three times a day (TID) | INTRAMUSCULAR | Status: DC
  Administered 2022-10-11: 3 [IU] via SUBCUTANEOUS

## 2022-10-10 MED ORDER — SODIUM CHLORIDE 0.9% FLUSH
3.0000 mL | Freq: Two times a day (BID) | INTRAVENOUS | Status: DC
  Administered 2022-10-10 (×2): 3 mL via INTRAVENOUS

## 2022-10-10 MED ORDER — PHENYLEPHRINE 80 MCG/ML (10ML) SYRINGE FOR IV PUSH (FOR BLOOD PRESSURE SUPPORT)
PREFILLED_SYRINGE | INTRAVENOUS | Status: DC | PRN
  Administered 2022-10-10: 80 ug via INTRAVENOUS

## 2022-10-10 MED ORDER — BUPIVACAINE HCL (PF) 0.5 % IJ SOLN
INTRAMUSCULAR | Status: AC
  Filled 2022-10-10: qty 30

## 2022-10-10 MED ORDER — ACETAMINOPHEN 325 MG PO TABS: 650.0000 mg | ORAL_TABLET | ORAL | Status: DC | PRN

## 2022-10-10 MED ORDER — ONDANSETRON HCL 4 MG/2ML IJ SOLN
INTRAMUSCULAR | Status: DC | PRN
  Administered 2022-10-10: 4 mg via INTRAVENOUS

## 2022-10-10 MED ORDER — LIDOCAINE 2% (20 MG/ML) 5 ML SYRINGE
INTRAMUSCULAR | Status: AC
  Filled 2022-10-10: qty 5

## 2022-10-10 MED ORDER — VITAMIN D 25 MCG (1000 UNIT) PO TABS: 2000.0000 [IU] | ORAL_TABLET | Freq: Every day | ORAL | Status: DC

## 2022-10-10 MED ORDER — GABAPENTIN 300 MG PO CAPS
300.0000 mg | ORAL_CAPSULE | Freq: Every day | ORAL | Status: DC
  Administered 2022-10-10 – 2022-10-11 (×2): 300 mg via ORAL
  Filled 2022-10-10 (×2): qty 1

## 2022-10-10 MED ORDER — BISACODYL 10 MG RE SUPP: 10.0000 mg | Freq: Every day | RECTAL | Status: DC | PRN

## 2022-10-10 MED ORDER — DEXAMETHASONE SODIUM PHOSPHATE 10 MG/ML IJ SOLN
INTRAMUSCULAR | Status: DC | PRN
  Administered 2022-10-10: 5 mg via INTRAVENOUS

## 2022-10-10 MED ORDER — METFORMIN HCL 500 MG PO TABS
500.0000 mg | ORAL_TABLET | Freq: Two times a day (BID) | ORAL | Status: DC
  Administered 2022-10-11: 500 mg via ORAL
  Filled 2022-10-10: qty 1

## 2022-10-10 MED ORDER — ONDANSETRON HCL 4 MG PO TABS: 4.0000 mg | ORAL_TABLET | Freq: Four times a day (QID) | ORAL | Status: DC | PRN

## 2022-10-10 MED ORDER — VANCOMYCIN HCL IN DEXTROSE 1-5 GM/200ML-% IV SOLN
1000.0000 mg | INTRAVENOUS | Status: AC
  Administered 2022-10-10: 1000 mg via INTRAVENOUS
  Filled 2022-10-10: qty 200

## 2022-10-10 MED ORDER — FLEET ENEMA 7-19 GM/118ML RE ENEM: 1.0000 | ENEMA | Freq: Once | RECTAL | Status: DC | PRN

## 2022-10-10 MED ORDER — LACTATED RINGERS IV SOLN: INTRAVENOUS | Status: DC

## 2022-10-10 MED ORDER — BUPIVACAINE HCL 0.5 % IJ SOLN
INTRAMUSCULAR | Status: DC | PRN
  Administered 2022-10-10: 5 mL
  Administered 2022-10-10: 10 mL

## 2022-10-10 MED ORDER — METHOCARBAMOL 500 MG PO TABS
500.0000 mg | ORAL_TABLET | Freq: Four times a day (QID) | ORAL | Status: DC | PRN
  Administered 2022-10-10 – 2022-10-11 (×2): 500 mg via ORAL
  Filled 2022-10-10 (×2): qty 1

## 2022-10-10 MED ORDER — SODIUM CHLORIDE 0.9 % IV SOLN
250.0000 mL | INTRAVENOUS | Status: DC
  Administered 2022-10-10: 250 mL via INTRAVENOUS

## 2022-10-10 MED ORDER — FENTANYL CITRATE (PF) 250 MCG/5ML IJ SOLN
INTRAMUSCULAR | Status: AC
  Filled 2022-10-10: qty 5

## 2022-10-10 MED ORDER — CHLORHEXIDINE GLUCONATE CLOTH 2 % EX PADS
6.0000 | MEDICATED_PAD | Freq: Once | CUTANEOUS | Status: AC
  Administered 2022-10-10: 6 via TOPICAL

## 2022-10-10 MED ORDER — FENTANYL CITRATE (PF) 100 MCG/2ML IJ SOLN
25.0000 ug | INTRAMUSCULAR | Status: DC | PRN
  Administered 2022-10-10 (×2): 50 ug via INTRAVENOUS

## 2022-10-10 MED ORDER — MORPHINE SULFATE (PF) 2 MG/ML IV SOLN
2.0000 mg | INTRAVENOUS | Status: DC | PRN
  Administered 2022-10-10: 2 mg via INTRAVENOUS
  Filled 2022-10-10: qty 1

## 2022-10-10 MED ORDER — ROCURONIUM BROMIDE 10 MG/ML (PF) SYRINGE
PREFILLED_SYRINGE | INTRAVENOUS | Status: DC | PRN
  Administered 2022-10-10: 30 mg via INTRAVENOUS
  Administered 2022-10-10: 70 mg via INTRAVENOUS

## 2022-10-10 MED ORDER — EPHEDRINE SULFATE-NACL 50-0.9 MG/10ML-% IV SOSY
PREFILLED_SYRINGE | INTRAVENOUS | Status: DC | PRN
  Administered 2022-10-10 (×2): 5 mg via INTRAVENOUS

## 2022-10-10 SURGICAL SUPPLY — 69 items
ADH SKN CLS APL DERMABOND .7 (GAUZE/BANDAGES/DRESSINGS)
ADH SKN CLS LQ APL DERMABOND (GAUZE/BANDAGES/DRESSINGS) ×1
APL SKNCLS STERI-STRIP NONHPOA (GAUZE/BANDAGES/DRESSINGS) ×1
BAG COUNTER SPONGE SURGICOUNT (BAG) ×2 IMPLANT
BAG SPNG CNTER NS LX DISP (BAG) ×2
BAND INSRT 18 STRL LF DISP RB (MISCELLANEOUS)
BAND RUBBER #18 3X1/16 STRL (MISCELLANEOUS) IMPLANT
BENZOIN TINCTURE PRP APPL 2/3 (GAUZE/BANDAGES/DRESSINGS) IMPLANT
BIT DRILL NEURO 2X3.1 SFT TUCH (MISCELLANEOUS) ×2 IMPLANT
BIT DRILL NS LF DISP RELINE C (BIT) IMPLANT
BIT DRILL RELINE C (BIT) ×1
BIT DRL NS LF DISP RELINE C (BIT) ×1
BLADE CLIPPER SURG (BLADE) IMPLANT
BLADE ULTRA TIP 2M (BLADE) IMPLANT
BONE MATRIX OSTEOCEL PRO LRG (Bone Implant) IMPLANT
CANISTER SUCT 3000ML PPV (MISCELLANEOUS) ×2 IMPLANT
DERMABOND ADVANCED .7 DNX12 (GAUZE/BANDAGES/DRESSINGS) IMPLANT
DERMABOND ADVANCED .7 DNX6 (GAUZE/BANDAGES/DRESSINGS) IMPLANT
DEVICE DISSECT PLASMABLAD 3.0S (MISCELLANEOUS) ×2 IMPLANT
DRAPE C-ARM 42X72 X-RAY (DRAPES) ×4 IMPLANT
DRAPE LAPAROTOMY 100X72 PEDS (DRAPES) ×2 IMPLANT
DRAPE MICROSCOPE SLANT 54X150 (MISCELLANEOUS) IMPLANT
DRILL NEURO 2X3.1 SOFT TOUCH (MISCELLANEOUS) ×1
DURAPREP 26ML APPLICATOR (WOUND CARE) ×2 IMPLANT
ELECT REM PT RETURN 9FT ADLT (ELECTROSURGICAL) ×1
ELECTRODE REM PT RTRN 9FT ADLT (ELECTROSURGICAL) ×2 IMPLANT
GAUZE 4X4 16PLY ~~LOC~~+RFID DBL (SPONGE) IMPLANT
GAUZE SPONGE 4X4 12PLY STRL (GAUZE/BANDAGES/DRESSINGS) IMPLANT
GLOVE BIOGEL PI IND STRL 8.5 (GLOVE) ×2 IMPLANT
GLOVE ECLIPSE 8.5 STRL (GLOVE) ×2 IMPLANT
GLOVE EXAM NITRILE XL STR (GLOVE) IMPLANT
GOWN STRL REUS W/ TWL LRG LVL3 (GOWN DISPOSABLE) IMPLANT
GOWN STRL REUS W/ TWL XL LVL3 (GOWN DISPOSABLE) ×2 IMPLANT
GOWN STRL REUS W/TWL 2XL LVL3 (GOWN DISPOSABLE) ×2 IMPLANT
GOWN STRL REUS W/TWL LRG LVL3 (GOWN DISPOSABLE)
GOWN STRL REUS W/TWL XL LVL3 (GOWN DISPOSABLE) ×2
GRAFT BONE PROTEIOS LRG 5CC (Orthopedic Implant) IMPLANT
HEMOSTAT POWDER KIT SURGIFOAM (HEMOSTASIS) ×2 IMPLANT
HEMOSTAT SURGICEL 2X14 (HEMOSTASIS) IMPLANT
KIT BASIN OR (CUSTOM PROCEDURE TRAY) ×2 IMPLANT
KIT TURNOVER KIT B (KITS) ×2 IMPLANT
NDL SPNL 18GX3.5 QUINCKE PK (NEEDLE) IMPLANT
NEEDLE HYPO 22GX1.5 SAFETY (NEEDLE) ×2 IMPLANT
NEEDLE SPNL 18GX3.5 QUINCKE PK (NEEDLE) IMPLANT
NS IRRIG 1000ML POUR BTL (IV SOLUTION) ×2 IMPLANT
PACK LAMINECTOMY NEURO (CUSTOM PROCEDURE TRAY) ×2 IMPLANT
PAD ARMBOARD 7.5X6 YLW CONV (MISCELLANEOUS) ×6 IMPLANT
PATTIES SURGICAL .25X.25 (GAUZE/BANDAGES/DRESSINGS) IMPLANT
PIN MAYFIELD SKULL DISP (PIN) ×2 IMPLANT
PLASMABLADE 3.0S (MISCELLANEOUS) ×1
ROD RELINE C PB 3.5X30 (Rod) IMPLANT
SCREW LOCK RELINE C OPEN (Screw) IMPLANT
SCREW MA RELINE C 3.5X16 (Screw) ×4 IMPLANT
SCREW SP MA RELINE-C 3.5X16 (Screw) IMPLANT
SPIKE FLUID TRANSFER (MISCELLANEOUS) ×2 IMPLANT
SPONGE T-LAP 4X18 ~~LOC~~+RFID (SPONGE) IMPLANT
STAPLER SKIN PROX WIDE 3.9 (STAPLE) ×2 IMPLANT
STRIP CLOSURE SKIN 1/2X4 (GAUZE/BANDAGES/DRESSINGS) IMPLANT
SUT ETHILON 3 0 FSL (SUTURE) IMPLANT
SUT VIC AB 0 CT1 18XCR BRD8 (SUTURE) ×2 IMPLANT
SUT VIC AB 0 CT1 8-18 (SUTURE) ×1
SUT VIC AB 2-0 CP2 18 (SUTURE) ×2 IMPLANT
SUT VIC AB 3-0 SH 8-18 (SUTURE) IMPLANT
SUT VIC AB 4-0 RB1 18 (SUTURE) ×2 IMPLANT
TAPE PAPER 3X10 WHT MICROPORE (GAUZE/BANDAGES/DRESSINGS) IMPLANT
TOWEL GREEN STERILE (TOWEL DISPOSABLE) ×2 IMPLANT
TOWEL GREEN STERILE FF (TOWEL DISPOSABLE) ×2 IMPLANT
TRAY FOLEY MTR SLVR 16FR STAT (SET/KITS/TRAYS/PACK) IMPLANT
WATER STERILE IRR 1000ML POUR (IV SOLUTION) ×2 IMPLANT

## 2022-10-10 NOTE — H&P (Signed)
Seth Schwartz is an 87 y.o. male.   Chief Complaint: Neck shoulder and arm pain, pseudoarthrosis C6-C7 HPI: Patient is an 87 year old individual whose had extensive cervical spondylitic disease in the past.  He had been doing reasonably well except in the last 6 months has developed progressively worsening neck pain workup of the cervical spine demonstrates that he has evidence of an arthrodesis from C3 down to C7 at C6-C7 however there is evidence that there is some motion and there is a pseudoarthrosis has been some bony foraminal stenosis in the lateral recesses at this level also this may be creating some early cervical radicular symptoms.  The patient's main complaint is that of neck shoulder and interscapular pain despite efforts at conservative management over the past half year he has had worsening symptoms he has been advised regarding a posterior revision and fusion of the C6-C7 level.  Past Medical History:  Diagnosis Date   Allergic rhinitis    Anxiety    takes Valium prn   Arthritis    spinal DDD, stenosis   Asthma    Chronic low back pain    Complication of anesthesia    [name] AFTER BEING GIVEN " TWILIGHT SLEEP"    Constipation    takes Benefiber and stool softener daily   Depression    takes Luvox daily   Diabetes (HCC)    TYPE 2    Dizziness    LAST SPELL WAS 45 DAYS AGO; REPORTS LASTING 5 MINUTES AND BACK TO HIS NORMAL RIGHT AFTER ; DENIES BLACKOUTS; HAD A CT OF HEAD THAT ONLY  SHOWED A SMALL NODULE THAT IS TO BE MONITORED NOW     ED (erectile dysfunction)    Dr. [name]   Enlarged prostate    takes Uroxatral nightly   GERD (gastroesophageal reflux disease)    TAKES RANITIDINE AND PAPAYA  TO TREAT    Glaucoma    uses eye drops daily   H/O hiatal hernia    History of cataract    History of colon polyps    History of gastric ulcer 1967   History of kidney stones    lithotripsyx1   History of rectal bleeding    History of stress test 2010    told wnl, on treadmill   Hypercalcemia    Hyperlipidemia    takes Cholestoff at night and Fish Oil bid   Hyperparathyroidism ([place])    Hypertension    takes Losartan - takes in a.m. , not referred to cardiac, had stress test 6-7 yrs. ago, told wnl   Insomnia    takes Trazodone nightly   Low testosterone    Dr. [name]   Migraine    r/t cervical issues, occasionally has severe headache & takes NSAID & Benadryl   Multiple nevi    Dr. Radford Pax   Neuromuscular disorder ([place])    Neuropathy    Nocturia    OA (osteoarthritis)    Peyronie disease    Dr. [name]   Pneumonia    as a baby   Skin spots-aging    Urinary frequency    Urinary urgency    Weakness    left leg and both hands    Past Surgical History:  Procedure Laterality Date   ANTERIOR CERVICAL DECOMP/DISCECTOMY FUSION N/A 12/17/2012   Procedure: ANTERIOR CERVICAL DECOMPRESSION/DISCECTOMY FUSION 2 LEVELS;  Surgeon: [name], MD;  Location: [facility];  Service: Neurosurgery;  Laterality: N/A;  Cervical three-four Anterior cervical decompression/diskectomy/fusion, with anterior  revision of Cervical six-seven pseudoarthrosis   ANTERIOR CERVICAL DECOMP/DISCECTOMY FUSION  1998   ANTERIOR CERVICAL DECOMP/DISCECTOMY FUSION N/A 06/02/2014   Procedure: Cervical three-four, Cervical six-seven  Anterior Cervical Decompression/diskectomy/fusion with Iliac crest bone graft;  Surgeon: [name], MD;  Location: [facility];  Service: Neurosurgery;  Laterality: N/A;   ANTERIOR LUMBAR FUSION N/A 04/01/2013   Procedure: ANTERIOR LUMBAR FUSION 1 LEVEL;  Surgeon: [name], MD;  Location: [facility];  Service: Neurosurgery;  Laterality: N/A;  Lumbar five-Sacral One Anterior lumbar interbody fusion   BACK SURGERY  2000/2002/2007   3 lumbars, 1 cervical  with fusion   COLONOSCOPY     ESOPHAGOGASTRODUODENOSCOPY  4/15   esophagus stretched   EYE SURGERY Bilateral 2008   bilateral cataracts removed, 02/2015 & 6/ 2016,  glaucoma has  improved    HEMORRHOID SURGERY  2009   x 2   LITHOTRIPSY     PARATHYROID EXPLORATION N/A 02/15/2018   Procedure: NECK EXPLORATION WITH RIGHT INFERIOR PARATHYROIDECTOMY AND BIOPSY OF LEFT INFERIOR PARATHYROID;  Surgeon: [name], MD;  Location: [facility];  Service: General;  Laterality: N/A;   POSTERIOR CERVICAL FUSION/FORAMINOTOMY N/A 07/26/2020   Procedure: Cervical Two-Three Posterior cervical fusion with lateral mass fixation;  Surgeon: [name], MD;  Location: [facility];  Service: Neurosurgery;  Laterality: N/A;  posterior   TRIGGER FINGER RELEASE Left 4-59yr ago   middle finger   TRIGGER FINGER RELEASE Left     History reviewed. No pertinent family history. Social History:  reports that he quit smoking about 43 years ago. His smoking use included cigarettes. He has a 6.25 pack-year smoking history. He quit smokeless tobacco use about 44 years ago.  His smokeless tobacco use included chew and snuff. He reports that he does not drink alcohol and does not use drugs.  Allergies:  Allergies  Allergen Reactions   Sulfa Antibiotics Shortness Of Breath   Ace Inhibitors Cough   Penicillins Hives    Has patient had a PCN reaction causing immediate rash, facial/tongue/throat swelling, SOB or lightheadedness with hypotension: No Has patient had a PCN reaction causing severe rash involving mucus membranes or skin necrosis: Yes Has patient had a PCN reaction that required hospitalization: No Has patient had a PCN reaction occurring within the last 10 years: No If all of the above answers are "NO", then may proceed with Cephalosporin use.     Medications Prior to Admission  Medication Sig Dispense Refill   acetaminophen (TYLENOL) 650 MG CR tablet Take 650 mg by mouth every 8 (eight) hours as needed for pain.     amitriptyline (ELAVIL) 50 MG tablet Take 50 mg by mouth at bedtime.     B COMPLEX-MINERALS PO Take 1 tablet by mouth daily.     budesonide-formoterol (SYMBICORT) 160-4.5 MCG/ACT  inhaler Inhale 2 puffs into the lungs daily as needed (wheezing or shortness of breath).      Cholecalciferol (VITAMIN D) 50 MCG (2000 UT) tablet Take 2,000 Units by mouth daily.     diazepam (VALIUM) 5 MG tablet Take 1 tablet (5 mg total) by mouth every 6 (six) hours as needed for muscle spasms (PRN for muscle spasms). (Patient taking differently: Take 2.5 mg by mouth at bedtime.) 30 tablet 0   dorzolamide-timolol (COSOPT) 2-0.5 % ophthalmic solution Place 1 drop into both eyes 2 (two) times daily.     fexofenadine (ALLEGRA) 180 MG tablet Take 180 mg by mouth daily.     fluvoxaMINE (LUVOX) 50 MG tablet Take  50 mg by mouth daily.     gabapentin (NEURONTIN) 300 MG capsule Take 300 mg by mouth 5 (five) times daily.     HYDROcodone-acetaminophen (NORCO) 10-325 MG tablet Take 0.5 tablets by mouth 4 (four) times daily as needed for severe pain.     irbesartan-hydrochlorothiazide (AVALIDE) 150-12.5 MG tablet Take 1 tablet by mouth daily.     lidocaine 4 % Place 1 patch onto the skin daily as needed (pain).     metFORMIN (GLUCOPHAGE) 500 MG tablet Take 500 mg by mouth 2 (two) times daily with a meal.      Multiple Vitamin (MULTIVITAMIN WITH MINERALS) TABS tablet Take 1 tablet by mouth daily.     omeprazole (PRILOSEC) 40 MG capsule Take 40 mg by mouth every evening.     polyethylene glycol (MIRALAX / GLYCOLAX) packet Take 17 g by mouth daily as needed for moderate constipation.      rosuvastatin (CRESTOR) 5 MG tablet Take 5 mg by mouth 3 (three) times a week. Mon Wed and Sat     tamsulosin (FLOMAX) 0.4 MG CAPS capsule Take 0.4 mg by mouth at bedtime.     testosterone cypionate (DEPOTESTOTERONE CYPIONATE) 100 MG/ML injection Inject 100 mg into the muscle See admin instructions. Every 10 days     Coenzyme Q10 (EQL COQ10) 300 MG CAPS Take 300 mg by mouth at bedtime.       Results for orders placed or performed during the hospital encounter of 10/10/22 (from the past 48 hour(s))  Glucose, capillary      Status: Abnormal   Collection Time: 10/10/22  8:12 AM  Result Value Ref Range   Glucose-Capillary 143 (H) 70 - 99 mg/dL    Comment: Glucose reference range applies only to samples taken after fasting for at least 8 hours.  Type and screen     Status: None   Collection Time: 10/10/22  8:45 AM  Result Value Ref Range   ABO/RH(D) O POS    Antibody Screen NEG    Sample Expiration      10/13/2022,2359 Performed at [facility], [place] [address], [place], [place] [address]   Basic metabolic panel     Status: Abnormal   Collection Time: 10/10/22 10:03 AM  Result Value Ref Range   Sodium 138 135 - 145 mmol/L   Potassium 4.0 3.5 - 5.1 mmol/L   Chloride 107 98 - 111 mmol/L   CO2 23 22 - 32 mmol/L   Glucose, Bld 146 (H) 70 - 99 mg/dL    Comment: Glucose reference range applies only to samples taken after fasting for at least 8 hours.   BUN 18 8 - 23 mg/dL   Creatinine, Ser 1.45 (H) 0.61 - 1.24 mg/dL   Calcium 9.0 8.9 - 10.3 mg/dL   GFR, Estimated 48 (L) >60 mL/min    Comment: (NOTE) Calculated using the CKD-EPI Creatinine Equation (2021)    Anion gap 8 5 - 15    Comment: Performed at [facility] [address] [place], [place] [address]  CBC     Status: Abnormal   Collection Time: 10/10/22 10:03 AM  Result Value Ref Range   WBC 5.9 4.0 - 10.5 K/uL   RBC 4.69 4.22 - 5.81 MIL/uL   Hemoglobin 11.6 (L) 13.0 - 17.0 g/dL   HCT 37.3 (L) 39.0 - 52.0 %   MCV 79.5 (L) 80.0 - 100.0 fL   MCH 24.7 (L) 26.0 - 34.0 pg   MCHC 31.1  30.0 - 36.0 g/dL   RDW 15.4 11.5 - 15.5 %   Platelets 223 150 - 400 K/uL   nRBC 0.0 0.0 - 0.2 %    Comment: Performed at [facility], [place] [address], [place], [place] [address]  Glucose, capillary     Status: Abnormal   Collection Time: 10/10/22 10:25 AM  Result Value Ref Range   Glucose-Capillary 159 (H) 70 - 99 mg/dL    Comment: Glucose reference range applies only to samples taken after fasting for at least 8 hours.   No results  found.  Review of Systems  Constitutional:  Positive for activity change.  Musculoskeletal:  Positive for neck pain and neck stiffness.  Allergic/Immunologic: Negative.   Neurological:  Positive for weakness and numbness.  Hematological: Negative.   Psychiatric/Behavioral: Negative.    All other systems reviewed and are negative.   Blood pressure 110/74, pulse 75, temperature 98.4 F (36.9 C), temperature source Oral, resp. rate 18, height '5\' 10"'$  (1.778 m), weight 93.4 kg, SpO2 100 %. Physical Exam Constitutional:      Appearance: Normal appearance. He is normal weight.  HENT:     Head: Normocephalic and atraumatic.     Right Ear: Tympanic membrane, ear canal and external ear normal.     Left Ear: Tympanic membrane, ear canal and external ear normal.     Nose: Nose normal.     Mouth/Throat:     Mouth: Mucous membranes are moist.     Pharynx: Oropharynx is clear.  Eyes:     Extraocular Movements: Extraocular movements intact.     Conjunctiva/sclera: Conjunctivae normal.     Pupils: Pupils are equal, round, and reactive to light.  Abdominal:     General: Abdomen is flat. Bowel sounds are normal.     Palpations: Abdomen is soft.  Musculoskeletal:        General: Normal range of motion.  Skin:    General: Skin is warm and dry.  Neurological:     Mental Status: He is alert.     Comments: Cranial nerve examination reveals pupils are 3 mm briskly reactive light and accommodation extraocular movements are full face is symmetric to grimace tongue and uvula in midline sclera conjunctiva are normal.  Function in deltoids reveals 4 out of 5 strength bilaterally biceps triceps and intrinsic strength is 5 out of 5 grip strength is decreased to 4 out of 5 finger extensor strength is decreased to 4 out of 5 worse on the right than on the left.  Tone and bulk in the major musculature is intact reflexes are 1+ in the biceps absent in the triceps 2+ in the brachioradialis 2+ in the patellae 3+ in  the Achilles with 2 beats of clonus.  Station and gait are intact.  Psychiatric:        Mood and Affect: Mood normal.        Behavior: Behavior normal.        Thought Content: Thought content normal.        Judgment: Judgment normal.      Assessment/Plan Pseudoarthrosis C6-C7 with cervical radiculopathy.  Plan: Posterior decompression and arthrodesis C6-C7.  Earleen Newport, MD 10/10/2022, 11:52 AM

## 2022-10-10 NOTE — Op Note (Signed)
Date of surgery: 10/10/2022 Preoperative diagnosis: Pseudoarthrosis C6-C7 with neck pain cervical radiculopathy Postoperative diagnosis: Same Procedure: Posterior cervical decompression and fusion C6-C7.  Arthrodesis with allograft and Proteios.  Facet screw fixation C6-C7. Surgeon: [name] First Assistant: Elwin Sleight, DO Anesthesia: General endotracheal Indications: The patient is an 87 year old individual who has been a longstanding patient with cervical and lumbar spondylitic disease.  He has had a previous anterior fusion of see for C5-C6 and C7.  He had developed instability at the C2-C3 level and he underwent posterior arthrodesis at C2-C3 several years ago.  Patient had been this increasingly having worsening pain and a myelogram done in March of this past year demonstrated that he has a pseudoarthrosis at C6-C7.  He failed conservative efforts and I advised revision of the arthrodesis at the C6-C7 level with a posterior adjunctive arthrodesis.  Procedure: The patient was brought to the operating room supine on the stretcher.  After the smooth induction of general tracheal anesthesia his head was placed in the 3 pin headrest and he was carefully turned to the prone position.  The neck was positioned in a neutral position with the chin slightly tucked.  Radiographs were obtained that identified a neutral position.  The patient then underwent prepping of the back of the neck with alcohol DuraPrep and draped in a sterile fashion Incision was created below the previous incision.  This was taken down to the cervical dorsal fascia and the first spinous process was identified positively at C5.  Then the dissection was carried inferiorly from this area and C6 and C7 were exposed.  By testing using a towel clip on each of the spinous processes it was clear that there was motion between the C6 and C7 vertebrae.  Then small laminotomies were created on either side there was noted to be a slight overgrowth  of the superior articular process of C7 into the foramen at C6-C7.  This could be maintained open by slight amount of distraction in the interspinous space.  The facets were then decorticated and screw entry sites were chosen at C6 and C7 using the typical landmarks.  34m screws were chosen and C6 and C7 and 3.5 x 16 mm screws were then placed through the cyst at C6 and C7 the facet at C6-7 was completely decorticated bilaterally.  Once the screws were placed a 30 mm standard contour NuVasive rod was placed between the screws with slight distraction in the interspinous space.  This was locked into position.  With the areas being decorticated and the lamina interlaminar space and into the facets autograft and allograft was packed into this area along with Prody's stent to some Osteocel allograft.  This was packed into both lateral gutters and into the interlaminar space.  Once the packing was completed hemostasis in the soft tissues was checked final radiographs were obtained in AP and lateral projection and the hardware appeared to be in good position the fixation was noted to be quite solid the system was torqued down into its final position also and the torque was checked twice.  With this the retractors were removed and the cervical dorsal fascia was closed with #1 Vicryl in interrupted fashion 2-0 Vicryl was used in the subcutaneous tissues 3-0 Vicryl subcuticularly and staples in the skin dry sterile dressing was applied to the back of the neck 20 cc of half percent Marcaine was injected into the paraspinous fascia and tissues.  The patient was then returned to the supine position by being  removed from the 3 pin headrest and turned supine a soft cervical collar will be applied in the postoperative care area.  Blood loss is estimated at 100 cc.

## 2022-10-10 NOTE — Progress Notes (Signed)
Pharmacy Antibiotic Note  Seth Schwartz is a 87 y.o. male admitted on 10/10/2022 with surgical prophylaxis.  Pharmacy has been consulted for Vancomycin dosing. No drain in place.  Pre-op Vanc 1gm given ~0930  Plan: Vancomycin 1gm IV q12h x 1 dose Pharmacy will sign off - please reconsult if needed  Height: '5\' 10"'$  (177.8 cm) Weight: 93.4 kg (206 lb) IBW/kg (Calculated) : 73  Temp (24hrs), Avg:97.8 F (36.6 C), Min:97 F (36.1 C), Max:98.4 F (36.9 C)  Recent Labs  Lab 10/10/22 1003  WBC 5.9  CREATININE 1.45*    Estimated Creatinine Clearance: 43.6 mL/min (A) (by C-G formula based on SCr of 1.45 mg/dL (H)).    Allergies  Allergen Reactions   Sulfa Antibiotics Shortness Of Breath   Ace Inhibitors Cough   Penicillins Hives    Has patient had a PCN reaction causing immediate rash, facial/tongue/throat swelling, SOB or lightheadedness with hypotension: No Has patient had a PCN reaction causing severe rash involving mucus membranes or skin necrosis: Yes Has patient had a PCN reaction that required hospitalization: No Has patient had a PCN reaction occurring within the last 10 years: No If all of the above answers are "NO", then may proceed with Cephalosporin use.      Thank you for allowing pharmacy to be a part of this patient's care.  Sherlon Handing, PharmD, BCPS Please see amion for complete clinical pharmacist phone list 10/10/2022 4:43 PM

## 2022-10-10 NOTE — Anesthesia Postprocedure Evaluation (Signed)
Anesthesia Post Note  Patient: Seth Schwartz  Procedure(s) Performed: Cervical six-seven Posterior cervical fusion with lateral mass fixation     Patient location during evaluation: PACU Anesthesia Type: General Level of consciousness: awake and alert Pain management: pain level controlled Vital Signs Assessment: post-procedure vital signs reviewed and stable Respiratory status: spontaneous breathing, nonlabored ventilation, respiratory function stable and patient connected to nasal cannula oxygen Cardiovascular status: blood pressure returned to baseline and stable Postop Assessment: no apparent nausea or vomiting Anesthetic complications: no   No notable events documented.  Last Vitals:  Vitals:   10/10/22 1600 10/10/22 1629  BP: 128/78 (!) 149/89  Pulse: 73 85  Resp: 13 19  Temp:  36.7 C  SpO2: 95% 98%                [name]

## 2022-10-10 NOTE — Anesthesia Preprocedure Evaluation (Addendum)
Anesthesia Evaluation  Patient identified by MRN, date of birth, ID band Patient awake    Reviewed: Allergy & Precautions, NPO status , Patient's Chart, lab work & pertinent test results  Airway Mallampati: I  TM Distance: >3 FB Neck ROM: Limited    Dental  (+) Edentulous Upper, Dental Advisory Given   Pulmonary asthma , former smoker   breath sounds clear to auscultation       Cardiovascular hypertension,  Rhythm:Regular Rate:Normal     Neuro/Psych  Headaches PSYCHIATRIC DISORDERS Anxiety Depression       GI/Hepatic Neg liver ROS, hiatal hernia,GERD  ,,  Endo/Other  diabetes    Renal/GU negative Renal ROS     Musculoskeletal  (+) Arthritis ,    Abdominal   Peds  Hematology negative hematology ROS (+)   Anesthesia Other Findings   Reproductive/Obstetrics                             Anesthesia Physical Anesthesia Plan  ASA: 3  Anesthesia Plan: General   Post-op Pain Management: Ofirmev IV (intra-op)*   Induction: Intravenous  PONV Risk Score and Plan: 3 and Ondansetron, Dexamethasone and Midazolam  Airway Management Planned: Video Laryngoscope Planned and Oral ETT  Additional Equipment: None  Intra-op Plan:   Post-operative Plan: Extubation in OR  Informed Consent: I have reviewed the patients History and Physical, chart, labs and discussed the procedure including the risks, benefits and alternatives for the proposed anesthesia with the patient or authorized representative who has indicated his/her understanding and acceptance.     Dental advisory given  Plan Discussed with: CRNA  Anesthesia Plan Comments:        Anesthesia Quick Evaluation

## 2022-10-10 NOTE — Transfer of Care (Signed)
Immediate Anesthesia Transfer of Care Note  Patient: Seth Schwartz  Procedure(s) Performed: Cervical six-seven Posterior cervical fusion with lateral mass fixation  Patient Location: PACU  Anesthesia Type:General  Level of Consciousness: drowsy and patient cooperative  Airway & Oxygen Therapy: Patient Spontanous Breathing  Post-op Assessment: Report given to RN, Post -op Vital signs reviewed and stable, and Patient moving all extremities X 4  Post vital signs: Reviewed and stable  Last Vitals:  Vitals Value Taken Time  BP 142/71 10/10/22 1440  Temp 36.1 C 10/10/22 1440  Pulse 79 10/10/22 1445  Resp 18 10/10/22 1445  SpO2 98 % 10/10/22 1445  Vitals shown include unvalidated device data.  Last Pain:  Vitals:   10/10/22 0905  TempSrc:   PainSc: 4       Patients Stated Pain Goal: 2 (48/84/57 3344)  Complications: No notable events documented.

## 2022-10-10 NOTE — Anesthesia Procedure Notes (Signed)
Procedure Name: Intubation Date/Time: 10/10/2022 12:28 PM  Performed by: Darletta Moll, CRNAPre-anesthesia Checklist: Patient identified, Emergency Drugs available, Suction available and Patient being monitored Patient Re-evaluated:Patient Re-evaluated prior to induction Oxygen Delivery Method: Circle system utilized Preoxygenation: Pre-oxygenation with 100% oxygen Induction Type: IV induction Ventilation: Oral airway inserted - appropriate to patient size and Two handed mask ventilation required Laryngoscope Size: Glidescope and 4 Grade View: Grade I Tube type: Oral Tube size: 7.5 mm Number of attempts: 1 Airway Equipment and Method: Rigid stylet and Video-laryngoscopy Placement Confirmation: ETT inserted through vocal cords under direct vision, positive ETCO2 and breath sounds checked- equal and bilateral Secured at: 23 cm Tube secured with: Tape Dental Injury: Teeth and Oropharynx as per pre-operative assessment

## 2022-10-10 NOTE — Progress Notes (Signed)
Orthopedic Tech Progress Note Patient Details:  ILIYA SPIVACK 16-May-1936 [id number]  PACU RN called requesting a soft collar    Ortho Devices Type of Ortho Device: Soft collar Ortho Device/Splint Location: NECK Ortho Device/Splint Interventions: Ordered   Post Interventions Patient Tolerated: Well Instructions Provided: Care of device  Janit Pagan 10/10/2022, 3:21 PM

## 2022-10-11 LAB — GLUCOSE, CAPILLARY: Glucose-Capillary: 155 mg/dL — ABNORMAL HIGH (ref 70–99)

## 2022-10-11 MED ORDER — OXYCODONE-ACETAMINOPHEN 5-325 MG PO TABS: 1.0000 | ORAL_TABLET | ORAL | 0 refills | Status: AC | PRN

## 2022-10-11 MED FILL — Thrombin For Soln 5000 Unit: CUTANEOUS | Qty: 5000 | Status: AC

## 2022-10-11 NOTE — Progress Notes (Signed)
PT Cancellation Note and Discharge  Patient Details Name: Seth Schwartz MRN: [id number] DOB: 1936/08/26   Cancelled Treatment:    Reason Eval/Treat Not Completed: PT screened, no needs identified, will sign off. Discussed pt case with OT who reports pt is currently mobilizing without assistance and does not require a formal PT evaluation at this time. PT signing off. If needs change, please reconsult.     Thelma Comp 10/11/2022, 9:20 AM  Rolinda Roan, PT, DPT Acute Rehabilitation Services Secure Chat Preferred Office: [phone]

## 2022-10-11 NOTE — Discharge Instructions (Signed)
Wound Care CHANGE DRESSING AS NEEDED Leave incision open to air, when drainage stop You may shower. Do not scrub directly on incision.  Do not put any creams, lotions, or ointments on incision. Activity Walk each and every day, increasing distance each day. No lifting greater than 5 lbs.  Avoid excessive neck motion. No driving for 2 weeks; may ride as a passenger locally. Wear neck brace at all times except when showering.  If provided soft collar, may wear for comfort unless otherwise instructed. Diet Resume your normal diet.   Call Your Doctor If Any of These Occur Redness, drainage, or swelling at the wound.  Temperature greater than 101 degrees. Severe pain not relieved by pain medication. Increased difficulty swallowing. Incision starts to come apart. Follow Up Appt Call [phone]) for problems.  If you have any hardware placed in your spine, you will need an x-ray before your appointment.

## 2022-10-11 NOTE — Evaluation (Signed)
Occupational Therapy Evaluation Patient Details Name: Seth Schwartz MRN: [id number] DOB: [birth year] Today's Date: 10/11/2022   History of Present Illness Seth Schwartz is a 87 yo male who underwentCervical six-seven Posterior cervical fusion with lateral mass fixation on 1/2. PMHx: multiple back surgeries, anxiety, depression, GERD, glaucoma, kidney stones, HTN, HCC< OA, HLD, hypercalcemia   Clinical Impression   Seth Schwartz was evaluated s/p the above cervical surgery, he is typically indep at baseline and lives with his wife who can assist at d/c. Upon evaluation pt was limited by expected cervical pain, decreased activity tolerance and knowledge of precautions. Overall he required min G - min A for mobility (with and without RW) and ADLs. Verbal cues given for compensatory techniques with good carryover. Pt does not require further OT, recommend d/c to home without OT follow up.      Recommendations for follow up therapy are one component of a multi-disciplinary discharge planning process, led by the attending physician.  Recommendations may be updated based on patient status, additional functional criteria and insurance authorization.   Follow Up Recommendations  No OT follow up     Assistance Recommended at Discharge PRN  Patient can return home with the following A little help with walking and/or transfers;A little help with bathing/dressing/bathroom;Assist for transportation;Assistance with cooking/housework    Functional Status Assessment  Patient has had a recent decline in their functional status and demonstrates the ability to make significant improvements in function in a reasonable and predictable amount of time.  Equipment Recommendations  None recommended by OT    Recommendations for Other Services       Precautions / Restrictions Precautions Precautions: Fall;Cervical Precaution Booklet Issued: Yes (comment) Required Braces or Orthoses: Cervical Brace Cervical  Brace: Soft collar Restrictions Weight Bearing Restrictions: No      Mobility Bed Mobility Overal bed mobility: Needs Assistance Bed Mobility: Rolling, Sidelying to Sit Rolling: Supervision Sidelying to sit: Supervision       General bed mobility comments: cues for log roll    Transfers Overall transfer level: Needs assistance Equipment used: Rolling walker (2 wheels) Transfers: Sit to/from Stand Sit to Stand: Modified independent (Device/Increase time)           General transfer comment: with and without AD      Balance Overall balance assessment: Needs assistance Sitting-balance support: Feet supported Sitting balance-Leahy Scale: Good     Standing balance support: No upper extremity supported, During functional activity Standing balance-Leahy Scale: Fair                             ADL either performed or assessed with clinical judgement   ADL Overall ADL's : Needs assistance/impaired Eating/Feeding: Independent   Grooming: Supervision/safety;Standing   Upper Body Bathing: [name];Sitting   Lower Body Bathing: Supervison/ safety;Sit to/from stand   Upper Body Dressing : [name];Sitting   Lower Body Dressing: Supervision/safety;Sit to/from stand   Toilet Transfer: Supervision/safety   Toileting- Water quality scientist and Hygiene: Supervision/safety;Sit to/from stand;Sitting/lateral lean       Functional mobility during ADLs: Supervision/safety;Rolling walker (2 wheels) General ADL Comments: cues for cervical precuations with great carry over and understanding     Vision Baseline Vision/History: 1 Wears glasses Ability to See in Adequate Light: 0 Adequate Vision Assessment?: No apparent visual deficits     Perception Perception Perception Tested?: No   Praxis Praxis Praxis tested?: Not tested    Pertinent Vitals/Pain Pain Assessment Pain Assessment:  Faces Faces Pain Scale: Hurts little more Pain Location: back Pain  Descriptors / Indicators: Discomfort Pain Intervention(s): Limited activity within patient's tolerance, Monitored during session     Hand Dominance Right   Extremity/Trunk Assessment Upper Extremity Assessment Upper Extremity Assessment: Overall WFL for tasks assessed   Lower Extremity Assessment Lower Extremity Assessment: Overall WFL for tasks assessed   Cervical / Trunk Assessment Cervical / Trunk Assessment: Neck Surgery   Communication Communication Communication: No difficulties   Cognition Arousal/Alertness: Awake/alert Behavior During Therapy: WFL for tasks assessed/performed Overall Cognitive Status: Within Functional Limits for tasks assessed                                       General Comments  VSS on RA    Exercises     Shoulder Instructions      Home Living Family/patient expects to be discharged to:: Private residence Living Arrangements: Spouse/significant other Available Help at Discharge: Family;Available 24 hours/day Type of Home: House Home Access: Stairs to enter [employer] of Steps: 2 Entrance Stairs-Rails: None Home Layout: Two level;Bed/bath upstairs Alternate Level Stairs-Number of Steps: flight Alternate Level Stairs-Rails: Right;Left Bathroom Shower/Tub: Occupational psychologist: Standard Bathroom Accessibility: Yes   Home Equipment: Conservation officer, nature (2 wheels);BSC/3in1;Shower seat          Prior Functioning/Environment Prior Level of Function : Driving;Independent/Modified Independent             Mobility Comments: no AD ADLs Comments: indep        OT Problem List: Decreased strength;Decreased activity tolerance;Decreased range of motion;Decreased safety awareness;Decreased knowledge of use of DME or AE;Decreased knowledge of precautions      OT Treatment/Interventions:      OT Goals(Current goals can be found in the care plan section) Acute Rehab OT Goals Patient Stated Goal:  home OT Goal Formulation: With patient Time For Goal Achievement: 10/25/22 Potential to Achieve Goals: Good  OT Frequency:      Co-evaluation              AM-PAC OT "6 Clicks" Daily Activity     Outcome Measure Help from another person eating meals?: None Help from another person taking care of personal grooming?: A Little Help from another person toileting, which includes using toliet, bedpan, or urinal?: A Little Help from another person bathing (including washing, rinsing, drying)?: A Little Help from another person to put on and taking off regular upper body clothing?: None Help from another person to put on and taking off regular lower body clothing?: A Little 6 Click Score: 20   End of Session Equipment Utilized During Treatment: Rolling walker (2 wheels);Cervical collar Nurse Communication: Mobility status  Activity Tolerance: Patient tolerated treatment well Patient left: in chair;with call bell/phone within reach  OT Visit Diagnosis: Unsteadiness on feet (R26.81);Other abnormalities of gait and mobility (R26.89);Pain                Time: 0174-9449 OT Time Calculation (min): 18 min Charges:  OT General Charges $OT Visit: 1 Visit OT Evaluation $OT Eval Moderate Complexity: 1 Mod    Eliud Polo D Causey 10/11/2022, 10:33 AM

## 2022-10-11 NOTE — Progress Notes (Signed)
Patient alert and oriented, mae's well, voiding adequate amount of urine, swallowing without difficulty, no c/o pain at time of discharge. Patient discharged home with family. Script and discharged instructions given to patient. Patient and family stated understanding of instructions given. Patient has an appointment with Dr. Ellene Route in 1 week

## 2022-10-11 NOTE — Discharge Summary (Signed)
Physician Discharge Summary  Patient ID: Seth Schwartz MRN: [id number] DOB/AGE: 1936-04-04 87 y.o.  Admit date: 10/10/2022 Discharge date: 10/11/2022  Admission Diagnoses: Pseudoarthrosis C6-C7 with radiculopathy  Discharge Diagnoses: Due to arthrosis C6-C7.  History of fusion C2 to C7 Principal Problem:   Pseudoarthrosis of cervical spine [facility])   Discharged Condition: good  Hospital Course: Patient was admitted to undergo posterior cervical fusion at C6-C7 which she tolerated well.  Consults: None  Significant Diagnostic Studies: None  Treatments: surgery: Posterior fusion C6-C7  Discharge Exam: Blood pressure (!) 141/93, pulse 97, temperature 98.4 F (36.9 C), temperature source Oral, resp. rate 18, height '5\' 10"'$  (1.778 m), weight 93.4 kg, SpO2 97 %. Patient is alert and awake motor function is normal incision has some bleedthrough dressing changed today without difficulty.  Patient and gait are intact.  Disposition: Discharge disposition: 01-Home or Self Care       Discharge Instructions     Call MD for:  redness, tenderness, or signs of infection (pain, swelling, redness, odor or green/yellow discharge around incision site)   Complete by: As directed    Call MD for:  severe uncontrolled pain   Complete by: As directed    Call MD for:  temperature >100.4   Complete by: As directed    Diet - low sodium heart healthy   Complete by: As directed    Incentive spirometry RT   Complete by: As directed    Increase activity slowly   Complete by: As directed       Allergies as of 10/11/2022       Reactions   Sulfa Antibiotics Shortness Of Breath   Ace Inhibitors Cough   Penicillins Hives   Has patient had a PCN reaction causing immediate rash, facial/tongue/throat swelling, SOB or lightheadedness with hypotension: No Has patient had a PCN reaction causing severe rash involving mucus membranes or skin necrosis: Yes Has patient had a PCN reaction that required  hospitalization: No Has patient had a PCN reaction occurring within the last 10 years: No If all of the above answers are "NO", then may proceed with Cephalosporin use.        Medication List     TAKE these medications    acetaminophen 650 MG CR tablet Commonly known as: TYLENOL Take 650 mg by mouth every 8 (eight) hours as needed for pain.   amitriptyline 50 MG tablet Commonly known as: ELAVIL Take 50 mg by mouth at bedtime.   B COMPLEX-MINERALS PO Take 1 tablet by mouth daily.   budesonide-formoterol 160-4.5 MCG/ACT inhaler Commonly known as: SYMBICORT Inhale 2 puffs into the lungs daily as needed (wheezing or shortness of breath).   diazepam 5 MG tablet Commonly known as: Valium Take 1 tablet (5 mg total) by mouth every 6 (six) hours as needed for muscle spasms (PRN for muscle spasms). What changed:  how much to take when to take this   dorzolamide-timolol 2-0.5 % ophthalmic solution Commonly known as: COSOPT Place 1 drop into both eyes 2 (two) times daily.   EQL CoQ10 300 MG Caps Generic drug: Coenzyme Q10 Take 300 mg by mouth at bedtime.   fexofenadine 180 MG tablet Commonly known as: ALLEGRA Take 180 mg by mouth daily.   fluvoxaMINE 50 MG tablet Commonly known as: LUVOX Take 50 mg by mouth daily.   gabapentin 300 MG capsule Commonly known as: NEURONTIN Take 300 mg by mouth 5 (five) times daily.   HYDROcodone-acetaminophen 10-325 MG tablet Commonly known as: NORCO  Take 0.5 tablets by mouth 4 (four) times daily as needed for severe pain.   irbesartan-hydrochlorothiazide 150-12.5 MG tablet Commonly known as: AVALIDE Take 1 tablet by mouth daily.   lidocaine 4 % Place 1 patch onto the skin daily as needed (pain).   metFORMIN 500 MG tablet Commonly known as: GLUCOPHAGE Take 500 mg by mouth 2 (two) times daily with a meal.   multivitamin with minerals Tabs tablet Take 1 tablet by mouth daily.   omeprazole 40 MG capsule Commonly known as:  PRILOSEC Take 40 mg by mouth every evening.   oxyCODONE-acetaminophen 5-325 MG tablet Commonly known as: PERCOCET/ROXICET Take 1-2 tablets by mouth every 4 (four) hours as needed for moderate pain or severe pain.   polyethylene glycol 17 g packet Commonly known as: MIRALAX / GLYCOLAX Take 17 g by mouth daily as needed for moderate constipation.   rosuvastatin 5 MG tablet Commonly known as: CRESTOR Take 5 mg by mouth 3 (three) times a week. Mon Wed and Sat   tamsulosin 0.4 MG Caps capsule Commonly known as: FLOMAX Take 0.4 mg by mouth at bedtime.   testosterone cypionate 100 MG/ML injection Commonly known as: DEPOTESTOTERONE CYPIONATE Inject 100 mg into the muscle See admin instructions. Every 10 days   Vitamin D 50 MCG (2000 UT) tablet Take 2,000 Units by mouth daily.         Signed: Blanchie Dessert Dionicia Cerritos 10/11/2022, 9:24 AM

## 2022-10-13 ENCOUNTER — Encounter (HOSPITAL_COMMUNITY): Payer: Self-pay | Admitting: Neurological Surgery

## 2022-10-17 DIAGNOSIS — M47812 Spondylosis without myelopathy or radiculopathy, cervical region: Secondary | ICD-10-CM | POA: Diagnosis not present

## 2022-11-15 DIAGNOSIS — M47817 Spondylosis without myelopathy or radiculopathy, lumbosacral region: Secondary | ICD-10-CM | POA: Diagnosis not present

## 2022-11-15 DIAGNOSIS — Z6829 Body mass index (BMI) 29.0-29.9, adult: Secondary | ICD-10-CM | POA: Diagnosis not present

## 2022-11-15 DIAGNOSIS — M47812 Spondylosis without myelopathy or radiculopathy, cervical region: Secondary | ICD-10-CM | POA: Diagnosis not present

## 2023-01-10 DIAGNOSIS — N1831 Chronic kidney disease, stage 3a: Secondary | ICD-10-CM | POA: Diagnosis not present

## 2023-01-10 DIAGNOSIS — H409 Unspecified glaucoma: Secondary | ICD-10-CM | POA: Diagnosis not present

## 2023-01-10 DIAGNOSIS — N400 Enlarged prostate without lower urinary tract symptoms: Secondary | ICD-10-CM | POA: Diagnosis not present

## 2023-01-10 DIAGNOSIS — Z1211 Encounter for screening for malignant neoplasm of colon: Secondary | ICD-10-CM | POA: Diagnosis not present

## 2023-01-10 DIAGNOSIS — J4521 Mild intermittent asthma with (acute) exacerbation: Secondary | ICD-10-CM | POA: Diagnosis not present

## 2023-01-10 DIAGNOSIS — G8929 Other chronic pain: Secondary | ICD-10-CM | POA: Diagnosis not present

## 2023-01-10 DIAGNOSIS — M069 Rheumatoid arthritis, unspecified: Secondary | ICD-10-CM | POA: Diagnosis not present

## 2023-01-10 DIAGNOSIS — F339 Major depressive disorder, recurrent, unspecified: Secondary | ICD-10-CM | POA: Diagnosis not present

## 2023-01-10 DIAGNOSIS — E1165 Type 2 diabetes mellitus with hyperglycemia: Secondary | ICD-10-CM | POA: Diagnosis not present

## 2023-01-10 DIAGNOSIS — Z79899 Other long term (current) drug therapy: Secondary | ICD-10-CM | POA: Diagnosis not present

## 2023-01-10 DIAGNOSIS — I10 Essential (primary) hypertension: Secondary | ICD-10-CM | POA: Diagnosis not present

## 2023-01-10 DIAGNOSIS — E1122 Type 2 diabetes mellitus with diabetic chronic kidney disease: Secondary | ICD-10-CM | POA: Diagnosis not present

## 2023-01-12 DIAGNOSIS — M5412 Radiculopathy, cervical region: Secondary | ICD-10-CM | POA: Diagnosis not present

## 2023-01-12 DIAGNOSIS — Z6829 Body mass index (BMI) 29.0-29.9, adult: Secondary | ICD-10-CM | POA: Diagnosis not present

## 2023-01-18 DIAGNOSIS — H401111 Primary open-angle glaucoma, right eye, mild stage: Secondary | ICD-10-CM | POA: Diagnosis not present

## 2023-01-23 DIAGNOSIS — Z1211 Encounter for screening for malignant neoplasm of colon: Secondary | ICD-10-CM | POA: Diagnosis not present

## 2023-01-26 DIAGNOSIS — E291 Testicular hypofunction: Secondary | ICD-10-CM | POA: Diagnosis not present

## 2023-01-26 DIAGNOSIS — R972 Elevated prostate specific antigen [PSA]: Secondary | ICD-10-CM | POA: Diagnosis not present

## 2023-02-02 DIAGNOSIS — R3912 Poor urinary stream: Secondary | ICD-10-CM | POA: Diagnosis not present

## 2023-02-02 DIAGNOSIS — E291 Testicular hypofunction: Secondary | ICD-10-CM | POA: Diagnosis not present

## 2023-02-22 DIAGNOSIS — S299XXA Unspecified injury of thorax, initial encounter: Secondary | ICD-10-CM | POA: Diagnosis not present

## 2023-04-24 DIAGNOSIS — H401123 Primary open-angle glaucoma, left eye, severe stage: Secondary | ICD-10-CM | POA: Diagnosis not present

## 2023-05-09 DIAGNOSIS — N1831 Chronic kidney disease, stage 3a: Secondary | ICD-10-CM | POA: Diagnosis not present

## 2023-05-09 DIAGNOSIS — E782 Mixed hyperlipidemia: Secondary | ICD-10-CM | POA: Diagnosis not present

## 2023-05-09 DIAGNOSIS — E1165 Type 2 diabetes mellitus with hyperglycemia: Secondary | ICD-10-CM | POA: Diagnosis not present

## 2023-05-09 DIAGNOSIS — Z6828 Body mass index (BMI) 28.0-28.9, adult: Secondary | ICD-10-CM | POA: Diagnosis not present

## 2023-05-09 DIAGNOSIS — E1122 Type 2 diabetes mellitus with diabetic chronic kidney disease: Secondary | ICD-10-CM | POA: Diagnosis not present

## 2023-05-09 DIAGNOSIS — I10 Essential (primary) hypertension: Secondary | ICD-10-CM | POA: Diagnosis not present

## 2023-05-09 DIAGNOSIS — F411 Generalized anxiety disorder: Secondary | ICD-10-CM | POA: Diagnosis not present

## 2023-05-09 DIAGNOSIS — G9520 Unspecified cord compression: Secondary | ICD-10-CM | POA: Diagnosis not present

## 2023-06-05 DIAGNOSIS — E1142 Type 2 diabetes mellitus with diabetic polyneuropathy: Secondary | ICD-10-CM | POA: Diagnosis not present

## 2023-06-05 DIAGNOSIS — G8929 Other chronic pain: Secondary | ICD-10-CM | POA: Diagnosis not present

## 2023-06-05 DIAGNOSIS — M542 Cervicalgia: Secondary | ICD-10-CM | POA: Diagnosis not present

## 2023-06-05 DIAGNOSIS — Z1331 Encounter for screening for depression: Secondary | ICD-10-CM | POA: Diagnosis not present

## 2023-06-05 DIAGNOSIS — G9520 Unspecified cord compression: Secondary | ICD-10-CM | POA: Diagnosis not present

## 2023-06-05 DIAGNOSIS — Z9181 History of falling: Secondary | ICD-10-CM | POA: Diagnosis not present

## 2023-06-05 DIAGNOSIS — Z6829 Body mass index (BMI) 29.0-29.9, adult: Secondary | ICD-10-CM | POA: Diagnosis not present

## 2023-06-05 DIAGNOSIS — Z0000 Encounter for general adult medical examination without abnormal findings: Secondary | ICD-10-CM | POA: Diagnosis not present

## 2023-07-05 DIAGNOSIS — Z23 Encounter for immunization: Secondary | ICD-10-CM | POA: Diagnosis not present

## 2023-07-16 DIAGNOSIS — E119 Type 2 diabetes mellitus without complications: Secondary | ICD-10-CM | POA: Diagnosis not present

## 2023-09-04 DIAGNOSIS — Z13228 Encounter for screening for other metabolic disorders: Secondary | ICD-10-CM | POA: Diagnosis not present

## 2023-09-18 ENCOUNTER — Other Ambulatory Visit: Payer: Self-pay | Admitting: Neurological Surgery

## 2023-09-18 DIAGNOSIS — G8929 Other chronic pain: Secondary | ICD-10-CM | POA: Diagnosis not present

## 2023-09-18 DIAGNOSIS — M546 Pain in thoracic spine: Secondary | ICD-10-CM | POA: Diagnosis not present

## 2023-10-04 ENCOUNTER — Ambulatory Visit
Admission: RE | Admit: 2023-10-04 | Discharge: 2023-10-04 | Disposition: A | Discharge status: Home / Self Care | Payer: PPO | Source: Ambulatory Visit | Attending: Neurological Surgery | Admitting: Neurological Surgery

## 2023-10-04 DIAGNOSIS — M4804 Spinal stenosis, thoracic region: Secondary | ICD-10-CM | POA: Diagnosis not present

## 2023-10-04 DIAGNOSIS — G8929 Other chronic pain: Secondary | ICD-10-CM

## 2023-10-04 DIAGNOSIS — Z981 Arthrodesis status: Secondary | ICD-10-CM | POA: Diagnosis not present

## 2023-12-18 ENCOUNTER — Other Ambulatory Visit: Payer: Self-pay | Admitting: Neurological Surgery

## 2023-12-18 DIAGNOSIS — M4714 Other spondylosis with myelopathy, thoracic region: Secondary | ICD-10-CM

## 2024-01-10 DIAGNOSIS — H401123 Primary open-angle glaucoma, left eye, severe stage: Secondary | ICD-10-CM | POA: Diagnosis not present

## 2024-01-11 DIAGNOSIS — M5134 Other intervertebral disc degeneration, thoracic region: Secondary | ICD-10-CM | POA: Diagnosis not present

## 2024-01-11 DIAGNOSIS — M4804 Spinal stenosis, thoracic region: Secondary | ICD-10-CM | POA: Diagnosis not present

## 2024-01-11 DIAGNOSIS — M2578 Osteophyte, vertebrae: Secondary | ICD-10-CM | POA: Diagnosis not present

## 2024-01-18 DIAGNOSIS — M4714 Other spondylosis with myelopathy, thoracic region: Secondary | ICD-10-CM | POA: Diagnosis not present

## 2024-01-18 DIAGNOSIS — Z6828 Body mass index (BMI) 28.0-28.9, adult: Secondary | ICD-10-CM | POA: Diagnosis not present

## 2024-02-11 DIAGNOSIS — H93292 Other abnormal auditory perceptions, left ear: Secondary | ICD-10-CM | POA: Diagnosis not present

## 2024-02-11 DIAGNOSIS — H6123 Impacted cerumen, bilateral: Secondary | ICD-10-CM | POA: Diagnosis not present

## 2024-02-27 DIAGNOSIS — Z6828 Body mass index (BMI) 28.0-28.9, adult: Secondary | ICD-10-CM | POA: Diagnosis not present

## 2024-02-27 DIAGNOSIS — M79672 Pain in left foot: Secondary | ICD-10-CM | POA: Diagnosis not present

## 2024-02-27 DIAGNOSIS — L600 Ingrowing nail: Secondary | ICD-10-CM | POA: Diagnosis not present

## 2024-03-12 ENCOUNTER — Encounter: Payer: Self-pay | Admitting: Podiatry

## 2024-03-12 ENCOUNTER — Ambulatory Visit: Admitting: Podiatry

## 2024-03-12 DIAGNOSIS — E1142 Type 2 diabetes mellitus with diabetic polyneuropathy: Secondary | ICD-10-CM

## 2024-03-12 DIAGNOSIS — M79674 Pain in right toe(s): Secondary | ICD-10-CM | POA: Diagnosis not present

## 2024-03-12 DIAGNOSIS — M79675 Pain in left toe(s): Secondary | ICD-10-CM

## 2024-03-12 DIAGNOSIS — B351 Tinea unguium: Secondary | ICD-10-CM | POA: Diagnosis not present

## 2024-03-12 NOTE — Progress Notes (Signed)
 Subjective:  Patient ID: Seth Schwartz, male    DOB: [birth year],   MRN: [id number]  Chief Complaint  Patient presents with   Nail Problem    RM 21 Patient is here for possible ingrown toe nail of the left hallux and left foot pain with tingling and numbness. (Diabetic neuropathy) Pt states pain presented over the last 8 years.   Right Foot- Nail discoloration, patient used an otc "fungi" nail treatment.    88 y.o. male presents for concern of thickened elongated and painful nails that are difficult to trim. Requesting to have them trimmed today. Relates burning and tingling in their feet. Patient is diabetic and last A1c was  Lab Results  Component Value Date   HGBA1C 7.4 (H) 07/22/2020   .   PCP:  Arlys Berke, MD    . Denies any other pedal complaints. Denies n/v/f/c.   Past Medical History:  Diagnosis Date   Allergic rhinitis    Anxiety    takes Valium  prn   Arthritis    spinal DDD, stenosis   Asthma    Chronic low back pain    Complication of anesthesia    [name] AFTER BEING GIVEN " TWILIGHT SLEEP"    Constipation    takes Benefiber and stool softener daily   Depression    takes Luvox  daily   Diabetes (HCC)    TYPE 2    Dizziness    LAST SPELL WAS 45 DAYS AGO; REPORTS LASTING 5 MINUTES AND BACK TO HIS NORMAL RIGHT AFTER ; DENIES BLACKOUTS; HAD A CT OF HEAD THAT ONLY  SHOWED A SMALL NODULE THAT IS TO BE MONITORED NOW     ED (erectile dysfunction)    Dr. Gordy Lauber   Enlarged prostate    takes Uroxatral  nightly   GERD (gastroesophageal reflux disease)    TAKES RANITIDINE AND PAPAYA  TO TREAT    Glaucoma    uses eye drops daily   H/O hiatal hernia    History of cataract    History of colon polyps    History of gastric ulcer 1967   History of kidney stones    lithotripsyx1   History of rectal bleeding    History of stress test 2010   told wnl, on treadmill   Hypercalcemia    Hyperlipidemia    takes Cholestoff at night and Fish Oil bid    Hyperparathyroidism (HCC)    Hypertension    takes Losartan  - takes in a.m. , not referred to cardiac, had stress test 6-7 yrs. ago, told wnl   Insomnia    takes Trazodone  nightly   Low testosterone     Dr. Gordy Lauber   Migraine    r/t cervical issues, occasionally has severe headache & takes NSAID & Benadryl    Multiple nevi    Dr. Micael Adas   Neuromuscular disorder (HCC)    Neuropathy    Nocturia    OA (osteoarthritis)    Peyronie disease    Dr. Gordy Lauber   Pneumonia    as a baby   Skin spots-aging    Urinary frequency    Urinary urgency    Weakness    left leg and both hands    Objective:  Physical Exam: Vascular: DP/PT pulses 2/4 bilateral. CFT <3 seconds. Absent hair growth on digits. Edema noted to bilateral lower extremities. Xerosis noted bilaterally.  Skin. No lacerations or abrasions bilateral feet. Nails 1-5 bilateral  are thickened discolored and elongated with subungual debris.  Musculoskeletal: MMT 5/5 bilateral lower extremities in DF, PF, Inversion and Eversion. Deceased ROM in DF of ankle joint.  Neurological: Sensation intact to light touch. Protective sensation diminished bilateral.    Assessment:   1. Pain due to onychomycosis of toenails of both feet   2. Type 2 diabetes mellitus with peripheral neuropathy (HCC)      Plan:  Patient was evaluated and treated and all questions answered. -Discussed and educated patient on diabetic foot care, especially with  regards to the vascular, neurological and musculoskeletal systems.  -Stressed the importance of good glycemic control and the detriment of not  controlling glucose levels in relation to the foot. -Discussed supportive shoes at all times and checking feet regularly.  -Mechanically debrided all nails 1-5 bilateral using sterile nail nipper and filed with dremel without incident  -Answered all patient questions -Patient to return  in 3 months for at risk foot care -Patient advised to call the office if  any problems or questions arise in the meantime.   Jennefer Moats, DPM

## 2024-03-24 DIAGNOSIS — E291 Testicular hypofunction: Secondary | ICD-10-CM | POA: Diagnosis not present

## 2024-03-31 DIAGNOSIS — N411 Chronic prostatitis: Secondary | ICD-10-CM | POA: Diagnosis not present

## 2024-03-31 DIAGNOSIS — E291 Testicular hypofunction: Secondary | ICD-10-CM | POA: Diagnosis not present

## 2024-03-31 DIAGNOSIS — R3912 Poor urinary stream: Secondary | ICD-10-CM | POA: Diagnosis not present

## 2024-04-15 DIAGNOSIS — E1165 Type 2 diabetes mellitus with hyperglycemia: Secondary | ICD-10-CM | POA: Diagnosis not present

## 2024-04-15 DIAGNOSIS — H401123 Primary open-angle glaucoma, left eye, severe stage: Secondary | ICD-10-CM | POA: Diagnosis not present

## 2024-04-15 DIAGNOSIS — D649 Anemia, unspecified: Secondary | ICD-10-CM | POA: Diagnosis not present

## 2024-04-15 DIAGNOSIS — I10 Essential (primary) hypertension: Secondary | ICD-10-CM | POA: Diagnosis not present

## 2024-04-15 DIAGNOSIS — E1142 Type 2 diabetes mellitus with diabetic polyneuropathy: Secondary | ICD-10-CM | POA: Diagnosis not present

## 2024-04-22 DIAGNOSIS — D509 Iron deficiency anemia, unspecified: Secondary | ICD-10-CM | POA: Diagnosis not present

## 2024-04-22 DIAGNOSIS — E1142 Type 2 diabetes mellitus with diabetic polyneuropathy: Secondary | ICD-10-CM | POA: Diagnosis not present

## 2024-04-22 DIAGNOSIS — G8929 Other chronic pain: Secondary | ICD-10-CM | POA: Diagnosis not present

## 2024-04-22 DIAGNOSIS — E782 Mixed hyperlipidemia: Secondary | ICD-10-CM | POA: Diagnosis not present

## 2024-04-22 DIAGNOSIS — K219 Gastro-esophageal reflux disease without esophagitis: Secondary | ICD-10-CM | POA: Diagnosis not present

## 2024-04-22 DIAGNOSIS — Z1331 Encounter for screening for depression: Secondary | ICD-10-CM | POA: Diagnosis not present

## 2024-04-22 DIAGNOSIS — I10 Essential (primary) hypertension: Secondary | ICD-10-CM | POA: Diagnosis not present

## 2024-04-22 DIAGNOSIS — E291 Testicular hypofunction: Secondary | ICD-10-CM | POA: Diagnosis not present

## 2024-06-05 DIAGNOSIS — Z23 Encounter for immunization: Secondary | ICD-10-CM | POA: Diagnosis not present

## 2024-06-05 DIAGNOSIS — Z0000 Encounter for general adult medical examination without abnormal findings: Secondary | ICD-10-CM | POA: Diagnosis not present

## 2024-06-11 ENCOUNTER — Encounter: Payer: Self-pay | Admitting: Podiatry

## 2024-06-11 ENCOUNTER — Ambulatory Visit: Admitting: Podiatry

## 2024-06-11 DIAGNOSIS — B351 Tinea unguium: Secondary | ICD-10-CM

## 2024-06-11 DIAGNOSIS — E1142 Type 2 diabetes mellitus with diabetic polyneuropathy: Secondary | ICD-10-CM

## 2024-06-11 DIAGNOSIS — M79675 Pain in left toe(s): Secondary | ICD-10-CM

## 2024-06-11 DIAGNOSIS — M79674 Pain in right toe(s): Secondary | ICD-10-CM

## 2024-06-11 NOTE — Progress Notes (Signed)
 Subjective:  Patient ID: Seth Schwartz, male    DOB: 1936-05-16,   MRN: [id number]  Chief Complaint  Patient presents with   Diabetes    I want my toenails done.  I want to talk to her about my Neuropathy.  Saw Artist Cohens - 04/15/2024; A1c 7.4    88 y.o. male presents for concern of thickened elongated and painful nails that are difficult to trim. Requesting to have them trimmed today. Relates burning and tingling in their feet. Patient is diabetic and last A1c was  Lab Results  Component Value Date   HGBA1C 7.4 (H) 07/22/2020   .   PCP:  Cohens Artist PARAS, MD    . Denies any other pedal complaints. Denies n/v/f/c.   Past Medical History:  Diagnosis Date   Allergic rhinitis    Anxiety    takes Valium  prn   Arthritis    spinal DDD, stenosis   Asthma    Chronic low back pain    Complication of anesthesia    [name] AFTER BEING GIVEN  TWILIGHT SLEEP    Constipation    takes Benefiber and stool softener daily   Depression    takes Luvox  daily   Diabetes (HCC)    TYPE 2    Dizziness    LAST SPELL WAS 45 DAYS AGO; REPORTS LASTING 5 MINUTES AND BACK TO HIS NORMAL RIGHT AFTER ; DENIES BLACKOUTS; HAD A CT OF HEAD THAT ONLY  SHOWED A SMALL NODULE THAT IS TO BE MONITORED NOW     ED (erectile dysfunction)    Dr. Eliza   Enlarged prostate    takes Uroxatral  nightly   GERD (gastroesophageal reflux disease)    TAKES RANITIDINE AND PAPAYA  TO TREAT    Glaucoma    uses eye drops daily   H/O hiatal hernia    History of cataract    History of colon polyps    History of gastric ulcer 1967   History of kidney stones    lithotripsyx1   History of rectal bleeding    History of stress test 2010   told wnl, on treadmill   Hypercalcemia    Hyperlipidemia    takes Cholestoff at night and Fish Oil bid   Hyperparathyroidism (HCC)    Hypertension    takes Losartan  - takes in a.m. , not referred to cardiac, had stress test 6-7 yrs. ago, told wnl   Insomnia     takes Trazodone  nightly   Low testosterone     Dr. Eliza   Migraine    r/t cervical issues, occasionally has severe headache & takes NSAID & Benadryl    Multiple nevi    Dr. Shlomo   Neuromuscular disorder (HCC)    Neuropathy    Nocturia    OA (osteoarthritis)    Peyronie disease    Dr. Eliza   Pneumonia    as a baby   Skin spots-aging    Urinary frequency    Urinary urgency    Weakness    left leg and both hands    Objective:  Physical Exam: Vascular: DP/PT pulses 2/4 bilateral. CFT <3 seconds. Absent hair growth on digits. Edema noted to bilateral lower extremities. Xerosis noted bilaterally.  Skin. No lacerations or abrasions bilateral feet. Nails 1-5 bilateral  are thickened discolored and elongated with subungual debris.  Musculoskeletal: MMT 5/5 bilateral lower extremities in DF, PF, Inversion and Eversion. Deceased ROM in DF of ankle joint.  Neurological: Sensation intact to  light touch. Protective sensation diminished bilateral.    Assessment:   1. Pain due to onychomycosis of toenails of both feet   2. Type 2 diabetes mellitus with peripheral neuropathy (HCC)      Plan:  Patient was evaluated and treated and all questions answered. -Discussed and educated patient on diabetic foot care, especially with  regards to the vascular, neurological and musculoskeletal systems.  -Stressed the importance of good glycemic control and the detriment of not  controlling glucose levels in relation to the foot. -Discussed supportive shoes at all times and checking feet regularly.  -Mechanically debrided all nails 1-5 bilateral using sterile nail nipper and filed with dremel without incident  -Answered all patient questions -Patient to return  in 3 months for at risk foot care -Patient advised to call the office if any problems or questions arise in the meantime.   Asberry Failing, DPM

## 2024-07-16 DIAGNOSIS — E119 Type 2 diabetes mellitus without complications: Secondary | ICD-10-CM | POA: Diagnosis not present

## 2024-07-29 ENCOUNTER — Other Ambulatory Visit: Payer: Self-pay

## 2024-07-29 ENCOUNTER — Ambulatory Visit: Admitting: Orthopaedic Surgery

## 2024-07-29 DIAGNOSIS — M1612 Unilateral primary osteoarthritis, left hip: Secondary | ICD-10-CM | POA: Diagnosis not present

## 2024-07-29 MED ORDER — METHYLPREDNISOLONE 4 MG PO TBPK: ORAL_TABLET | ORAL | 0 refills | Status: DC

## 2024-07-29 NOTE — Progress Notes (Signed)
 Office Visit Note   Patient: Seth Schwartz           Date of Birth: 10/01/1936           MRN: [id number] Visit Date: 07/29/2024              Requested by: Delayne Artist PARAS, MD [address] [place],  [place] [address] PCP: Delayne Artist PARAS, MD   Assessment & Plan: Visit Diagnoses:  1. Primary osteoarthritis of left hip     Plan: History of Present Illness Seth Schwartz is an 88 year old male with a history of arthritis who presents with left hip pain.  He experiences pain in the groin and around the side of his left hip. The symptoms have progressed over time, initially affecting his foot due to a bunion, then his knee, and subsequently his hip. Previous x-rays show subchondral cysts without bone-on-bone contact.  Physical Exam MUSCULOSKELETAL: No tenderness on the side of the hip on palpation. Pain on flexion and rotation of the hip.  Results RADIOLOGY Hip X-ray: Osteoarthritis with subchondral cysts, almost bone-on-bone contact  Assessment and Plan Left hip osteoarthritis with associated pain Chronic left hip osteoarthritis with groin and lateral hip pain. X-rays show arthritis with subchondral cysts, no bone-on-bone contact. Pain exacerbated by hip flexion and rotation. - Prescribed Medrol  Dosepak for fewer side effects than prednisone. - Advised antihistamine at night to counteract sleep disturbances from Medrol  Dosepak. - If pain persists, refer to Dr. Burnetta for steroid injection under ultrasound guidance.  Follow-Up Instructions: No follow-ups on file.   Orders:  Orders Placed This Encounter  Procedures   XR HIP UNILAT W OR W/O PELVIS 2-3 VIEWS LEFT   Meds ordered this encounter  Medications   methylPREDNISolone  (MEDROL  DOSEPAK) 4 MG TBPK tablet    Sig: Take as directed    Dispense:  21 tablet    Refill:  0      Procedures: No procedures performed   Clinical Data: No additional findings.   Subjective: Chief Complaint  Patient presents with    Left Hip - Pain    HPI  Review of Systems  Constitutional: Negative.   HENT: Negative.    Eyes: Negative.   Respiratory: Negative.    Cardiovascular: Negative.   Gastrointestinal: Negative.   Endocrine: Negative.   Genitourinary: Negative.   Skin: Negative.   Allergic/Immunologic: Negative.   Neurological: Negative.   Hematological: Negative.   Psychiatric/Behavioral: Negative.    All other systems reviewed and are negative.    Objective: Vital Signs: There were no vitals taken for this visit.  Physical Exam Vitals and nursing note reviewed.  Constitutional:      Appearance: He is well-developed.  HENT:     Head: Normocephalic and atraumatic.  Eyes:     Pupils: Pupils are equal, round, and reactive to light.  Pulmonary:     Effort: Pulmonary effort is normal.  Abdominal:     Palpations: Abdomen is soft.  Musculoskeletal:        General: Normal range of motion.     Cervical back: Neck supple.  Skin:    General: Skin is warm.  Neurological:     Mental Status: He is alert and oriented to person, place, and time.  Psychiatric:        Behavior: Behavior normal.        Thought Content: Thought content normal.        Judgment: Judgment normal.     Ortho  Exam  Specialty Comments:  No specialty comments available.  Imaging: XR HIP UNILAT W OR W/O PELVIS 2-3 VIEWS LEFT Result Date: 07/29/2024 Xrays of the left hip show advanced osteoarthritis with near bone-on-bone joint space narrowing.     PMFS History: Patient Active Problem List   Diagnosis Date Noted   Primary osteoarthritis of left hip 07/29/2024   Cervical arthritis with myelopathy 07/26/2020   Primary hyperparathyroidism 02/15/2018   Hyperparathyroidism, primary 02/10/2018   Stenosis of gastric pouch as complication of bariatric surgery 04/23/2015   Pseudoarthrosis of cervical spine (HCC) 06/02/2014   Past Medical History:  Diagnosis Date   Allergic rhinitis    Anxiety    takes Valium  prn    Arthritis    spinal DDD, stenosis   Asthma    Chronic low back pain    Complication of anesthesia    [name] AFTER BEING GIVEN  TWILIGHT SLEEP    Constipation    takes Benefiber and stool softener daily   Depression    takes Luvox  daily   Diabetes (HCC)    TYPE 2    Dizziness    LAST SPELL WAS 45 DAYS AGO; REPORTS LASTING 5 MINUTES AND BACK TO HIS NORMAL RIGHT AFTER ; DENIES BLACKOUTS; HAD A CT OF HEAD THAT ONLY  SHOWED A SMALL NODULE THAT IS TO BE MONITORED NOW     ED (erectile dysfunction)    Dr. Eliza   Enlarged prostate    takes Uroxatral  nightly   GERD (gastroesophageal reflux disease)    TAKES RANITIDINE AND PAPAYA  TO TREAT    Glaucoma    uses eye drops daily   H/O hiatal hernia    History of cataract    History of colon polyps    History of gastric ulcer 1967   History of kidney stones    lithotripsyx1   History of rectal bleeding    History of stress test 2010   told wnl, on treadmill   Hypercalcemia    Hyperlipidemia    takes Cholestoff at night and Fish Oil bid   Hyperparathyroidism    Hypertension    takes Losartan  - takes in a.m. , not referred to cardiac, had stress test 6-7 yrs. ago, told wnl   Insomnia    takes Trazodone  nightly   Low testosterone     Dr. Eliza   Migraine    r/t cervical issues, occasionally has severe headache & takes NSAID & Benadryl    Multiple nevi    Dr. Shlomo   Neuromuscular disorder (HCC)    Neuropathy    Nocturia    OA (osteoarthritis)    Peyronie disease    Dr. Eliza   Pneumonia    as a baby   Skin spots-aging    Urinary frequency    Urinary urgency    Weakness    left leg and both hands    No family history on file.  Past Surgical History:  Procedure Laterality Date   ANTERIOR CERVICAL DECOMP/DISCECTOMY FUSION N/A 12/17/2012   Procedure: ANTERIOR CERVICAL DECOMPRESSION/DISCECTOMY FUSION 2 LEVELS;  Surgeon: Victory Gens, MD;  Location: [facility];  Service: Neurosurgery;  Laterality:  N/A;  Cervical three-four Anterior cervical decompression/diskectomy/fusion, with anterior revision of Cervical six-seven pseudoarthrosis   ANTERIOR CERVICAL DECOMP/DISCECTOMY FUSION  1998   ANTERIOR CERVICAL DECOMP/DISCECTOMY FUSION N/A 06/02/2014   Procedure: Cervical three-four, Cervical six-seven  Anterior Cervical Decompression/diskectomy/fusion with Iliac crest bone graft;  Surgeon: Victory Gens, MD;  Location: [facility];  Service: Neurosurgery;  Laterality: N/A;   ANTERIOR LUMBAR FUSION N/A 04/01/2013   Procedure: ANTERIOR LUMBAR FUSION 1 LEVEL;  Surgeon: Victory Gens, MD;  Location: [facility];  Service: Neurosurgery;  Laterality: N/A;  Lumbar five-Sacral One Anterior lumbar interbody fusion   BACK SURGERY  2000/2002/2007   3 lumbars, 1 cervical  with fusion   COLONOSCOPY     ESOPHAGOGASTRODUODENOSCOPY  4/15   esophagus stretched   EYE SURGERY Bilateral 2008   bilateral cataracts removed, 02/2015 & 6/ 2016,  glaucoma has improved    HEMORRHOID SURGERY  2009   x 2   LITHOTRIPSY     PARATHYROID  EXPLORATION N/A 02/15/2018   Procedure: NECK EXPLORATION WITH RIGHT INFERIOR PARATHYROIDECTOMY AND BIOPSY OF LEFT INFERIOR PARATHYROID ;  Surgeon: Eletha Boas, MD;  Location: [facility];  Service: General;  Laterality: N/A;   POSTERIOR CERVICAL FUSION/FORAMINOTOMY N/A 07/26/2020   Procedure: Cervical Two-Three Posterior cervical fusion with lateral mass fixation;  Surgeon: Gens Victory, MD;  Location: [facility];  Service: Neurosurgery;  Laterality: N/A;  posterior   POSTERIOR CERVICAL FUSION/FORAMINOTOMY N/A 10/10/2022   Procedure: Cervical six-seven Posterior cervical fusion with lateral mass fixation;  Surgeon: Gens Victory, MD;  Location: [facility];  Service: Neurosurgery;  Laterality: N/A;   TRIGGER FINGER RELEASE Left 4-23yrs ago   middle finger   TRIGGER FINGER RELEASE Left    Social History   Occupational History   Not on file  Tobacco Use   Smoking status: Former    Current packs/day: 0.00     Average packs/day: 0.3 packs/day for 25.0 years (6.3 ttl pk-yrs)    Types: Cigarettes    Start date: 05/27/1954    Quit date: 05/28/1979    Years since quitting: 45.2   Smokeless tobacco: Former    Types: Chew, Snuff    Quit date: 1980   Tobacco comments:    quit about 55yrs ago  Vaping Use   Vaping status: Never Used  Substance and Sexual Activity   Alcohol  use: No    Comment: 10/06/22- none in 40 years.   Drug use: No   Sexual activity: Not Currently

## 2024-08-04 DIAGNOSIS — R399 Unspecified symptoms and signs involving the genitourinary system: Secondary | ICD-10-CM | POA: Diagnosis not present

## 2024-08-04 DIAGNOSIS — E291 Testicular hypofunction: Secondary | ICD-10-CM | POA: Diagnosis not present

## 2024-08-11 ENCOUNTER — Encounter: Payer: Self-pay | Admitting: Radiology

## 2024-08-12 DIAGNOSIS — I959 Hypotension, unspecified: Secondary | ICD-10-CM | POA: Diagnosis not present

## 2024-08-12 DIAGNOSIS — I10 Essential (primary) hypertension: Secondary | ICD-10-CM | POA: Diagnosis not present

## 2024-08-12 DIAGNOSIS — E291 Testicular hypofunction: Secondary | ICD-10-CM | POA: Diagnosis not present

## 2024-08-12 DIAGNOSIS — E1165 Type 2 diabetes mellitus with hyperglycemia: Secondary | ICD-10-CM | POA: Diagnosis not present

## 2024-08-12 DIAGNOSIS — R231 Pallor: Secondary | ICD-10-CM | POA: Diagnosis not present

## 2024-08-12 DIAGNOSIS — R5383 Other fatigue: Secondary | ICD-10-CM | POA: Diagnosis not present

## 2024-08-12 DIAGNOSIS — E1142 Type 2 diabetes mellitus with diabetic polyneuropathy: Secondary | ICD-10-CM | POA: Diagnosis not present

## 2024-09-02 DIAGNOSIS — R490 Dysphonia: Secondary | ICD-10-CM | POA: Diagnosis not present

## 2024-09-02 DIAGNOSIS — E291 Testicular hypofunction: Secondary | ICD-10-CM | POA: Diagnosis not present

## 2024-09-02 DIAGNOSIS — I10 Essential (primary) hypertension: Secondary | ICD-10-CM | POA: Diagnosis not present

## 2024-09-10 ENCOUNTER — Encounter: Payer: Self-pay | Admitting: Podiatry

## 2024-09-10 ENCOUNTER — Ambulatory Visit: Admitting: Podiatry

## 2024-09-10 DIAGNOSIS — M79675 Pain in left toe(s): Secondary | ICD-10-CM

## 2024-09-10 DIAGNOSIS — M79674 Pain in right toe(s): Secondary | ICD-10-CM

## 2024-09-10 DIAGNOSIS — B351 Tinea unguium: Secondary | ICD-10-CM | POA: Diagnosis not present

## 2024-09-10 DIAGNOSIS — E1142 Type 2 diabetes mellitus with diabetic polyneuropathy: Secondary | ICD-10-CM

## 2024-09-10 NOTE — Progress Notes (Signed)
 Subjective:  Patient ID: Seth Schwartz, male    DOB: 12/28/1935,   MRN: [id number]  Chief Complaint  Patient presents with   Diabetes    Trim my toenails. Saw Dr. Artist Cohens - 06/05/2024; A1c - 7.0       88 y.o. male presents for concern of thickened elongated and painful nails that are difficult to trim. Requesting to have them trimmed today. Relates burning and tingling in their feet. Patient is diabetic and last A1c was  Lab Results  Component Value Date   HGBA1C 7.4 (H) 07/22/2020   .   PCP:  Cohens Artist PARAS, MD    . Denies any other pedal complaints. Denies n/v/f/c.   Past Medical History:  Diagnosis Date   Allergic rhinitis    Anxiety    takes Valium  prn   Arthritis    spinal DDD, stenosis   Asthma    Chronic low back pain    Complication of anesthesia    [name] AFTER BEING GIVEN  TWILIGHT SLEEP    Constipation    takes Benefiber and stool softener daily   Depression    takes Luvox  daily   Diabetes (HCC)    TYPE 2    Dizziness    LAST SPELL WAS 45 DAYS AGO; REPORTS LASTING 5 MINUTES AND BACK TO HIS NORMAL RIGHT AFTER ; DENIES BLACKOUTS; HAD A CT OF HEAD THAT ONLY  SHOWED A SMALL NODULE THAT IS TO BE MONITORED NOW     ED (erectile dysfunction)    Dr. Eliza   Enlarged prostate    takes Uroxatral  nightly   GERD (gastroesophageal reflux disease)    TAKES RANITIDINE AND PAPAYA  TO TREAT    Glaucoma    uses eye drops daily   H/O hiatal hernia    History of cataract    History of colon polyps    History of gastric ulcer 1967   History of kidney stones    lithotripsyx1   History of rectal bleeding    History of stress test 2010   told wnl, on treadmill   Hypercalcemia    Hyperlipidemia    takes Cholestoff at night and Fish Oil bid   Hyperparathyroidism    Hypertension    takes Losartan  - takes in a.m. , not referred to cardiac, had stress test 6-7 yrs. ago, told wnl   Insomnia    takes Trazodone  nightly   Low testosterone      Dr. Eliza   Migraine    r/t cervical issues, occasionally has severe headache & takes NSAID & Benadryl    Multiple nevi    Dr. Shlomo   Neuromuscular disorder (HCC)    Neuropathy    Nocturia    OA (osteoarthritis)    Peyronie disease    Dr. Eliza   Pneumonia    as a baby   Skin spots-aging    Urinary frequency    Urinary urgency    Weakness    left leg and both hands    Objective:  Physical Exam: Vascular: DP/PT pulses 2/4 bilateral. CFT <3 seconds. Absent hair growth on digits. Edema noted to bilateral lower extremities. Xerosis noted bilaterally.  Skin. No lacerations or abrasions bilateral feet. Nails 1-5 bilateral  are thickened discolored and elongated with subungual debris.  Musculoskeletal: MMT 5/5 bilateral lower extremities in DF, PF, Inversion and Eversion. Deceased ROM in DF of ankle joint.  Neurological: Sensation intact to light touch. Protective sensation diminished bilateral.  Assessment:   No diagnosis found.    Plan:  Patient was evaluated and treated and all questions answered. -Discussed and educated patient on diabetic foot care, especially with  regards to the vascular, neurological and musculoskeletal systems.  -Stressed the importance of good glycemic control and the detriment of not  controlling glucose levels in relation to the foot. -Discussed supportive shoes at all times and checking feet regularly.  -Mechanically debrided all nails 1-5 bilateral using sterile nail nipper and filed with dremel without incident  -Answered all patient questions -Patient to return  in 3 months for at risk foot care -Patient advised to call the office if any problems or questions arise in the meantime.   Asberry Failing, DPM

## 2024-10-26 ENCOUNTER — Other Ambulatory Visit: Payer: Self-pay | Admitting: Orthopaedic Surgery

## 2025-01-13 ENCOUNTER — Ambulatory Visit: Admitting: Podiatry
